# Patient Record
Sex: Female | Born: 1960 | Race: White | Hispanic: No | Marital: Married | Smoking: Current every day smoker
Health system: Southern US, Community
[De-identification: ages and names within clinical notes are randomized; demographics above are authoritative.]

## PROBLEM LIST (undated history)

## (undated) DIAGNOSIS — Z87442 Personal history of urinary calculi: Secondary | ICD-10-CM

## (undated) DIAGNOSIS — M1990 Unspecified osteoarthritis, unspecified site: Secondary | ICD-10-CM

## (undated) DIAGNOSIS — J45909 Unspecified asthma, uncomplicated: Secondary | ICD-10-CM

## (undated) DIAGNOSIS — N189 Chronic kidney disease, unspecified: Secondary | ICD-10-CM

## (undated) DIAGNOSIS — D649 Anemia, unspecified: Secondary | ICD-10-CM

## (undated) DIAGNOSIS — G8929 Other chronic pain: Secondary | ICD-10-CM

## (undated) HISTORY — DX: Other chronic pain: G89.29

## (undated) HISTORY — DX: Anemia, unspecified: D64.9

## (undated) HISTORY — PX: CHOLECYSTECTOMY: SHX55

## (undated) HISTORY — PX: OTHER SURGICAL HISTORY: SHX169

## (undated) HISTORY — DX: Unspecified osteoarthritis, unspecified site: M19.90

## (undated) HISTORY — PX: COLONOSCOPY: SHX174

## (undated) HISTORY — DX: Chronic kidney disease, unspecified: N18.9

---

## 1983-03-20 HISTORY — PX: OTHER SURGICAL HISTORY: SHX169

## 2004-06-15 ENCOUNTER — Emergency Department: Payer: Self-pay | Admitting: Emergency Medicine

## 2004-10-16 ENCOUNTER — Emergency Department: Payer: Self-pay | Admitting: Unknown Physician Specialty

## 2004-10-16 ENCOUNTER — Other Ambulatory Visit: Payer: Self-pay

## 2006-08-23 ENCOUNTER — Inpatient Hospital Stay: Payer: Self-pay | Admitting: Unknown Physician Specialty

## 2008-02-16 ENCOUNTER — Ambulatory Visit: Payer: Self-pay | Admitting: Family Medicine

## 2008-03-01 ENCOUNTER — Ambulatory Visit: Payer: Self-pay | Admitting: Family Medicine

## 2008-06-12 ENCOUNTER — Emergency Department: Payer: Self-pay | Admitting: Emergency Medicine

## 2010-08-11 ENCOUNTER — Ambulatory Visit: Payer: Self-pay | Admitting: Anesthesiology

## 2010-08-17 ENCOUNTER — Ambulatory Visit: Payer: Self-pay | Admitting: Orthopedic Surgery

## 2011-03-20 DIAGNOSIS — M5134 Other intervertebral disc degeneration, thoracic region: Secondary | ICD-10-CM | POA: Insufficient documentation

## 2011-11-22 ENCOUNTER — Ambulatory Visit: Payer: Self-pay | Admitting: Family Medicine

## 2011-12-14 ENCOUNTER — Ambulatory Visit: Payer: Self-pay | Admitting: Internal Medicine

## 2011-12-19 ENCOUNTER — Ambulatory Visit: Payer: Self-pay | Admitting: Internal Medicine

## 2011-12-24 ENCOUNTER — Ambulatory Visit: Payer: Self-pay | Admitting: Internal Medicine

## 2012-03-19 HISTORY — PX: CERVICAL FUSION: SHX112

## 2012-04-05 ENCOUNTER — Ambulatory Visit: Payer: Self-pay | Admitting: Family Medicine

## 2012-04-05 LAB — RAPID INFLUENZA A&B ANTIGENS

## 2013-07-22 ENCOUNTER — Ambulatory Visit: Payer: Self-pay | Admitting: Physician Assistant

## 2014-02-22 DIAGNOSIS — Z832 Family history of diseases of the blood and blood-forming organs and certain disorders involving the immune mechanism: Secondary | ICD-10-CM | POA: Insufficient documentation

## 2014-02-22 DIAGNOSIS — E7800 Pure hypercholesterolemia, unspecified: Secondary | ICD-10-CM | POA: Insufficient documentation

## 2014-03-19 DIAGNOSIS — G8929 Other chronic pain: Secondary | ICD-10-CM | POA: Insufficient documentation

## 2014-04-28 DIAGNOSIS — D72829 Elevated white blood cell count, unspecified: Secondary | ICD-10-CM | POA: Insufficient documentation

## 2015-08-26 DIAGNOSIS — F411 Generalized anxiety disorder: Secondary | ICD-10-CM | POA: Insufficient documentation

## 2015-08-26 DIAGNOSIS — G2581 Restless legs syndrome: Secondary | ICD-10-CM | POA: Insufficient documentation

## 2016-09-17 DIAGNOSIS — R7302 Impaired glucose tolerance (oral): Secondary | ICD-10-CM | POA: Insufficient documentation

## 2016-10-26 ENCOUNTER — Ambulatory Visit
Admission: RE | Admit: 2016-10-26 | Discharge: 2016-10-26 | Disposition: A | Discharge status: Home / Self Care | Source: Ambulatory Visit | Attending: Cardiology | Admitting: Cardiology

## 2016-10-26 ENCOUNTER — Other Ambulatory Visit: Payer: Self-pay | Admitting: Cardiology

## 2016-10-26 DIAGNOSIS — M7989 Other specified soft tissue disorders: Secondary | ICD-10-CM | POA: Insufficient documentation

## 2016-10-26 DIAGNOSIS — R079 Chest pain, unspecified: Secondary | ICD-10-CM

## 2016-10-26 DIAGNOSIS — I700 Atherosclerosis of aorta: Secondary | ICD-10-CM | POA: Insufficient documentation

## 2016-10-26 HISTORY — DX: Unspecified asthma, uncomplicated: J45.909

## 2016-10-26 MED ORDER — IOPAMIDOL (ISOVUE-370) INJECTION 76%
75.0000 mL | Freq: Once | INTRAVENOUS | Status: AC | PRN
  Administered 2016-10-26: 75 mL via INTRAVENOUS

## 2018-01-21 ENCOUNTER — Other Ambulatory Visit: Payer: Self-pay | Admitting: Physical Medicine and Rehabilitation

## 2018-01-21 DIAGNOSIS — M5030 Other cervical disc degeneration, unspecified cervical region: Secondary | ICD-10-CM

## 2018-02-11 ENCOUNTER — Ambulatory Visit
Admission: RE | Admit: 2018-02-11 | Discharge: 2018-02-11 | Disposition: A | Discharge status: Home / Self Care | Source: Ambulatory Visit | Attending: Physical Medicine and Rehabilitation | Admitting: Physical Medicine and Rehabilitation

## 2018-02-11 ENCOUNTER — Encounter (INDEPENDENT_AMBULATORY_CARE_PROVIDER_SITE_OTHER): Payer: Self-pay

## 2018-02-11 DIAGNOSIS — Z981 Arthrodesis status: Secondary | ICD-10-CM | POA: Insufficient documentation

## 2018-02-11 DIAGNOSIS — M5030 Other cervical disc degeneration, unspecified cervical region: Secondary | ICD-10-CM | POA: Insufficient documentation

## 2018-02-11 DIAGNOSIS — M4802 Spinal stenosis, cervical region: Secondary | ICD-10-CM | POA: Insufficient documentation

## 2018-02-11 DIAGNOSIS — M47812 Spondylosis without myelopathy or radiculopathy, cervical region: Secondary | ICD-10-CM | POA: Insufficient documentation

## 2018-02-11 DIAGNOSIS — M5412 Radiculopathy, cervical region: Secondary | ICD-10-CM | POA: Insufficient documentation

## 2018-08-02 IMAGING — CT CT ANGIO CHEST
2 of 6 series · 18 of 46 positions shown · IV contrast (APPLIED)
Comparison: Plain films 04/05/2012.  No prior CT.

CLINICAL DATA: Intermittent chest pain and shortness of breath for
1 month.

EXAM:
CT ANGIOGRAPHY CHEST WITH CONTRAST
TECHNIQUE: Multidetector CT imaging of the chest was performed using the
standard protocol during bolus administration of intravenous
contrast. Multiplanar CT image reconstructions and MIPs were
obtained to evaluate the vascular anatomy.
CONTRAST:  75 cc of Isovue 370

[Series 5: thins · axial · 0.74mm/px · z∈[-297,-65]mm · 16 of 256 slices shown]
[im 12/256  lung]
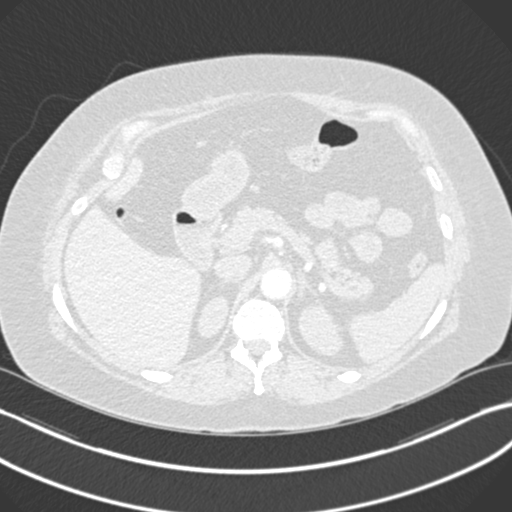
[im 34/256  soft-tissue]
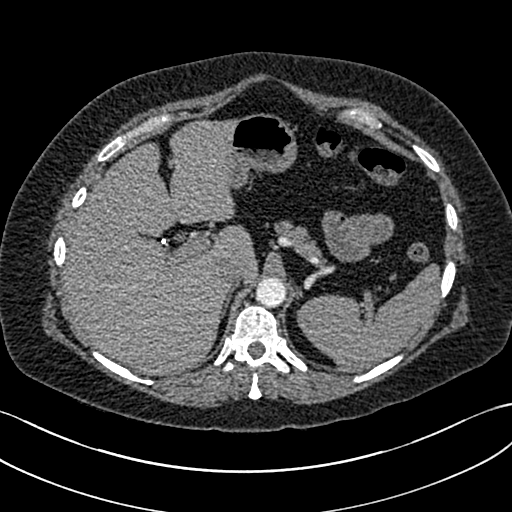
[im 45/256  lung]
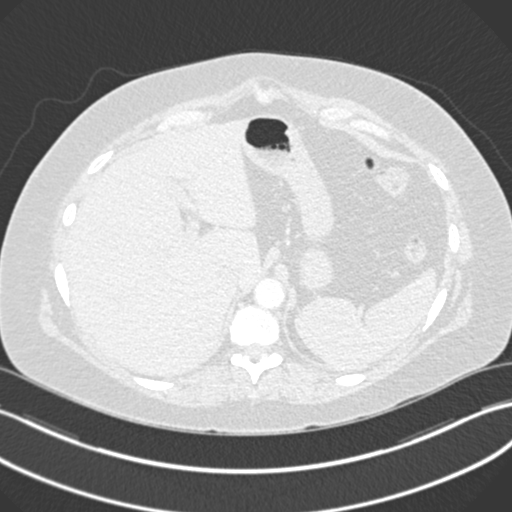
[im 56/256  soft-tissue]
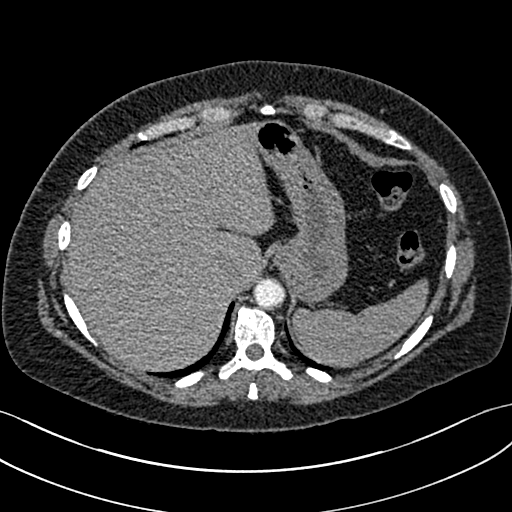
[im 78/256  lung]
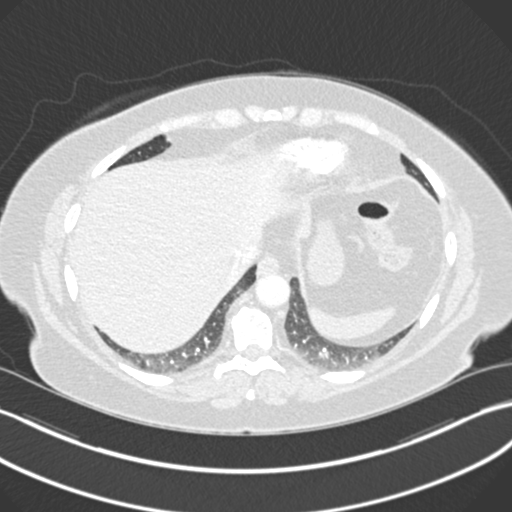
[im 89/256  soft-tissue]
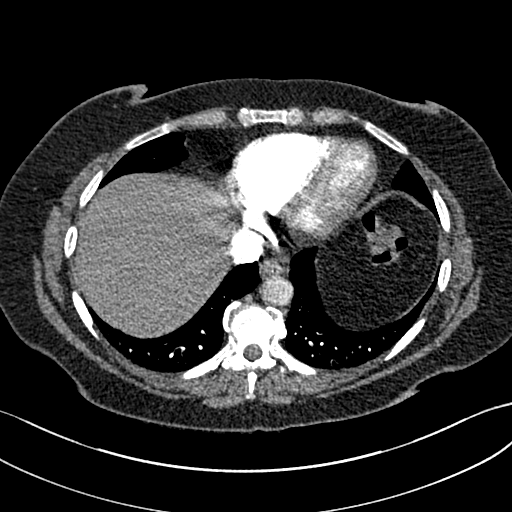
[im 100/256  lung]
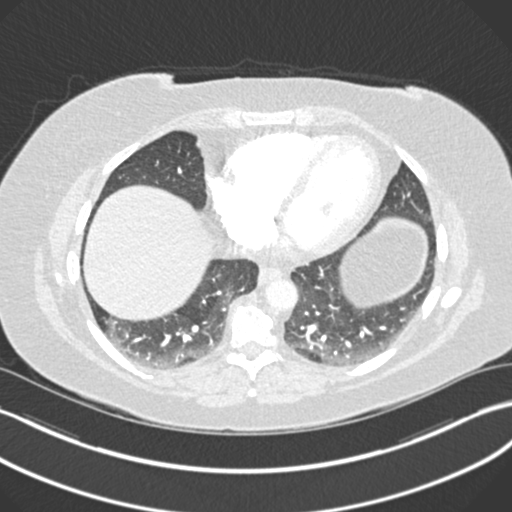
[im 122/256  soft-tissue]
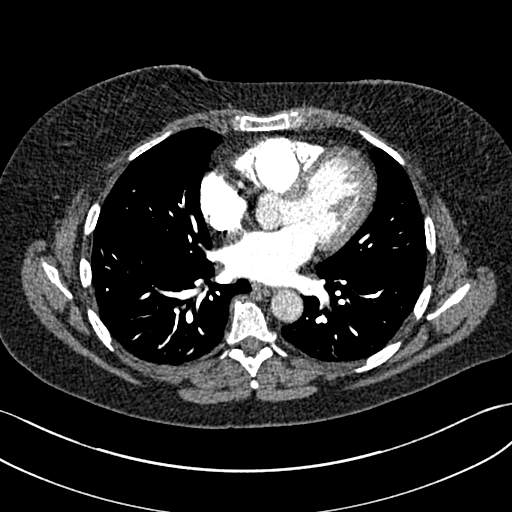
[im 134/256  lung]
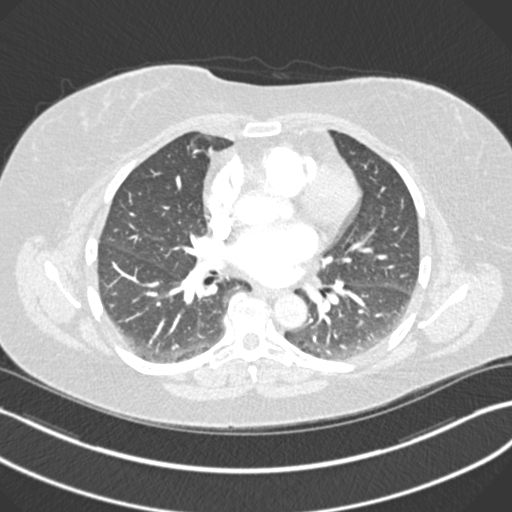
[im 156/256  soft-tissue]
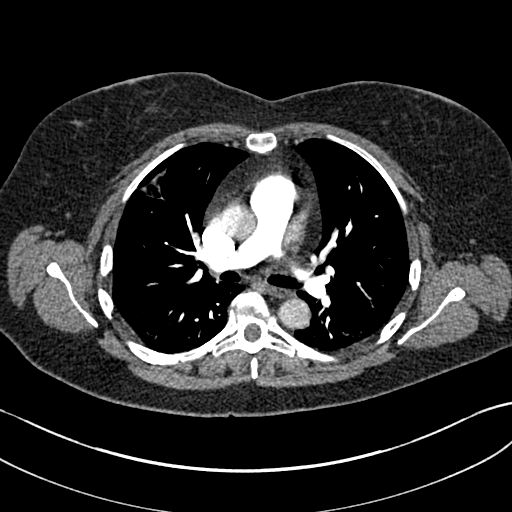
[im 167/256  lung]
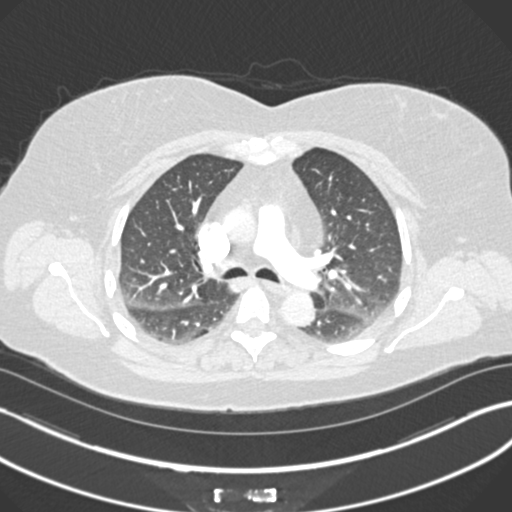
[im 178/256  soft-tissue]
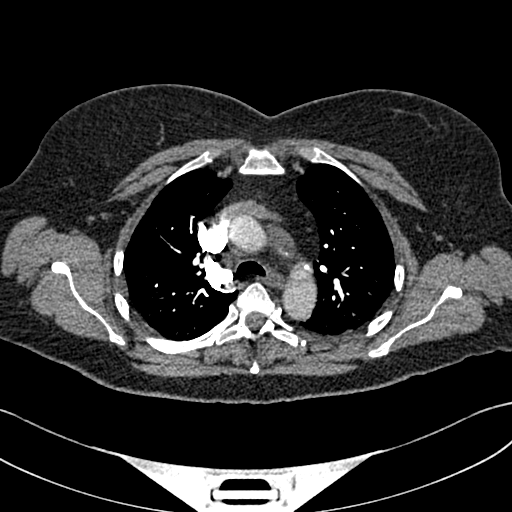
[im 200/256  lung]
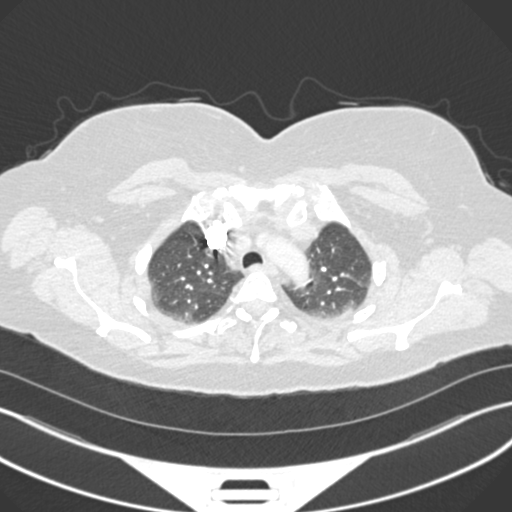
[im 211/256  soft-tissue]
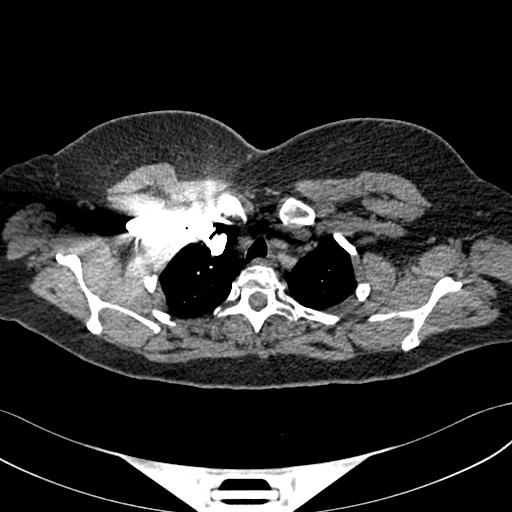
[im 222/256  lung]
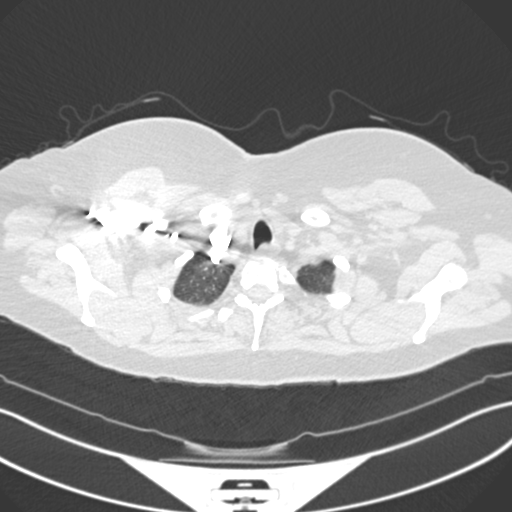
[im 244/256  soft-tissue]
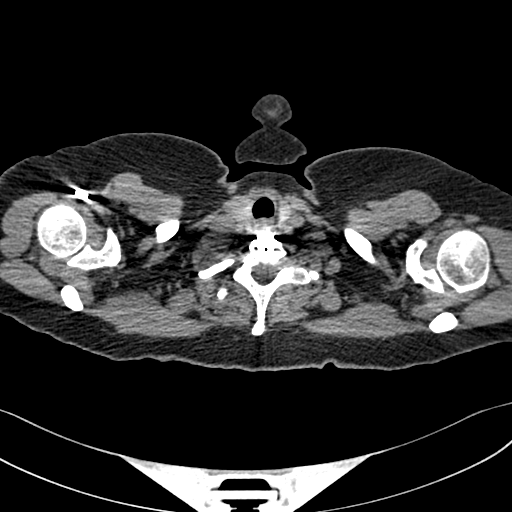

[Series 7: coronal mpr · coronal · 0.54mm/px · 2 of 87 slices shown]
[im 29/87  soft-tissue]
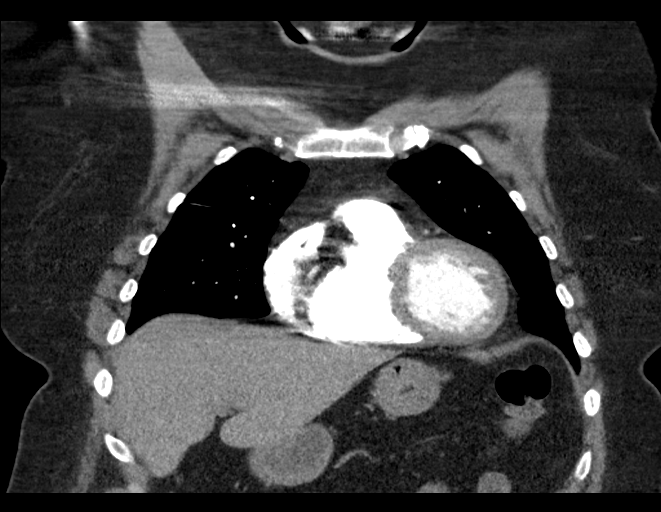
[im 58/87  soft-tissue]
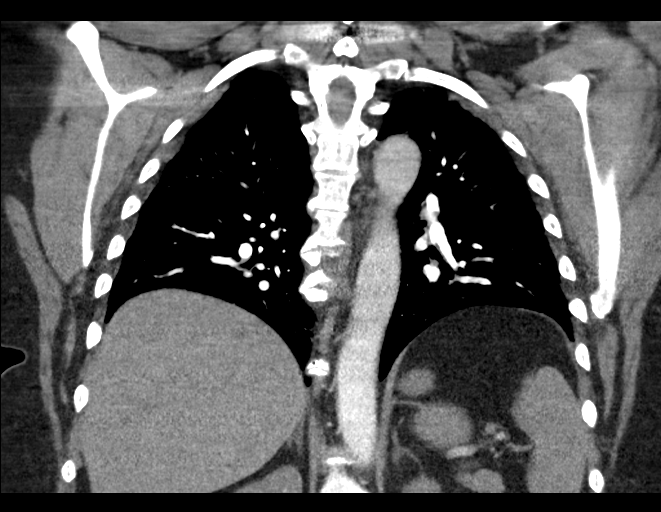

[18 of 46 positions shown; findings below may reference images not displayed]

FINDINGS: Cardiovascular: The quality of this exam for evaluation of pulmonary
embolism is good. No evidence of pulmonary embolism. Aortic
atherosclerosis. Tortuous thoracic aorta. Mild cardiomegaly, without
pericardial effusion.

Mediastinum/Nodes: No mediastinal or hilar adenopathy.

Lungs/Pleura: No pleural fluid. Dependent subsegmental atelectasis
within both lower lobes and the right upper lobe.

Upper Abdomen: Cholecystectomy. too small to characterize left
hepatic lobe lesion. Normal imaged portions of the spleen, stomach,
pancreas, adrenal glands, kidneys.

Musculoskeletal: Lower cervical spine fixation.

Review of the MIP images confirms the above findings.
IMPRESSION: 1.  No evidence of pulmonary embolism.
2.  Aortic Atherosclerosis ([id number]).
3. No explanation for patient's symptoms.
These results will be called to the ordering clinician or
representative by the [HOSPITAL] at the imaging location.

## 2018-09-10 ENCOUNTER — Other Ambulatory Visit: Payer: Self-pay | Admitting: Family Medicine

## 2018-09-10 DIAGNOSIS — M546 Pain in thoracic spine: Secondary | ICD-10-CM

## 2018-09-27 ENCOUNTER — Ambulatory Visit
Admission: RE | Admit: 2018-09-27 | Discharge: 2018-09-27 | Disposition: A | Discharge status: Home / Self Care | Source: Ambulatory Visit | Attending: Family Medicine | Admitting: Family Medicine

## 2018-09-27 ENCOUNTER — Other Ambulatory Visit: Payer: Self-pay

## 2018-09-27 DIAGNOSIS — M546 Pain in thoracic spine: Secondary | ICD-10-CM | POA: Diagnosis not present

## 2019-03-26 NOTE — Progress Notes (Signed)
Name: Leslie Carrillo  MRN: [id number]  Referring Provider: Denton Lank, FNP  DOB: [birth year]  PCP: Gracelyn Nurse, MD  DOS: 03/31/2019  Note by: Edward Jolly, MD  Service setting: Ambulatory outpatient  Specialty: Interventional Pain Management  Location: [facility] Virtual Visit  Visit type: Initial Patient Evaluation  Patient type: New Patient   Virtual Encounter - Pain Management PROVIDER NOTE: Information contained herein reflects review and annotations entered in association with encounter. Interpretation of such information and data should be left to medically-trained personnel. Information provided to patient can be located elsewhere in the medical record under "Patient Instructions". Document created using STT-dictation technology, any transcriptional errors that may result from process are unintentional.    Contact & Pharmacy Preferred: [phone] Home: [phone] (home) Mobile: [phone] (mobile) E-mail: No e-mail address on record  [id number] [place], [place] - [address] AT [facility] [address] [place] [address] Phone: [phone] Fax: [phone]   Pre-screening note:  Our staff contacted Leslie Carrillo and offered her an "in person", "face-to-face" appointment versus a telephone encounter. She indicated preferring the telephone encounter, at this time.  Primary Reason(s) for Visit: Tele-Encounter for initial evaluation of one or more chronic problems (new to examiner) potentially causing chronic pain, and posing a threat to normal musculoskeletal function. (Level of risk: High)  CC: neck pain, thoracic pain, hip pain, bilateral hip pain  I contacted Leslie Carrillo on 03/31/2019 via telephone.      I clearly identified myself as Edward Jolly, MD. I verified that I was speaking with the correct person using two identifiers (Name: Leslie Carrillo, and date of birth: 12/18/1960).  This visit was completed  via telephone due to the restrictions of the COVID-19 pandemic. All issues as above were discussed and addressed but no physical exam was performed. If it was felt that the patient should be evaluated in the office, they were directed there. The patient verbally consented to this visit. Patient was unable to complete an audio/visual visit due to Technical difficulties and/or Lack of internet. Due to the catastrophic nature of the COVID-19 pandemic, this visit was done through audio contact only.  Location of the patient: home address (see Epic for details)  Location of the provider: offic  Advanced Informed Consent I sought verbal advanced consent from Leslie Carrillo for virtual visit interactions. I informed Leslie Carrillo of possible security and privacy concerns, risks, and limitations associated with providing "not-in-person" medical evaluation and management services. I also informed Leslie Carrillo of the availability of "in-person" appointments. Finally, I informed her that there would be a charge for the virtual visit and that she could be  personally, fully or partially, financially responsible for it. Leslie Carrillo expressed understanding and agreed to proceed.   HPI  Leslie Carrillo is a 59 y.o. year old, female patient, contacted today for an initial evaluation of her chronic pain. She has Chronic low back pain; Chronic pain of left knee; DDD (degenerative disc disease), thoracic; GAD (generalized anxiety disorder); Restless leg syndrome; Cervical radicular pain; Cervical facet joint syndrome; Bilateral primary osteoarthritis of knee; Lumbar degenerative disc disease; Sacroiliac joint pain; and Chronic pain syndrome on their problem list.   Onset and Duration: Gradual and Present longer than 3 months Cause of pain: Trauma Severity: Getting worse, NAS-11 at its worse: 10/10, NAS-11 at its best: 5/10, NAS-11 now: 7/10 and NAS-11 on the average: 7/10 Timing: Afternoon,  During activity or exercise, After activity  or exercise and After a period of immobility Aggravating Factors: Climbing, Squatting, Twisting and Walking uphill Alleviating Factors: Medications Associated Problems: Dizziness, Numbness, Swelling and Tingling Quality of Pain: sore Previous Examinations or Tests: MRI scan, X-rays and Orthopedic evaluation Previous Treatments: Biofeedback, Narcotic medications, Physical Therapy, Steroid treatments by mouth and TENS  Patient is a pleasant 59 year old female who presents with a chief complaint of mid back pain, neck pain, bilateral hip pain, left greater than right knee pain.  She is being referred from [place].  There she was being treated for similar pain complaints primarily her mid back pain.  This was thought to be related to thoracic radiculopathy and associated radicular pain.  She did have right T11-T12 thoracic ESI with moderate relief for the first 1 and minimal relief with the second 1.  Valsalva maneuvers cause her pain to increase which suggests a discogenic cause of pain.  She states that her pain has been more difficult to manage and that her current medications are no longer effective.  She has tried gabapentin and Lyrica in the past.  Gabapentin resulted in cognitive side effects and Lyrica resulted in palpitations.  She has not tried Cymbalta but the patient is on Zoloft so may want to avoid the context of risk of serotonin syndrome.  She is on Robaxin as a muscle relaxer for her cervical spinal muscle spasms.  She has a history of C5-C6 and C6-C7 spinal fusion in 2014 due to injury at [facility] when she fell backwards.  She has tried injections as detailed below, physical therapy at [facility] which was not very effective along with various pharmacologic trials with limiting or no significant benefit.  Procedural history at [facility] (Dr Yves Dill): 12/15/2018: Right T11-12 transforaminal ESI (10% relief) 10/23/2018: Right T11-12 transforaminal ESI (moderate to good relief) 10/06/2018:  Right T10 trigger point injection (moderate relief) 04/14/2018: Right C4-5 transforaminal ESI (no relief) 03/17/2018: Right C5-6 transforaminal ESI (no relief)  Historic Controlled Substance Pharmacotherapy Review  Current opioid analgesics:  Tramadol 50 mg twice daily MME/day: 10 mg/day   Historical Monitoring: The patient  has no history on file for drug. List of all UDS Test(s): No results found for: MDMA, COCAINSCRNUR, PCPSCRNUR, PCPQUANT, CANNABQUANT, THCU, ETH List of other Serum/Urine Drug Screening Test(s):  No results found for: AMPHSCRSER, BARBSCRSER, BENZOSCRSER, COCAINSCRSER, COCAINSCRNUR, PCPSCRSER, PCPQUANT, THCSCRSER, THCU, CANNABQUANT, OPIATESCRSER, OXYSCRSER, PROPOXSCRSER, ETH Historical Background Evaluation: [place] PMP: PDMP reviewed during this encounter. Two (2) year initial data search conducted.               Pharmacologic Plan: As per protocol, I have not taken over any controlled substance management, pending the results of ordered tests and/or consults.            Initial impression: Pending review of available data and ordered tests.  Meds   Current Outpatient Medications:  .  Acetaminophen (TYLENOL PO), Take 500 mg by mouth as needed., Disp: , Rfl:  .  methocarbamol (ROBAXIN) 500 MG tablet, Take 500 mg by mouth at bedtime as needed for muscle spasms., Disp: , Rfl:  .  sertraline (ZOLOFT) 100 MG tablet, Take 100 mg by mouth daily., Disp: , Rfl:  .  traMADol (ULTRAM) 50 MG tablet, Take by mouth 2 (two) times daily., Disp: , Rfl:   ROS  Cardiovascular: Abnormal heart rhythm Pulmonary or Respiratory: Smoking Neurological: No reported neurological signs or symptoms such as seizures, abnormal skin sensations, urinary and/or fecal incontinence, being  born with an abnormal open spine and/or a tethered spinal cord Psychological-Psychiatric: Depressed Gastrointestinal: Heartburn due to stomach pushing into lungs (Hiatal hernia) Genitourinary: Kidney  disease Hematological: Weakness due to low blood hemoglobin or red blood cell count (Anemia) and Bleeding easily Endocrine: No reported endocrine signs or symptoms such as high or low blood sugar, rapid heart rate due to high thyroid levels, obesity or weight gain due to slow thyroid or thyroid disease Rheumatologic: Joint aches and or swelling due to excess weight (Osteoarthritis) Musculoskeletal: Negative for myasthenia gravis, muscular dystrophy, multiple sclerosis or malignant hyperthermia Work History: Disabled  Allergies  Leslie Carrillo is allergic to codeine.  See care everywhere, lab results  Imaging Review  Cervical Imaging: Cervical MR wo contrast:  Results for orders placed during the hospital encounter of 02/11/18  MR CERVICAL SPINE WO CONTRAST   Narrative CLINICAL DATA:  History of cervical fusion in 2014. Neck pain, headache, bilateral arm pain, numbness and tingling. Right arm weakness.  EXAM: MRI CERVICAL SPINE WITHOUT CONTRAST  TECHNIQUE: Multiplanar, multisequence MR imaging of the cervical spine was performed. No intravenous contrast was administered.  COMPARISON:  Report of cervical spine MRI 02/11/2018 reviewed. Images are not available.  FINDINGS: Alignment: Maintained.  Vertebrae: No fracture or worrisome lesion. Artifact from C5-7 anterior fusion hardware noted.  Cord: Normal signal throughout.  Posterior Fossa, vertebral arteries, paraspinal tissues: Negative.  Disc levels:  C2-3: Negative.  C3-4: Mild uncovertebral disease on the left. The central canal and foramina are open.  C4-5: Minimal disc bulge without stenosis. There is some facet degenerative change on the right with minimal marrow edema about the facets.  C5-6: Status post discectomy and fusion. The central canal and foramina are open.  C6-7: Status post discectomy and fusion. The central canal and foramina are open.  C7-T1: Negative.  IMPRESSION: Negative for central canal  or foraminal narrowing.  Facet degenerative change on the right at C4-5 with minimal marrow edema about the joint.  Status post C5-7 ACDF without evidence of complication.   Electronically Signed   By: Drusilla Kannerhomas  Dalessio M.D.   On: 02/11/2018 12:22      Thoracic Imaging: Thoracic MR wo contrast:  Results for orders placed during the hospital encounter of 09/27/18  MR THORACIC SPINE WO CONTRAST   Narrative CLINICAL DATA:  Initial evaluation for worsening right-sided back pain, right shoulder and arm pain.  EXAM: MRI THORACIC SPINE WITHOUT CONTRAST  TECHNIQUE: Multiplanar, multisequence MR imaging of the thoracic spine was performed. No intravenous contrast was administered.  COMPARISON:  None.  FINDINGS: Alignment: Mild roto dextroscoliosis. Trace anterolisthesis of T8 on T9, chronic and facet mediated. Alignment otherwise normal with preservation of the normal thoracic kyphosis. No listhesis.  Vertebrae: Vertebral body height maintained without evidence for acute or chronic fracture. Bone marrow signal intensity within normal limits. Multiple scattered benign hemangiomas noted, most prominent of which measures 11 mm within the T10 vertebral body. No worrisome osseous lesions. Increased STIR signal intensity within the left transverse process of T5 favored to reflect an atypical hemangioma. No other abnormal marrow edema.  Cord: Signal intensity within the thoracic spinal cord is normal. Normal cord caliber and morphology.  Paraspinal and other soft tissues: Paraspinous soft tissues within normal limits. Visualized visceral structures demonstrate no significant finding.  Disc levels:  T1-2:  Unremarkable.  T2-3: Unremarkable.  T3-4:  Unremarkable.  T4-5:  Unremarkable.  T5-6:  Unremarkable.  T6-7: Small right paracentral disc protrusion indents the right ventral thecal sac (series 22,  image 20). No significant stenosis or cord impingement.  T7-8: Tiny  right paracentral disc protrusion minimally indents the ventral thecal sac (series 22, image 23). No significant stenosis or cord impingement.  T8-9: Trace anterolisthesis. Negative interspace. Posterior element hypertrophy. No significant stenosis.  T9-10: Posterior element hypertrophy.  No significant stenosis.  T10-11: Negative interspace. Left greater than right facet hypertrophy. No significant stenosis.  T11-12: Mild diffuse disc bulge with superimposed shallow right paracentral disc protrusion (series 22, image 35). No significant stenosis or cord impingement. Mild facet hypertrophy. Foramina remain patent.  T12-L1:  Unremarkable.  IMPRESSION: 1. Small right paracentral disc protrusions at T6-7, T7-8, and T11-12 without significant stenosis or cord impingement. Findings could contribute to right-sided back pain. 2. Trace chronic facet mediated anterolisthesis of T8 on T9. 3. Multilevel facet hypertrophy throughout the mid and lower thoracic spine, most pronounced at T8-9 on the right.   Electronically Signed   By: Jeannine Boga M.D.   On: 09/28/2018 05:51     Complexity Note: Imaging results reviewed. Results shared with Leslie Carrillo, using Layman's terms.                         PFSH  Drug: Leslie Carrillo  has no history on file for drug. Alcohol:  has no history on file for alcohol. Tobacco:  has no history on file for tobacco. Medical:  has a past medical history of Anemia, Asthma, and Chronic kidney disease. Family: family history is not on file. Active Ambulatory Problems    Diagnosis Date Noted  . Chronic low back pain 03/20/2011  . Chronic pain of left knee 03/19/2014  . DDD (degenerative disc disease), thoracic 03/20/2011  . GAD (generalized anxiety disorder) 08/26/2015  . Restless leg syndrome 08/26/2015  . Cervical radicular pain 03/31/2019  . Cervical facet joint syndrome 03/31/2019  . Bilateral primary osteoarthritis of knee 03/31/2019  . Lumbar  degenerative disc disease 03/31/2019  . Sacroiliac joint pain 03/31/2019  . Chronic pain syndrome 03/31/2019   Resolved Ambulatory Problems    Diagnosis Date Noted  . No Resolved Ambulatory Problems   Past Medical History:  Diagnosis Date  . Anemia   . Asthma   . Chronic kidney disease    Assessment  Primary Diagnosis & Pertinent Problem List: The primary encounter diagnosis was Cervical radicular pain. Diagnoses of Cervical facet joint syndrome, Bilateral primary osteoarthritis of knee, Lumbar degenerative disc disease, DDD (degenerative disc disease), thoracic, Sacroiliac joint pain, Chronic pain of left knee, and Chronic pain syndrome were also pertinent to this visit.  Visit Diagnosis (New problems to examiner): 1. Cervical radicular pain   2. Cervical facet joint syndrome   3. Bilateral primary osteoarthritis of knee   4. Lumbar degenerative disc disease   5. DDD (degenerative disc disease), thoracic   6. Sacroiliac joint pain   7. Chronic pain of left knee   8. Chronic pain syndrome    Plan of Care (Initial workup plan)  Note: Leslie Carrillo was reminded that as per protocol, today's visit has been an evaluation only. We have not taken over the patient's controlled substance management.  General Recommendations: The pain condition that the patient suffers from is best treated with a multidisciplinary approach that involves an increase in physical activity to prevent de-conditioning and worsening of the pain cycle, as well as psychological counseling (formal and/or informal) to address the co-morbid psychological affects of pain. Treatment will often involve judicious use of pain medications and  interventional procedures to decrease the pain, allowing the patient to participate in the physical activity that will ultimately produce long-lasting pain reductions. The goal of the multidisciplinary approach is to return the patient to a higher level of overall function and to restore their  ability to perform activities of daily living.  1.  Spent an extensive amount of time discussing goals of chronic pain management.  We will focus on interventional pain management and medication management.  Patient has tried physical therapy in the past at [facility] which was not very effective. 2.  Has tried and failed gabapentin and Lyrica.  Gabapentin resulted in cognitive side effects, Lyrica resulted in palpitations.  Can consider Cymbalta in future but patient is on Zoloft so may need to titrate off that. 3.  Referral to pain psych and urine toxicology screen as below which is customary for new patients before they are considered for chronic opioid management. 4.  X-rays as below.   Lab Orders     UDS (Comprehensive-24) ([facility]) ([facility]) (New Pt.)  Imaging Orders     DG Lumbar Spine Complete w/ Flex/Ext (6 Views)     DG Sacroiliac Joint X-Rays     DG Knee (Right)     DG Knee (Left)     DG Hip (Left)     DG Hip (Right)  Referral Orders     SUD Evaluation (Med.Psych.)  Pharmacological management options:  Opioid Analgesics: The patient was informed that there is no guarantee that she would be a candidate for opioid analgesics. The decision will be made following CDC guidelines. This decision will be based on the results of diagnostic studies, as well as Leslie Carrillo's risk profile.  Consider Tramadol 100 mg BID  Membrane stabilizer: Tried and failed Lyrica (palpitations), and Gabapentin (cognitive side effects)  Muscle relaxant: Robaxin  NSAID: avoid   Other analgesic(s): To be determined at a later time   Interventional management options: Leslie Carrillo was informed that there is no guarantee that she would be a candidate for interventional therapies. The decision will be based on the results of diagnostic studies, as well as Leslie Carrillo's risk profile.  Procedure(s) under consideration:  Knee steroid injection Knee hyalgan Genicular NB, RFA SI-J    Provider-requested follow-up:  Return for pt will call for 2nd pt visit , After Imaging, After Psychological evaluation.  No future appointments.  Total duration of non-face-to-face encounter:45 minutes.  Primary Care Physician: Gracelyn Nurse, MD Location: [facility] Note by: Edward Jolly, MD Date: 03/31/2019; Time: 10:00 AM  Note: This dictation was prepared with Dragon dictation. Any transcriptional errors that may result from this process are unintentional.

## 2019-03-30 ENCOUNTER — Encounter: Payer: Self-pay | Admitting: Student in an Organized Health Care Education/Training Program

## 2019-03-31 ENCOUNTER — Ambulatory Visit
Admission: RE | Admit: 2019-03-31 | Discharge: 2019-03-31 | Disposition: A | Discharge status: Home / Self Care | Source: Ambulatory Visit | Attending: Student in an Organized Health Care Education/Training Program | Admitting: Student in an Organized Health Care Education/Training Program

## 2019-03-31 ENCOUNTER — Encounter: Payer: Self-pay | Admitting: Student in an Organized Health Care Education/Training Program

## 2019-03-31 ENCOUNTER — Other Ambulatory Visit: Payer: Self-pay

## 2019-03-31 ENCOUNTER — Ambulatory Visit
Attending: Student in an Organized Health Care Education/Training Program | Admitting: Student in an Organized Health Care Education/Training Program

## 2019-03-31 ENCOUNTER — Telehealth: Payer: Self-pay | Admitting: *Deleted

## 2019-03-31 DIAGNOSIS — M533 Sacrococcygeal disorders, not elsewhere classified: Secondary | ICD-10-CM | POA: Insufficient documentation

## 2019-03-31 DIAGNOSIS — G8929 Other chronic pain: Secondary | ICD-10-CM | POA: Insufficient documentation

## 2019-03-31 DIAGNOSIS — G894 Chronic pain syndrome: Secondary | ICD-10-CM | POA: Insufficient documentation

## 2019-03-31 DIAGNOSIS — M5416 Radiculopathy, lumbar region: Secondary | ICD-10-CM | POA: Insufficient documentation

## 2019-03-31 DIAGNOSIS — M25562 Pain in left knee: Secondary | ICD-10-CM

## 2019-03-31 DIAGNOSIS — M47812 Spondylosis without myelopathy or radiculopathy, cervical region: Secondary | ICD-10-CM | POA: Insufficient documentation

## 2019-03-31 DIAGNOSIS — M5136 Other intervertebral disc degeneration, lumbar region: Secondary | ICD-10-CM | POA: Diagnosis not present

## 2019-03-31 DIAGNOSIS — M5412 Radiculopathy, cervical region: Secondary | ICD-10-CM

## 2019-03-31 DIAGNOSIS — M170 Bilateral primary osteoarthritis of knee: Secondary | ICD-10-CM | POA: Diagnosis not present

## 2019-03-31 DIAGNOSIS — M5134 Other intervertebral disc degeneration, thoracic region: Secondary | ICD-10-CM | POA: Diagnosis not present

## 2019-03-31 DIAGNOSIS — M4722 Other spondylosis with radiculopathy, cervical region: Secondary | ICD-10-CM | POA: Diagnosis not present

## 2019-04-03 LAB — COMPLIANCE DRUG ANALYSIS, UR

## 2019-05-06 ENCOUNTER — Telehealth: Payer: Self-pay

## 2019-05-06 NOTE — Telephone Encounter (Signed)
She brought over papers yesterday that Dr. Cherylann Ratel wanted her to complete and drop off. I put them in Dr. Laurey Morale mail holder outside of his office. She called today and said she missed 2 pages and will mail them to [place].

## 2019-05-11 ENCOUNTER — Encounter: Payer: Self-pay | Admitting: Student in an Organized Health Care Education/Training Program

## 2019-05-12 ENCOUNTER — Other Ambulatory Visit: Payer: Self-pay

## 2019-05-12 ENCOUNTER — Ambulatory Visit
Attending: Student in an Organized Health Care Education/Training Program | Admitting: Student in an Organized Health Care Education/Training Program

## 2019-05-12 DIAGNOSIS — M4726 Other spondylosis with radiculopathy, lumbar region: Secondary | ICD-10-CM | POA: Diagnosis not present

## 2019-05-12 DIAGNOSIS — M170 Bilateral primary osteoarthritis of knee: Secondary | ICD-10-CM

## 2019-05-12 DIAGNOSIS — M7918 Myalgia, other site: Secondary | ICD-10-CM | POA: Diagnosis not present

## 2019-05-12 DIAGNOSIS — G8929 Other chronic pain: Secondary | ICD-10-CM

## 2019-05-12 DIAGNOSIS — M5136 Other intervertebral disc degeneration, lumbar region: Secondary | ICD-10-CM

## 2019-05-12 DIAGNOSIS — G894 Chronic pain syndrome: Secondary | ICD-10-CM

## 2019-05-12 DIAGNOSIS — M47816 Spondylosis without myelopathy or radiculopathy, lumbar region: Secondary | ICD-10-CM | POA: Insufficient documentation

## 2019-05-12 DIAGNOSIS — M47812 Spondylosis without myelopathy or radiculopathy, cervical region: Secondary | ICD-10-CM

## 2019-05-12 MED ORDER — METHOCARBAMOL 500 MG PO TABS: 1000.0000 mg | ORAL_TABLET | Freq: Every evening | ORAL | 0 refills | Status: DC | PRN

## 2019-05-12 MED ORDER — TRAMADOL HCL 50 MG PO TABS: 100.0000 mg | ORAL_TABLET | Freq: Two times a day (BID) | ORAL | 1 refills | Status: AC | PRN

## 2019-05-12 NOTE — Progress Notes (Signed)
Patient: Leslie Carrillo  Service Category: E/M  Provider: Gillis Santa, MD  DOB: Jun 17, 1960  DOS: 05/12/2019  Location: Office  MRN: [id number]  Setting: Ambulatory outpatient  Referring Provider: Baxter Hire, MD  Type: Established Patient  Specialty: Interventional Pain Management  PCP: Baxter Hire, MD  Location: Home  Delivery: TeleHealth     Virtual Encounter - Pain Management PROVIDER NOTE: Information contained herein reflects review and annotations entered in association with encounter. Interpretation of such information and data should be left to medically-trained personnel. Information provided to patient can be located elsewhere in the medical record under "Patient Instructions". Document created using STT-dictation technology, any transcriptional errors that may result from process are unintentional.    Contact & Pharmacy Preferred: [phone] Home: [phone] (home) Mobile: [phone] (mobile) E-mail: [email or web address]  [id number] [address], [address] [place] [address] Phone: [phone] Fax: [phone]   Pre-screening  Leslie Carrillo offered "in-person" vs "virtual" encounter. She indicated preferring virtual for this encounter.   Reason COVID-19*  Social distancing based on CDC and AMA recommendations.   I contacted Leslie Carrillo on 05/12/2019 via telephone.      I clearly identified myself as Gillis Santa, MD. I verified that I was speaking with the correct person using two identifiers (Name: ZINIA INNOCENT, and date of birth: [birth year]).  This visit was completed via telephone due to the restrictions of the COVID-19 pandemic. All issues as above were discussed and addressed but no physical exam was performed. If it was felt that the patient should be evaluated in the office, they were directed there. The patient verbally consented to this visit. Patient was unable to  complete an audio/visual visit due to Technical difficulties and/or Lack of internet. Due to the catastrophic nature of the COVID-19 pandemic, this visit was done through audio contact only.  Location of the patient: home address (see Epic for details)  Location of the provider: office  Consent I sought verbal advanced consent from Leslie Carrillo for virtual visit interactions. I informed Leslie Carrillo of possible security and privacy concerns, risks, and limitations associated with providing "not-in-person" medical evaluation and management services. I also informed Leslie Carrillo of the availability of "in-person" appointments. Finally, I informed her that there would be a charge for the virtual visit and that she could be  personally, fully or partially, financially responsible for it. Leslie Carrillo expressed understanding and agreed to proceed.   Historic Elements   Leslie Carrillo is a 59 y.o. year old, female patient evaluated today after her last contact with our practice on 05/06/2019. Leslie Carrillo  has a past medical history of Anemia, Asthma, and Chronic kidney disease. She also  has no past surgical history on file. Leslie Carrillo has a current medication list which includes the following prescription(s): acetaminophen, sertraline, methocarbamol, and tramadol. She  reports that she has been smoking. She has never used smokeless tobacco. No history on file for alcohol and drug. Leslie Carrillo is allergic to codeine.   HPI  Today, she is being contacted for to review xrays and take over medication management of Tramadol   Patient endorsing knot in between her shoulder blade region. Could be muscular trigger point.  Patient denies having tried trigger point injection in the past.  She also endorses low back and hip pain.  This is likely related to lumbar facet arthropathy and  lumbar facet disease at L3-L4 and L4-L5 is evident on her lumbar spine x-ray.  Patient's bilateral knee x-rays show mild to moderate  osteoarthritis.  She has tried knee steroid injection once in the past which was beneficial.  She has completed her urine toxicology screen which is appropriate.  She is going through a pain flare at this time in her mid thoracic region and finds it very difficult to perform ADLs and is extremely exhausted by the end of the day from her pain.  We will increase to 100 mg twice daily for the time being.  Also recommend increasing Robaxin to 1000 mg nightly as needed  Pharmacotherapy Assessment  Analgesic: Tramadol 50 mg twice daily MME/day: 10 mg/day   We will increase 100 mg twice daily  Monitoring: [place] PMP: PDMP reviewed during this encounter.       Pharmacotherapy: No side-effects or adverse reactions reported. Compliance: No problems identified. Effectiveness: Clinically acceptable. Plan: Refer to "POC".  UDS:  Summary  Date Value Ref Range Status  03/31/2019 Note  Final    Comment:    ==================================================================== Compliance Drug Analysis, Ur ==================================================================== Test                             Result       Flag       Units Drug Present and Declared for Prescription Verification   Tramadol                       >7042        EXPECTED   ng/mg creat   O-Desmethyltramadol            >7042        EXPECTED   ng/mg creat   N-Desmethyltramadol            2638         EXPECTED   ng/mg creat    Source of tramadol is a prescription medication. O-desmethyltramadol    and N-desmethyltramadol are expected metabolites of tramadol.   Sertraline                     PRESENT      EXPECTED   Desmethylsertraline            PRESENT      EXPECTED    Desmethylsertraline is an expected metabolite of sertraline.   Acetaminophen                  PRESENT      EXPECTED Drug Absent but Declared for Prescription Verification   Methocarbamol                  Not Detected  UNEXPECTED ==================================================================== Test                      Result    Flag   Units      Ref Range   Creatinine              71               mg/dL      >=20 ==================================================================== Declared Medications:  The flagging and interpretation on this report are based on the  following declared medications.  Unexpected results may arise from  inaccuracies in the declared medications.  **Note: The testing scope of this panel includes these medications:  Methocarbamol  Sertraline  Tramadol  **Note: The testing scope of this panel does not include small to  moderate amounts of these reported medications:  Acetaminophen ==================================================================== For clinical consultation, please call [phone]. ====================================================================    Laboratory Chemistry Profile   Renal No results found for: BUN, CREATININE, LABCREA, BCR, GFR, GFRAA, GFRNONAA, LABVMA, EPIRU, EPINEPH24HUR, NOREPRU, NOREPI24HUR, DOPARU, [id number]  Hepatic No results found for: AST, ALT, ALBUMIN, ALKPHOS, HCVAB, AMYLASE, LIPASE, AMMONIA  Electrolytes No results found for: NA, K, CL, CALCIUM, MG, PHOS  Bone No results found for: VD25OH, LY650PT4SFK, CL2751ZG0, FV4944HQ7, 25OHVITD1, 25OHVITD2, 25OHVITD3, TESTOFREE, TESTOSTERONE  Inflammation (CRP: Acute Phase) (ESR: Chronic Phase) No results found for: CRP, ESRSEDRATE, LATICACIDVEN    Note: Above Lab results reviewed.  Imaging  DG Hip (Right) CLINICAL DATA:  Low back pain extending into the hips bilaterally.  EXAM: RIGHT KNEE - 1-2 VIEW; LEFT KNEE - 1-2 VIEW; DG HIP (WITH OR WITHOUT PELVIS) 2-3V LEFT; LUMBAR SPINE - COMPLETE WITH BENDING VIEWS; DG HIP (WITH OR WITHOUT PELVIS) 2-3V RIGHT  COMPARISON:  MRI lumbar spine 01/09/2012  FINDINGS: Non rib-bearing lumbar type vertebral bodies are  present. Progressive levoconvex curvature of the lumbar spine is centered at L2-3. Compensatory rightward curvature is present at L5. Slight retrolisthesis is present at L2-3 and L3-4. There is slight anterolisthesis at L4-5. Marked facet hypertrophy is present from L3-4 through L5-S1.  Atherosclerotic calcifications are present in the aorta.  There is no significant change in listhesis associated with flexion or extension.  IMPRESSION: 1. 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 2. Retrolisthesis at L3-4 and anterolisthesis at L4-5 is new. 3. Significant facet hypertrophy at L3-4 and L4-5 in particular may contribute to radicular symptoms.  Electronically Signed   By: San Morelle M.D.   On: 04/01/2019 06:02 DG Hip (Left) CLINICAL DATA:  Low back pain extending into the hips bilaterally.  EXAM: RIGHT KNEE - 1-2 VIEW; LEFT KNEE - 1-2 VIEW; DG HIP (WITH OR WITHOUT PELVIS) 2-3V LEFT; LUMBAR SPINE - COMPLETE WITH BENDING VIEWS; DG HIP (WITH OR WITHOUT PELVIS) 2-3V RIGHT  COMPARISON:  MRI lumbar spine 01/09/2012  FINDINGS: Non rib-bearing lumbar type vertebral bodies are present. Progressive levoconvex curvature of the lumbar spine is centered at L2-3. Compensatory rightward curvature is present at L5. Slight retrolisthesis is present at L2-3 and L3-4. There is slight anterolisthesis at L4-5. Marked facet hypertrophy is present from L3-4 through L5-S1.  Atherosclerotic calcifications are present in the aorta.  There is no significant change in listhesis associated with flexion or extension.  IMPRESSION: 1. 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 2. Retrolisthesis at L3-4 and anterolisthesis at L4-5 is new. 3. Significant facet hypertrophy at L3-4 and L4-5 in particular may contribute to radicular symptoms.  Electronically Signed   By: San Morelle M.D.   On: 04/01/2019 06:02 DG Knee (Left) CLINICAL DATA:  Low back pain  extending into the hips bilaterally.  EXAM: RIGHT KNEE - 1-2 VIEW; LEFT KNEE - 1-2 VIEW; DG HIP (WITH OR WITHOUT PELVIS) 2-3V LEFT; LUMBAR SPINE - COMPLETE WITH BENDING VIEWS; DG HIP (WITH OR WITHOUT PELVIS) 2-3V RIGHT  COMPARISON:  MRI lumbar spine 01/09/2012  FINDINGS: Non rib-bearing lumbar type vertebral bodies are present. Progressive levoconvex curvature of the lumbar spine is centered at L2-3. Compensatory rightward curvature is present at L5. Slight retrolisthesis is present at L2-3 and L3-4. There is slight anterolisthesis at L4-5. Marked facet hypertrophy is present from L3-4 through L5-S1.  Atherosclerotic calcifications are present in the aorta.  There is no significant change in listhesis associated with flexion or extension.  IMPRESSION: 1. 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 2. Retrolisthesis at L3-4 and anterolisthesis at L4-5 is new. 3. Significant facet hypertrophy at L3-4 and L4-5 in particular may contribute to radicular symptoms.  Electronically Signed   By: San Morelle M.D.   On: 04/01/2019 06:02 DG Lumbar Spine Complete w/ Flex/Ext (6 Views) CLINICAL DATA:  Low back pain extending into the hips bilaterally.  EXAM: RIGHT KNEE - 1-2 VIEW; LEFT KNEE - 1-2 VIEW; DG HIP (WITH OR WITHOUT PELVIS) 2-3V LEFT; LUMBAR SPINE - COMPLETE WITH BENDING VIEWS; DG HIP (WITH OR WITHOUT PELVIS) 2-3V RIGHT  COMPARISON:  MRI lumbar spine 01/09/2012  FINDINGS: Non rib-bearing lumbar type vertebral bodies are present. Progressive levoconvex curvature of the lumbar spine is centered at L2-3. Compensatory rightward curvature is present at L5. Slight retrolisthesis is present at L2-3 and L3-4. There is slight anterolisthesis at L4-5. Marked facet hypertrophy is present from L3-4 through L5-S1.  Atherosclerotic calcifications are present in the aorta.  There is no significant change in listhesis associated with flexion or  extension.  IMPRESSION: 1. 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 2. Retrolisthesis at L3-4 and anterolisthesis at L4-5 is new. 3. Significant facet hypertrophy at L3-4 and L4-5 in particular may contribute to radicular symptoms.  Electronically Signed   By: San Morelle M.D.   On: 04/01/2019 06:02 DG Knee (Right) CLINICAL DATA:  Low back pain extending into the hips bilaterally.  EXAM: RIGHT KNEE - 1-2 VIEW; LEFT KNEE - 1-2 VIEW; DG HIP (WITH OR WITHOUT PELVIS) 2-3V LEFT; LUMBAR SPINE - COMPLETE WITH BENDING VIEWS; DG HIP (WITH OR WITHOUT PELVIS) 2-3V RIGHT  COMPARISON:  MRI lumbar spine 01/09/2012  FINDINGS: Non rib-bearing lumbar type vertebral bodies are present. Progressive levoconvex curvature of the lumbar spine is centered at L2-3. Compensatory rightward curvature is present at L5. Slight retrolisthesis is present at L2-3 and L3-4. There is slight anterolisthesis at L4-5. Marked facet hypertrophy is present from L3-4 through L5-S1.  Atherosclerotic calcifications are present in the aorta.  There is no significant change in listhesis associated with flexion or extension.  IMPRESSION: 1. 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 2. Retrolisthesis at L3-4 and anterolisthesis at L4-5 is new. 3. Significant facet hypertrophy at L3-4 and L4-5 in particular may contribute to radicular symptoms.  Electronically Signed   By: San Morelle M.D.   On: 04/01/2019 06:02 DG Sacroiliac Joint X-Rays CLINICAL DATA:  Sacroiliac pain.  Chronic low back pain.  EXAM: BILATERAL SACROILIAC JOINTS - 3+ VIEW  COMPARISON:  One-view pelvis 11/22/2011  FINDINGS: The sacroiliac joint spaces are maintained and there is no evidence of arthropathy. No other bone abnormalities are seen.  IMPRESSION: Negative radiographs of the sacroiliac joints.  Electronically Signed   By: San Morelle M.D.   On: 04/01/2019 05:53  Assessment   The primary encounter diagnosis was Myofascial pain syndrome. Diagnoses of Lumbar facet arthropathy, Lumbar spondylosis, Chronic radicular lumbar pain, Lumbar degenerative disc disease, Bilateral primary osteoarthritis of knee, Cervical facet joint syndrome, and Chronic pain syndrome were also pertinent to this visit.   General Recommendations: The pain condition that the patient suffers from is best treated with a multidisciplinary approach that involves an increase in physical activity to prevent de-conditioning and worsening of the pain cycle, as well as psychological counseling (formal and/or informal) to address the co-morbid psychological affects of pain. Treatment will often involve judicious use of  pain medications and interventional procedures to decrease the pain, allowing the patient to participate in the physical activity that will ultimately produce long-lasting pain reductions. The goal of the multidisciplinary approach is to return the patient to a higher level of overall function and to restore their ability to perform activities of daily living.  Plan of Care  1.  Mid thoracic myofascial pain: Trigger point injection 2.  Chronic pain syndrome related to lumbar facet arthropathy, lumbar spondylosis, cervical facet joint syndrome, bilateral knee osteoarthritis with acute pain flare due to mid thoracic myofascial pain, increase tramadol to 100 mg twice daily as needed.  Patient's prior dose was 50 mg twice daily as needed.  She states that while this dose was effective as it is insufficient in managing her pain. 3.  Increase Robaxin 2000 mg nightly for muscle spasms 4.  Continue Tylenol 500 to 1000 mg up to 2 g daily as needed  Future considerations: -Lumbar facet medial branch nerve blocks at L3-L4 and L4-L5 for lumbar facet pathology and lumbar spondylosis.  Could also consider lumbar facet radiofrequency ablation based upon diagnostic lumbar facet medial branch nerve blocks. -Consider  intra-articular knee steroid, intra-articular knee Hyalgan, genicular nerve block.  Bilateral knee osteoarthritis, left > right knee pain  Pharmacotherapy (Medications Ordered): Meds ordered this encounter  Medications  . traMADol (ULTRAM) 50 MG tablet    Sig: Take 2 tablets (100 mg total) by mouth 2 (two) times daily as needed for severe pain.    Dispense:  120 tablet    Refill:  1  . methocarbamol (ROBAXIN) 500 MG tablet    Sig: Take 2 tablets (1,000 mg total) by mouth at bedtime as needed for muscle spasms.    Dispense:  30 tablet    Refill:  0   Orders:  Orders Placed This Encounter  Procedures  . TRIGGER POINT INJECTION    Standing Status:   Future    Standing Expiration Date:   06/11/2019    Scheduling Instructions:     Mid thoracic    Order Specific Question:   Where will this procedure be performed?    Answer:   [facility]   Follow-up plan:   Return in about 1 week (around 05/19/2019) for TPI (sign pain contract).     Has tried cervical and thoracic epidural steroid injections with physiatry at [facility].  Plan for thoracic TPI.  Consider lumbar facet medial branch nerve blocks for severe facet arthropathy lower lumbar spine present on x-ray.  Could also consider knee intra-articular steroid, Hyalgan, genicular nerve block    Recent Visits Date Type Provider Dept  03/31/19 Office Visit Gillis Santa, MD [facility]  Showing recent visits within past 90 days and meeting all other requirements   Today's Visits Date Type Provider Dept  05/12/19 Office Visit Gillis Santa, MD [facility]  Showing today's visits and meeting all other requirements   Future Appointments No visits were found meeting these conditions.  Showing future appointments within next 90 days and meeting all other requirements   I discussed the assessment and treatment plan with the patient. The patient was provided an opportunity to ask questions and all were answered. The patient  agreed with the plan and demonstrated an understanding of the instructions.  Patient advised to call back or seek an in-person evaluation if the symptoms or condition worsens.  Duration of encounter: 30 minutes.  Note by: Gillis Santa, MD Date: 05/12/2019; Time: 10:24 AM

## 2019-07-01 ENCOUNTER — Ambulatory Visit (INDEPENDENT_AMBULATORY_CARE_PROVIDER_SITE_OTHER): Admitting: Psychiatry

## 2019-07-01 ENCOUNTER — Encounter: Payer: Self-pay | Admitting: Psychiatry

## 2019-07-01 ENCOUNTER — Other Ambulatory Visit: Payer: Self-pay

## 2019-07-01 DIAGNOSIS — F17200 Nicotine dependence, unspecified, uncomplicated: Secondary | ICD-10-CM

## 2019-07-01 DIAGNOSIS — G894 Chronic pain syndrome: Secondary | ICD-10-CM

## 2019-07-01 DIAGNOSIS — Z008 Encounter for other general examination: Secondary | ICD-10-CM | POA: Diagnosis not present

## 2019-07-01 DIAGNOSIS — G4701 Insomnia due to medical condition: Secondary | ICD-10-CM | POA: Diagnosis not present

## 2019-07-01 DIAGNOSIS — F411 Generalized anxiety disorder: Secondary | ICD-10-CM

## 2019-07-01 NOTE — Progress Notes (Signed)
Provider Location : [facility] Patient Location : Home  Virtual Visit via Video Note  I connected with Leslie Carrillo on 07/01/19 at  9:00 AM EDT by a video enabled telemedicine application and verified that I am speaking with the correct person using two identifiers.   I discussed the limitations of evaluation and management by telemedicine and the availability of in person appointments. The patient expressed understanding and agreed to proceed.    I discussed the assessment and treatment plan with the patient. The patient was provided an opportunity to ask questions and all were answered. The patient agreed with the plan and demonstrated an understanding of the instructions.   The patient was advised to call back or seek an in-person evaluation if the symptoms worsen or if the condition fails to improve as anticipated.    Psychiatric Initial Adult Assessment   Patient Identification: Leslie Carrillo MRN:  [id number] Date of Evaluation:  07/01/2019 Referral Source: Dr.Bilal Lateef Chief Complaint:   Chief Complaint    Psychiatric Evaluation     Visit Diagnosis:    ICD-10-CM   1. Evaluation by psychiatric service required  Z00.8   2. Chronic pain syndrome  G89.4   3. GAD (generalized anxiety disorder)  F41.1   4. Insomnia due to medical condition  G47.01   5. Tobacco use disorder  F17.200     History of Present Illness: [name] is 59 year old Caucasian female, married, on disability, lives in [place], has a history of chronic pain, anxiety disorder currently stable on medications, degenerative disc disease, restless leg syndrome, cervical radicular pain, cervical facet joint syndrome, bilateral primary osteoarthritis of knee, lumbar degenerative disc disease, sacroiliac joint pain, was evaluated by telemedicine today.  Patient was referred to the clinic for routine assessment of possible mental health/substance abuse risk potential prior to initiation of pain management by her  pain provider.  Patient today reports that she has been struggling with back pain, neck pain as well as arthralgias since the past several years.  She reports she had a work-related accident in 2013.  She reports she did try physical therapy during that time which did not work.  She reports she underwent surgery-spinal fusion C5-C6 and C6-C7 in 2014.  She reports she tried physical therapy after the surgery.  However her pain gradually started getting worse over the years.  She reports she hence was referred to pain management.  She reports her pain gets worse as the day progresses.  This has been going on since the past several months.  She rates her pain anywhere between 5-8 out of 10, 10 being the worst.  She reports at the end of the day her pain is always 8 out of 10 and is unbearable.  Patient reports she was prescribed tramadol by her providers in the past.  Dr. Holley Raring increased her tramadol recently.  She however reports when she increased her dosage of tramadol she noticed rashes and itching all over her skin.  She hence went back to her previous dosage and the rashes are currently fading away.  Patient reports she is planning to talk to her pain management about the same.  Patient reports she struggles with sleep issues more so because of her pain.  It is always difficult to find the right position to sleep especially with her neck.  This does affect her sleep.  This has been getting worse since the past several months.  Patient does report a history of anxiety disorder.  She  however reports she has been on the Zoloft since the past several years.  The Zoloft currently is keeping her anxiety symptoms stable.  Patient does report mild depressive symptoms of feeling down on and off.  However she is able to cope with it .  She reports her psychosocial stressors of her own health issues as well as the current pandemic may be causing this.  Patient denies any suicidality, homicidality or perceptual  disturbances.  Patient reports good support system from her family.  Patient denies any history of mood lability, manic symptoms or obsessions or compulsions.  Patient denies any history of substance abuse problems.  Patient does report a history of trauma, work-related injury.  Patient however denies any PTSD symptoms from the same.    Associated Signs/Symptoms: Depression Symptoms:  denies now - stable on medications (Hypo) Manic Symptoms:  denies Anxiety Symptoms:  denies now, stable on medications Psychotic Symptoms:  denies PTSD Symptoms: Had a traumatic exposure:  as noted above  Past Psychiatric History: Patient with history of generalized anxiety disorder, has been under the care of her primary care provider since the past several years.  Her anxiety symptoms are currently stable.  Patient has tried multiple psychotropic medications in the past.  Patient denies suicidality.  Patient denies suicide attempts.  Patient denies inpatient mental health admissions.  Previous Psychotropic Medications: Yes Zoloft, Paxil, Prozac and multiple other medications-does not remember all the names.  Substance Abuse History in the last 12 months:  No.  Consequences of Substance Abuse: Negative  Past Medical History:  Past Medical History:  Diagnosis Date  . Anemia   . Arthritis   . Asthma   . Chronic kidney disease   . Chronic pain     Past Surgical History:  Procedure Laterality Date  . CERVICAL FUSION  2014    Family Psychiatric History: As noted below  Family History:  Family History  Problem Relation Age of Onset  . Dementia Mother   . Mental illness Neg Hx     Social History:   Social History   Socioeconomic History  . Marital status: Married    Spouse name: Not on file  . Number of children: 2  . Years of education: Not on file  . Highest education level: 12th grade  Occupational History  . Occupation: disabled  Tobacco Use  . Smoking status: Current Every  Day Smoker    Packs/day: 0.50    Years: 20.00    Pack years: 10.00  . Smokeless tobacco: Never Used  Substance and Sexual Activity  . Alcohol use: Never  . Drug use: Never  . Sexual activity: Not Currently  Other Topics Concern  . Not on file  Social History Narrative  . Not on file   Social Determinants of Health   Financial Resource Strain:   . Difficulty of Paying Living Expenses:   Food Insecurity:   . Worried About Programme researcher, broadcasting/film/video in the Last Year:   . Barista in the Last Year:   Transportation Needs:   . Freight forwarder (Medical):   Marland Kitchen Lack of Transportation (Non-Medical):   Physical Activity:   . Days of Exercise per Week:   . Minutes of Exercise per Session:   Stress:   . Feeling of Stress :   Social Connections:   . Frequency of Communication with Friends and Family:   . Frequency of Social Gatherings with Friends and Family:   . Attends Religious Services:   .  Active Member of Clubs or Organizations:   . Attends Banker Meetings:   Marland Kitchen Marital Status:     Additional Social History: Patient reports she was raised by both parents.  She graduated high school.  She currently lives in [place] with her husband.  She has a son and a daughter-both adults.  She has 4 grandchildren.  Patient has worked several jobs.  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  Patient currently is on disability.  Patient denies any history of legal issues.  Patient denies any history of being in the [employer].  Patient has good social support system.  Allergies:   Allergies  Allergen Reactions  . Other Nausea Only, Nausea And Vomiting and Anaphylaxis  . Tramadol Rash    At high dosage   . Codeine Nausea And Vomiting  . Lovastatin Other (See Comments)    "i just feel ill"  . Simvastatin Other (See Comments)    "i just feel ill"    Metabolic Disorder Labs: No results found for: HGBA1C, MPG No results found for:  PROLACTIN No results found for: CHOL, TRIG, HDL, CHOLHDL, VLDL, LDLCALC No results found for: TSH  Therapeutic Level Labs: No results found for: LITHIUM No results found for: CBMZ No results found for: VALPROATE  Current Medications: Current Outpatient Medications  Medication Sig Dispense Refill  . Acetaminophen (TYLENOL PO) Take 500 mg by mouth as needed.    . methocarbamol (ROBAXIN) 500 MG tablet Take 2 tablets (1,000 mg total) by mouth at bedtime as needed for muscle spasms. 30 tablet 0  . sertraline (ZOLOFT) 100 MG tablet Take 100 mg by mouth daily.    . traMADol (ULTRAM) 50 MG tablet Take 2 tablets (100 mg total) by mouth 2 (two) times daily as needed for severe pain. 120 tablet 1   No current facility-administered medications for this visit.    Musculoskeletal: Strength & Muscle Tone: UTA Gait & Station: Observed as seated Patient leans: N/A  Psychiatric Specialty Exam: Review of Systems  Musculoskeletal: Positive for arthralgias, back pain, myalgias and neck pain.  Psychiatric/Behavioral: Positive for sleep disturbance (due to pain).  All other systems reviewed and are negative.   There were no vitals taken for this visit.There is no height or weight on file to calculate BMI.  General Appearance: Casual  Eye Contact:  Fair  Speech:  Clear and Coherent  Volume:  Normal  Mood:  Euthymic  Affect:  Congruent  Thought Process:  Goal Directed and Descriptions of Associations: Intact  Orientation:  Full (Time, Place, and Person)  Thought Content:  Logical  Suicidal Thoughts:  No  Homicidal Thoughts:  No  Memory:  Immediate;   Fair Recent;   Fair Remote;   Fair  Judgement:  Fair  Insight:  Fair  Psychomotor Activity:  Normal  Concentration:  Concentration: Fair and Attention Span: Fair  Recall:  [employer] of Knowledge:Fair  Language: Fair  Akathisia:  No  Handed:  Right  AIMS (if indicated): UTA  Assets:  Communication Skills Desire for  Improvement Housing Social Support  ADL's:  Intact  Cognition: WNL  Sleep:  restless due to pain   Screenings: PHQ2-9     Office Visit from 03/31/2019 in [facility]  PHQ-2 Total Score  0      Assessment and Plan: [name] is a 59 year old Caucasian female who has a history of chronic low back pain,  degenerative disc disease, restless leg syndrome, cervical radicular pain, cervical facet joint syndrome, bilateral primary osteoarthritis of knee, sacroiliac joint pain, generalized anxiety disorder, was evaluated by telemedicine today.  Patient was referred to the clinic for routine assessment of possible mental health/substance abuse risk potential by her pain provider prior to initiation of pain management.  The following instruments were used Clinical interview Screener and opioid assessment for patients with pain/revised Opioid risk tool Drug abuse screening test Alcohol use disorder identification test GAD 7 PHQ 9  I have reviewed urine compliance drug screen dated 03/31/2019. I have reviewed [place] controlled substance database.  Based on clinical interview and instruments used at the time of evaluation the risk is determined to be low.  Patient does have a history of generalized anxiety disorder and also currently has psychosocial stressors of her own health issues as well as the pandemic however her mood symptoms are currently stable on Zoloft.  Patient advised to continue to monitor her symptoms closely and discuss further management with her primary care provider as needed. Patient does have sleep issues related to her pain.  Patient advised to monitor her sleep and once she has better control of her pain and if she continues to struggle with sleep at that time may need prescription sleep medications.  She agrees to discuss this with her primary care provider.  Patient with tobacco use disorder-currently not willing to quit.  Will  continue to need counseling.  I have spent atleast 60 minutes non face to face with patient today. More than 50 % of the time was spent for preparing to see the patient ( e.g., review of test, records ), obtaining and to review and separately obtained history , psychoeducation and supportive psychotherapy and care coordination,as well as documenting clinical information in electronic health record,interpreting results of test and communication of results. This note was generated in part or whole with voice recognition software. Voice recognition is usually quite accurate but there are transcription errors that can and very often do occur. I apologize for any typographical errors that were not detected and corrected.         Jomarie Longs, MD 4/14/202110:47 AM

## 2019-07-08 ENCOUNTER — Telehealth: Payer: Self-pay

## 2019-07-08 NOTE — Telephone Encounter (Signed)
Attempted to call patient. No answer, Left message on AM to call us for information for 07/09/19 VV

## 2019-07-09 ENCOUNTER — Encounter: Payer: Self-pay | Admitting: Student in an Organized Health Care Education/Training Program

## 2019-07-09 ENCOUNTER — Other Ambulatory Visit: Payer: Self-pay

## 2019-07-09 ENCOUNTER — Ambulatory Visit
Attending: Student in an Organized Health Care Education/Training Program | Admitting: Student in an Organized Health Care Education/Training Program

## 2019-07-09 DIAGNOSIS — G8929 Other chronic pain: Secondary | ICD-10-CM

## 2019-07-09 DIAGNOSIS — M7918 Myalgia, other site: Secondary | ICD-10-CM

## 2019-07-09 DIAGNOSIS — M47812 Spondylosis without myelopathy or radiculopathy, cervical region: Secondary | ICD-10-CM

## 2019-07-09 DIAGNOSIS — M47816 Spondylosis without myelopathy or radiculopathy, lumbar region: Secondary | ICD-10-CM

## 2019-07-09 DIAGNOSIS — M533 Sacrococcygeal disorders, not elsewhere classified: Secondary | ICD-10-CM

## 2019-07-09 DIAGNOSIS — M5412 Radiculopathy, cervical region: Secondary | ICD-10-CM

## 2019-07-09 DIAGNOSIS — M5416 Radiculopathy, lumbar region: Secondary | ICD-10-CM

## 2019-07-09 DIAGNOSIS — G894 Chronic pain syndrome: Secondary | ICD-10-CM

## 2019-07-09 MED ORDER — BUPRENORPHINE 7.5 MCG/HR TD PTWK: 1.0000 | MEDICATED_PATCH | TRANSDERMAL | 0 refills | Status: DC

## 2019-07-09 MED ORDER — HYDROCODONE-ACETAMINOPHEN 5-325 MG PO TABS: 1.0000 | ORAL_TABLET | Freq: Two times a day (BID) | ORAL | 0 refills | Status: AC | PRN

## 2019-07-09 NOTE — Progress Notes (Signed)
Patient: Leslie Carrillo  Service Category: E/M  Provider: Gillis Santa, MD  DOB: 09-Sep-1960  DOS: 07/09/2019  Location: Office  MRN: [id number]  Setting: Ambulatory outpatient  Referring Provider: Baxter Hire, MD  Type: Established Patient  Specialty: Interventional Pain Management  PCP: Baxter Hire, MD  Location: Home  Delivery: TeleHealth     Virtual Encounter - Pain Management PROVIDER NOTE: Information contained herein reflects review and annotations entered in association with encounter. Interpretation of such information and data should be left to medically-trained personnel. Information provided to patient can be located elsewhere in the medical record under "Patient Instructions". Document created using STT-dictation technology, any transcriptional errors that may result from process are unintentional.    Contact & Pharmacy Preferred: [phone] Home: [phone] (home) Mobile: [phone] (mobile) E-mail: [email or web address]  [id number], [place] [address] [place] [address] Phone: [phone] Fax: [phone]   Pre-screening  Leslie Carrillo offered "in-person" vs "virtual" encounter. She indicated preferring virtual for this encounter.   Reason COVID-19*  Social distancing based on CDC and AMA recommendations.   I contacted Leslie Carrillo on 07/09/2019 via telephone.      I clearly identified myself as Gillis Santa, MD. I verified that I was speaking with the correct person using two identifiers (Name: Leslie Carrillo, and date of birth: 07/23/1960).  This visit was completed via telephone due to the restrictions of the COVID-19 pandemic. All issues as above were discussed and addressed but no physical exam was performed. If it was felt that the patient should be evaluated in the office, they were directed there. The patient verbally consented to this visit. Patient was unable to  complete an audio/visual visit due to Technical difficulties and/or Lack of internet. Due to the catastrophic nature of the COVID-19 pandemic, this visit was done through audio contact only.  Location of the patient: home address (see Epic for details)  Location of the provider: office  Consent I sought verbal advanced consent from Leslie Carrillo for virtual visit interactions. I informed Leslie Carrillo of possible security and privacy concerns, risks, and limitations associated with providing "not-in-person" medical evaluation and management services. I also informed Leslie Carrillo of the availability of "in-person" appointments. Finally, I informed her that there would be a charge for the virtual visit and that she could be  personally, fully or partially, financially responsible for it. Leslie Carrillo expressed understanding and agreed to proceed.   Historic Elements   Leslie Carrillo is a 59 y.o. year old, female patient evaluated today after her last contact with our practice on 07/08/2019. Leslie Carrillo  has a past medical history of Anemia, Arthritis, Asthma, Chronic kidney disease, and Chronic pain. She also  has a past surgical history that includes Cervical fusion (2014). Leslie Carrillo has a current medication list which includes the following prescription(s): acetaminophen, buprenorphine, hydrocodone-acetaminophen, methocarbamol, sertraline, and tramadol. She  reports that she has been smoking. She has a 10.00 pack-year smoking history. She has never used smokeless tobacco. She reports that she does not drink alcohol or use drugs. Leslie Carrillo is allergic to other; tramadol; codeine; lovastatin; and simvastatin.   HPI  Today, she is being contacted for medication management.   Broke out in rashes at increased Tramdol dose of 100 mg BID. Has reduced back to 50 mg BID. Patient has seen Dr Shea Evans with psychiatry for risk assessment.  Is deemed low risk.  Appreciate the consult. States that she needs to try  something different than tramadol since it is not very effective.  We discussed escalation to hydrocodone 5 mg twice daily as needed for breakthrough pain and and the addition of buprenorphine to help manage her baseline pain which is a long-acting opioid analgesic.  Patient would like to trial.  Pharmacotherapy Assessment  Analgesic: We will transition from tramadol to hydrocodone 5 mg twice daily as needed and add buprenorphine for long-acting analgesic  Monitoring: [place] PMP: PDMP reviewed during this encounter.       Pharmacotherapy: No side-effects or adverse reactions reported. Compliance: No problems identified. Effectiveness: Clinically acceptable. Plan: Refer to "POC".  UDS:  Summary  Date Value Ref Range Status  03/31/2019 Note  Final    Comment:    ==================================================================== Compliance Drug Analysis, Ur ==================================================================== Test                             Result       Flag       Units Drug Present and Declared for Prescription Verification   Tramadol                       >7042        EXPECTED   ng/mg creat   O-Desmethyltramadol            >7042        EXPECTED   ng/mg creat   N-Desmethyltramadol            2638         EXPECTED   ng/mg creat    Source of tramadol is a prescription medication. O-desmethyltramadol    and N-desmethyltramadol are expected metabolites of tramadol.   Sertraline                     PRESENT      EXPECTED   Desmethylsertraline            PRESENT      EXPECTED    Desmethylsertraline is an expected metabolite of sertraline.   Acetaminophen                  PRESENT      EXPECTED Drug Absent but Declared for Prescription Verification   Methocarbamol                  Not Detected UNEXPECTED ==================================================================== Test                      Result    Flag   Units      Ref Range   Creatinine              71                mg/dL      >=20 ==================================================================== Declared Medications:  The flagging and interpretation on this report are based on the  following declared medications.  Unexpected results may arise from  inaccuracies in the declared medications.  **Note: The testing scope of this panel includes these medications:  Methocarbamol  Sertraline  Tramadol  **Note: The testing scope of this panel does not include small to  moderate amounts of these reported medications:  Acetaminophen ==================================================================== For clinical consultation, please call [phone]. ====================================================================    Laboratory Chemistry Profile   Renal No results found  for: BUN, CREATININE, LABCREA, BCR, GFR, GFRAA, GFRNONAA, LABVMA, EPIRU, EPINEPH24HUR, NOREPRU, NOREPI24HUR, DOPARU, [id number]   Hepatic No results found for: AST, ALT, ALBUMIN, ALKPHOS, HCVAB, AMYLASE, LIPASE, AMMONIA   Electrolytes No results found for: NA, K, CL, CALCIUM, MG, PHOS   Bone No results found for: VD25OH, [id number], FA2130QM5, HQ4696EX5, 25OHVITD1, 25OHVITD2, 25OHVITD3, TESTOFREE, TESTOSTERONE   Inflammation (CRP: Acute Phase) (ESR: Chronic Phase) No results found for: CRP, ESRSEDRATE, LATICACIDVEN     Note: Above Lab results reviewed.  Imaging  DG Hip (Right) CLINICAL DATA:  Low back pain extending into the hips bilaterally.  EXAM: RIGHT KNEE - 1-2 VIEW; LEFT KNEE - 1-2 VIEW; DG HIP (WITH OR WITHOUT PELVIS) 2-3V LEFT; LUMBAR SPINE - COMPLETE WITH BENDING VIEWS; DG HIP (WITH OR WITHOUT PELVIS) 2-3V RIGHT  COMPARISON:  MRI lumbar spine 01/09/2012  FINDINGS: Non rib-bearing lumbar type vertebral bodies are present. Progressive levoconvex curvature of the lumbar spine is centered at L2-3. Compensatory rightward curvature is present at L5. Slight retrolisthesis is present at L2-3 and L3-4. There  is slight anterolisthesis at L4-5. Marked facet hypertrophy is present from L3-4 through L5-S1.  Atherosclerotic calcifications are present in the aorta.  There is no significant change in listhesis associated with flexion or extension.  IMPRESSION: 1. 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 2. Retrolisthesis at L3-4 and anterolisthesis at L4-5 is new. 3. Significant facet hypertrophy at L3-4 and L4-5 in particular may contribute to radicular symptoms.  Electronically Signed   By: San Morelle M.D.   On: 04/01/2019 06:02 DG Hip (Left) CLINICAL DATA:  Low back pain extending into the hips bilaterally.  EXAM: RIGHT KNEE - 1-2 VIEW; LEFT KNEE - 1-2 VIEW; DG HIP (WITH OR WITHOUT PELVIS) 2-3V LEFT; LUMBAR SPINE - COMPLETE WITH BENDING VIEWS; DG HIP (WITH OR WITHOUT PELVIS) 2-3V RIGHT  COMPARISON:  MRI lumbar spine 01/09/2012  FINDINGS: Non rib-bearing lumbar type vertebral bodies are present. Progressive levoconvex curvature of the lumbar spine is centered at L2-3. Compensatory rightward curvature is present at L5. Slight retrolisthesis is present at L2-3 and L3-4. There is slight anterolisthesis at L4-5. Marked facet hypertrophy is present from L3-4 through L5-S1.  Atherosclerotic calcifications are present in the aorta.  There is no significant change in listhesis associated with flexion or extension.  IMPRESSION: 1. 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 2. Retrolisthesis at L3-4 and anterolisthesis at L4-5 is new. 3. Significant facet hypertrophy at L3-4 and L4-5 in particular may contribute to radicular symptoms.  Electronically Signed   By: San Morelle M.D.   On: 04/01/2019 06:02 DG Knee (Left) CLINICAL DATA:  Low back pain extending into the hips bilaterally.  EXAM: RIGHT KNEE - 1-2 VIEW; LEFT KNEE - 1-2 VIEW; DG HIP (WITH OR WITHOUT PELVIS) 2-3V LEFT; LUMBAR SPINE - COMPLETE WITH BENDING VIEWS; DG HIP (WITH OR  WITHOUT PELVIS) 2-3V RIGHT  COMPARISON:  MRI lumbar spine 01/09/2012  FINDINGS: Non rib-bearing lumbar type vertebral bodies are present. Progressive levoconvex curvature of the lumbar spine is centered at L2-3. Compensatory rightward curvature is present at L5. Slight retrolisthesis is present at L2-3 and L3-4. There is slight anterolisthesis at L4-5. Marked facet hypertrophy is present from L3-4 through L5-S1.  Atherosclerotic calcifications are present in the aorta.  There is no significant change in listhesis associated with flexion or extension.  IMPRESSION: 1. 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 2. Retrolisthesis at L3-4 and anterolisthesis at L4-5 is new. 3. Significant facet hypertrophy at L3-4 and  L4-5 in particular may contribute to radicular symptoms.  Electronically Signed   By: San Morelle M.D.   On: 04/01/2019 06:02 DG Lumbar Spine Complete w/ Flex/Ext (6 Views) CLINICAL DATA:  Low back pain extending into the hips bilaterally.  EXAM: RIGHT KNEE - 1-2 VIEW; LEFT KNEE - 1-2 VIEW; DG HIP (WITH OR WITHOUT PELVIS) 2-3V LEFT; LUMBAR SPINE - COMPLETE WITH BENDING VIEWS; DG HIP (WITH OR WITHOUT PELVIS) 2-3V RIGHT  COMPARISON:  MRI lumbar spine 01/09/2012  FINDINGS: Non rib-bearing lumbar type vertebral bodies are present. Progressive levoconvex curvature of the lumbar spine is centered at L2-3. Compensatory rightward curvature is present at L5. Slight retrolisthesis is present at L2-3 and L3-4. There is slight anterolisthesis at L4-5. Marked facet hypertrophy is present from L3-4 through L5-S1.  Atherosclerotic calcifications are present in the aorta.  There is no significant change in listhesis associated with flexion or extension.  IMPRESSION: 1. 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 2. Retrolisthesis at L3-4 and anterolisthesis at L4-5 is new. 3. Significant facet hypertrophy at L3-4 and L4-5 in particular  may contribute to radicular symptoms.  Electronically Signed   By: San Morelle M.D.   On: 04/01/2019 06:02 DG Knee (Right) CLINICAL DATA:  Low back pain extending into the hips bilaterally.  EXAM: RIGHT KNEE - 1-2 VIEW; LEFT KNEE - 1-2 VIEW; DG HIP (WITH OR WITHOUT PELVIS) 2-3V LEFT; LUMBAR SPINE - COMPLETE WITH BENDING VIEWS; DG HIP (WITH OR WITHOUT PELVIS) 2-3V RIGHT  COMPARISON:  MRI lumbar spine 01/09/2012  FINDINGS: Non rib-bearing lumbar type vertebral bodies are present. Progressive levoconvex curvature of the lumbar spine is centered at L2-3. Compensatory rightward curvature is present at L5. Slight retrolisthesis is present at L2-3 and L3-4. There is slight anterolisthesis at L4-5. Marked facet hypertrophy is present from L3-4 through L5-S1.  Atherosclerotic calcifications are present in the aorta.  There is no significant change in listhesis associated with flexion or extension.  IMPRESSION: 1. 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 2. Retrolisthesis at L3-4 and anterolisthesis at L4-5 is new. 3. Significant facet hypertrophy at L3-4 and L4-5 in particular may contribute to radicular symptoms.  Electronically Signed   By: San Morelle M.D.   On: 04/01/2019 06:02 DG Sacroiliac Joint X-Rays CLINICAL DATA:  Sacroiliac pain.  Chronic low back pain.  EXAM: BILATERAL SACROILIAC JOINTS - 3+ VIEW  COMPARISON:  One-view pelvis 11/22/2011  FINDINGS: The sacroiliac joint spaces are maintained and there is no evidence of arthropathy. No other bone abnormalities are seen.  IMPRESSION: Negative radiographs of the sacroiliac joints.  Electronically Signed   By: San Morelle M.D.   On: 04/01/2019 05:53  Assessment  The primary encounter diagnosis was Lumbar facet arthropathy. Diagnoses of Lumbar spondylosis, Chronic radicular lumbar pain, Cervical facet joint syndrome, Myofascial pain syndrome, Sacroiliac joint pain, Cervical  radicular pain, and Chronic pain syndrome were also pertinent to this visit.  Plan of Care  Leslie Carrillo has a current medication list which includes the following long-term medication(s): sertraline. Discontinue tramadol due to side effects.  Has tried and failed gabapentin and Lyrica.  Avoid Cymbalta as patient is on Zoloft and risk of serotonin syndrome.  Has tried [facility] which was not effective.  Status post cervical and thoracic epidural steroid injections which 2 were not effective.  Medication management as below.  Transition to hydrocodone for breakthrough pain.  Addition of buprenorphine patch to help with baseline pain as this is a long-acting partial opioid  agonist.  Pharmacotherapy (Medications Ordered): Meds ordered this encounter  Medications  . Buprenorphine 7.5 MCG/HR PTWK    Sig: Place 1 patch onto the skin every 7 (seven) days.    Dispense:  4 patch    Refill:  0    Do not place this medication, or any other prescription from our practice, on "Automatic Refill". For chronic pain syndrome  . HYDROcodone-acetaminophen (NORCO/VICODIN) 5-325 MG tablet    Sig: Take 1 tablet by mouth every 12 (twelve) hours as needed for severe pain. Must last 30 days.    Dispense:  60 tablet    Refill:  0    Chronic Pain. (STOP Act - Not applicable). Fill one day early if closed on scheduled refill date.   Follow-up plan:   Return in about 4 weeks (around 08/06/2019) for Medication Management.     Has tried cervical and thoracic epidural steroid injections with physiatry at [facility].  Plan for thoracic TPI.  Consider lumbar facet medial branch nerve blocks for severe facet arthropathy lower lumbar spine present on x-ray.  Could also consider knee intra-articular steroid, Hyalgan, genicular nerve block     Recent Visits Date Type Provider Dept  05/12/19 Office Visit Gillis Santa, MD [facility]  Showing recent visits within past 90 days and meeting all other requirements    Today's Visits Date Type Provider Dept  07/09/19 Telemedicine Gillis Santa, MD [facility]  Showing today's visits and meeting all other requirements   Future Appointments No visits were found meeting these conditions.  Showing future appointments within next 90 days and meeting all other requirements   I discussed the assessment and treatment plan with the patient. The patient was provided an opportunity to ask questions and all were answered. The patient agreed with the plan and demonstrated an understanding of the instructions.  Patient advised to call back or seek an in-person evaluation if the symptoms or condition worsens.  Duration of encounter: 25 minutes.  Note by: Gillis Santa, MD Date: 07/09/2019; Time: 10:48 AM

## 2019-08-06 ENCOUNTER — Telehealth: Admitting: Student in an Organized Health Care Education/Training Program

## 2019-08-25 ENCOUNTER — Other Ambulatory Visit: Payer: Self-pay

## 2019-08-25 ENCOUNTER — Encounter: Payer: Self-pay | Admitting: Student in an Organized Health Care Education/Training Program

## 2019-08-25 ENCOUNTER — Ambulatory Visit
Attending: Student in an Organized Health Care Education/Training Program | Admitting: Student in an Organized Health Care Education/Training Program

## 2019-08-25 VITALS — BP 125/84 | HR 67 | Temp 97.8°F | Ht 63.0 in | Wt 180.0 lb

## 2019-08-25 DIAGNOSIS — G894 Chronic pain syndrome: Secondary | ICD-10-CM | POA: Insufficient documentation

## 2019-08-25 DIAGNOSIS — M5412 Radiculopathy, cervical region: Secondary | ICD-10-CM

## 2019-08-25 DIAGNOSIS — M7918 Myalgia, other site: Secondary | ICD-10-CM | POA: Diagnosis present

## 2019-08-25 DIAGNOSIS — M47816 Spondylosis without myelopathy or radiculopathy, lumbar region: Secondary | ICD-10-CM | POA: Insufficient documentation

## 2019-08-25 DIAGNOSIS — M25562 Pain in left knee: Secondary | ICD-10-CM | POA: Insufficient documentation

## 2019-08-25 DIAGNOSIS — G8929 Other chronic pain: Secondary | ICD-10-CM | POA: Insufficient documentation

## 2019-08-25 DIAGNOSIS — M5416 Radiculopathy, lumbar region: Secondary | ICD-10-CM

## 2019-08-25 DIAGNOSIS — M47812 Spondylosis without myelopathy or radiculopathy, cervical region: Secondary | ICD-10-CM | POA: Diagnosis not present

## 2019-08-25 MED ORDER — BUPRENORPHINE 7.5 MCG/HR TD PTWK: 1.0000 | MEDICATED_PATCH | TRANSDERMAL | 1 refills | Status: DC

## 2019-08-25 MED ORDER — BUPRENORPHINE 7.5 MCG/HR TD PTWK
1.0000 | MEDICATED_PATCH | TRANSDERMAL | 1 refills | Status: DC
Start: 2019-08-25 — End: 2019-08-25

## 2019-08-25 NOTE — Progress Notes (Signed)
PROVIDER NOTE: Information contained herein reflects review and annotations entered in association with encounter. Interpretation of such information and data should be left to medically-trained personnel. Information provided to patient can be located elsewhere in the medical record under "Patient Instructions". Document created using STT-dictation technology, any transcriptional errors that may result from process are unintentional.    Patient: Leslie Carrillo  Service Category: E/M  Provider: Gillis Santa, MD  DOB: [birth year]  DOS: 08/25/2019  Specialty: Interventional Pain Management  MRN: [id number]  Setting: Ambulatory outpatient  PCP: Baxter Hire, MD  Type: Established Patient    Referring Provider: Baxter Hire, MD  Location: Office  Delivery: Face-to-face     HPI  Reason for encounter: Leslie Carrillo, a 59 y.o. year old female, is here today for evaluation and management of her Lumbar facet arthropathy [M47.816]. Leslie Carrillo primary complain today is Back Pain Last encounter: Practice (07/08/2019). My last encounter with her was on 05/12/2019. Pertinent problems: Leslie Carrillo has Chronic low back pain; DDD (degenerative disc disease), thoracic; Chronic radicular lumbar pain; Cervical facet joint syndrome; Bilateral primary osteoarthritis of knee; Lumbar degenerative disc disease; Chronic pain syndrome; Myofascial pain syndrome; and Lumbar spondylosis on their pertinent problem list. Pain Assessment: Severity of Chronic pain is reported as a 7 /10. Location: Back Lower/Denies. Onset: More than a month ago. Quality: Hervey Ard, Aching. Timing: Constant. Modifying factor(s): meds, sitting. Vitals:  height is _0  (1.6 m) and weight is 180 lb (81.6 kg). Her temperature is 97.8 F (36.6 C). Her blood pressure is 125/84 and her pulse is 67. Her oxygen saturation is 99%.   Patient presents today for medication management.  She states that the hydrocodone resulted in diffuse hives all across her body.   She states that she has not taken any hydrocodone in the last 3 weeks.  She states that she did not pick up her Lyrica as well.  She has tried tramadol and hydrocodone in the past which were either ineffective or resulted in side effects such as hives.  The patient has chronic pain that is present in many joints.  She has diffuse arthralgias and myalgias.  Discussed buprenorphine for chronic pain management as this is a long-acting opioid analgesic with less side effect potential for pruritus and hives.  Risks and benefits reviewed and patient like to proceed.  ROS  Constitutional: Denies any fever or chills Gastrointestinal: No reported hemesis, hematochezia, vomiting, or acute GI distress Musculoskeletal: Denies any acute onset joint swelling, redness, loss of ROM, or weakness Neurological: No reported episodes of acute onset apraxia, aphasia, dysarthria, agnosia, amnesia, paralysis, loss of coordination, or loss of consciousness  Medication Review  Acetaminophen, buprenorphine, methocarbamol, and sertraline  History Review  Allergy: Leslie Carrillo is allergic to lyrica [pregabalin]; other; hydrocodone-acetaminophen; tramadol; codeine; lovastatin; and simvastatin. Drug: Leslie Carrillo  reports no history of drug use. Alcohol:  reports no history of alcohol use. Tobacco:  reports that she has been smoking. She has a 10.00 pack-year smoking history. She has never used smokeless tobacco. Social: Leslie Carrillo  reports that she has been smoking. She has a 10.00 pack-year smoking history. She has never used smokeless tobacco. She reports that she does not drink alcohol or use drugs. Medical:  has a past medical history of Anemia, Arthritis, Asthma, Chronic kidney disease, and Chronic pain. Surgical: Leslie Carrillo  has a past surgical history that includes Cervical fusion (2014). Family: family history includes Dementia in her mother.  Laboratory Chemistry Profile  Renal No results found for: BUN, CREATININE,  LABCREA, BCR, GFR, GFRAA, GFRNONAA, LABVMA, EPIRU, EPINEPH24HUR, NOREPRU, NOREPI24HUR, DOPARU, [id number]   Hepatic No results found for: AST, ALT, ALBUMIN, ALKPHOS, HCVAB, AMYLASE, LIPASE, AMMONIA   Electrolytes No results found for: NA, K, CL, CALCIUM, MG, PHOS   Bone No results found for: VD25OH, [id number], [id number], [id number], 25OHVITD1, 25OHVITD2, 25OHVITD3, TESTOFREE, TESTOSTERONE   Inflammation (CRP: Acute Phase) (ESR: Chronic Phase) No results found for: CRP, ESRSEDRATE, LATICACIDVEN     Note: Above Lab results reviewed.  Recent Imaging Review  DG Hip (Right) CLINICAL DATA:  Low back pain extending into the hips bilaterally.  EXAM: RIGHT KNEE - 1-2 VIEW; LEFT KNEE - 1-2 VIEW; DG HIP (WITH OR WITHOUT PELVIS) 2-3V LEFT; LUMBAR SPINE - COMPLETE WITH BENDING VIEWS; DG HIP (WITH OR WITHOUT PELVIS) 2-3V RIGHT  COMPARISON:  MRI lumbar spine 01/09/2012  FINDINGS: Non rib-bearing lumbar type vertebral bodies are present. Progressive levoconvex curvature of the lumbar spine is centered at L2-3. Compensatory rightward curvature is present at L5. Slight retrolisthesis is present at L2-3 and L3-4. There is slight anterolisthesis at L4-5. Marked facet hypertrophy is present from L3-4 through L5-S1.  Atherosclerotic calcifications are present in the aorta.  There is no significant change in listhesis associated with flexion or extension.  IMPRESSION: 1. 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 2. Retrolisthesis at L3-4 and anterolisthesis at L4-5 is new. 3. Significant facet hypertrophy at L3-4 and L4-5 in particular may contribute to radicular symptoms.  Electronically Signed   By: San Morelle M.D.   On: 04/01/2019 06:02 DG Hip (Left) CLINICAL DATA:  Low back pain extending into the hips bilaterally.  EXAM: RIGHT KNEE - 1-2 VIEW; LEFT KNEE - 1-2 VIEW; DG HIP (WITH OR WITHOUT PELVIS) 2-3V LEFT; LUMBAR SPINE - COMPLETE WITH BENDING VIEWS; DG HIP  (WITH OR WITHOUT PELVIS) 2-3V RIGHT  COMPARISON:  MRI lumbar spine 01/09/2012  FINDINGS: Non rib-bearing lumbar type vertebral bodies are present. Progressive levoconvex curvature of the lumbar spine is centered at L2-3. Compensatory rightward curvature is present at L5. Slight retrolisthesis is present at L2-3 and L3-4. There is slight anterolisthesis at L4-5. Marked facet hypertrophy is present from L3-4 through L5-S1.  Atherosclerotic calcifications are present in the aorta.  There is no significant change in listhesis associated with flexion or extension.  IMPRESSION: 1. 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 2. Retrolisthesis at L3-4 and anterolisthesis at L4-5 is new. 3. Significant facet hypertrophy at L3-4 and L4-5 in particular may contribute to radicular symptoms.  Electronically Signed   By: San Morelle M.D.   On: 04/01/2019 06:02 DG Knee (Left) CLINICAL DATA:  Low back pain extending into the hips bilaterally.  EXAM: RIGHT KNEE - 1-2 VIEW; LEFT KNEE - 1-2 VIEW; DG HIP (WITH OR WITHOUT PELVIS) 2-3V LEFT; LUMBAR SPINE - COMPLETE WITH BENDING VIEWS; DG HIP (WITH OR WITHOUT PELVIS) 2-3V RIGHT  COMPARISON:  MRI lumbar spine 01/09/2012  FINDINGS: Non rib-bearing lumbar type vertebral bodies are present. Progressive levoconvex curvature of the lumbar spine is centered at L2-3. Compensatory rightward curvature is present at L5. Slight retrolisthesis is present at L2-3 and L3-4. There is slight anterolisthesis at L4-5. Marked facet hypertrophy is present from L3-4 through L5-S1.  Atherosclerotic calcifications are present in the aorta.  There is no significant change in listhesis associated with flexion or extension.  IMPRESSION: 1. 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 2. Retrolisthesis at L3-4 and anterolisthesis at L4-5 is new. 3.  Significant facet hypertrophy at L3-4 and L4-5 in particular may contribute to radicular  symptoms.  Electronically Signed   By: San Morelle M.D.   On: 04/01/2019 06:02 DG Lumbar Spine Complete w/ Flex/Ext (6 Views) CLINICAL DATA:  Low back pain extending into the hips bilaterally.  EXAM: RIGHT KNEE - 1-2 VIEW; LEFT KNEE - 1-2 VIEW; DG HIP (WITH OR WITHOUT PELVIS) 2-3V LEFT; LUMBAR SPINE - COMPLETE WITH BENDING VIEWS; DG HIP (WITH OR WITHOUT PELVIS) 2-3V RIGHT  COMPARISON:  MRI lumbar spine 01/09/2012  FINDINGS: Non rib-bearing lumbar type vertebral bodies are present. Progressive levoconvex curvature of the lumbar spine is centered at L2-3. Compensatory rightward curvature is present at L5. Slight retrolisthesis is present at L2-3 and L3-4. There is slight anterolisthesis at L4-5. Marked facet hypertrophy is present from L3-4 through L5-S1.  Atherosclerotic calcifications are present in the aorta.  There is no significant change in listhesis associated with flexion or extension.  IMPRESSION: 1. 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 2. Retrolisthesis at L3-4 and anterolisthesis at L4-5 is new. 3. Significant facet hypertrophy at L3-4 and L4-5 in particular may contribute to radicular symptoms.  Electronically Signed   By: San Morelle M.D.   On: 04/01/2019 06:02 DG Knee (Right) CLINICAL DATA:  Low back pain extending into the hips bilaterally.  EXAM: RIGHT KNEE - 1-2 VIEW; LEFT KNEE - 1-2 VIEW; DG HIP (WITH OR WITHOUT PELVIS) 2-3V LEFT; LUMBAR SPINE - COMPLETE WITH BENDING VIEWS; DG HIP (WITH OR WITHOUT PELVIS) 2-3V RIGHT  COMPARISON:  MRI lumbar spine 01/09/2012  FINDINGS: Non rib-bearing lumbar type vertebral bodies are present. Progressive levoconvex curvature of the lumbar spine is centered at L2-3. Compensatory rightward curvature is present at L5. Slight retrolisthesis is present at L2-3 and L3-4. There is slight anterolisthesis at L4-5. Marked facet hypertrophy is present from L3-4 through  L5-S1.  Atherosclerotic calcifications are present in the aorta.  There is no significant change in listhesis associated with flexion or extension.  IMPRESSION: 1. 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 2. Retrolisthesis at L3-4 and anterolisthesis at L4-5 is new. 3. Significant facet hypertrophy at L3-4 and L4-5 in particular may contribute to radicular symptoms.  Electronically Signed   By: San Morelle M.D.   On: 04/01/2019 06:02 DG Sacroiliac Joint X-Rays CLINICAL DATA:  Sacroiliac pain.  Chronic low back pain.  EXAM: BILATERAL SACROILIAC JOINTS - 3+ VIEW  COMPARISON:  One-view pelvis 11/22/2011  FINDINGS: The sacroiliac joint spaces are maintained and there is no evidence of arthropathy. No other bone abnormalities are seen.  IMPRESSION: Negative radiographs of the sacroiliac joints.  Electronically Signed   By: San Morelle M.D.   On: 04/01/2019 05:53 Note: Reviewed        Physical Exam  General appearance: Well nourished, well developed, and well hydrated. In no apparent acute distress Mental status: Alert, oriented x 3 (person, place, & time)       Respiratory: No evidence of acute respiratory distress Eyes: PERLA Vitals: BP 125/84   Pulse 67   Temp 97.8 F (36.6 C)   Ht _0  (1.6 m)   Wt 180 lb (81.6 kg)   SpO2 99%   BMI 31.89 kg/m  BMI: Estimated body mass index is 31.89 kg/m as calculated from the following:   Height as of this encounter: _1  (1.6 m).   Weight as of this encounter: 180 lb (81.6 kg). Ideal: Ideal body weight: 52.4 kg (115 lb 8.3 oz) Adjusted ideal  body weight: 64.1 kg (141 lb 5 oz)  Cervical Spine Area Exam  Skin & Axial Inspection: No masses, redness, edema, swelling, or associated skin lesions Alignment: Symmetrical Functional ROM: Decreased ROM      Stability: No instability detected Muscle Tone/Strength: Functionally intact. No obvious neuro-muscular anomalies detected. Sensory  (Neurological): Musculoskeletal pain pattern Palpation: No palpable anomalies               Upper Extremity (UE) Exam    Side: Right upper extremity  Side: Left upper extremity  Skin & Extremity Inspection: Skin color, temperature, and hair growth are WNL. No peripheral edema or cyanosis. No masses, redness, swelling, asymmetry, or associated skin lesions. No contractures.  Skin & Extremity Inspection: Skin color, temperature, and hair growth are WNL. No peripheral edema or cyanosis. No masses, redness, swelling, asymmetry, or associated skin lesions. No contractures.  Functional ROM: Unrestricted ROM          Functional ROM: Unrestricted ROM          Muscle Tone/Strength: Functionally intact. No obvious neuro-muscular anomalies detected.  Muscle Tone/Strength: Functionally intact. No obvious neuro-muscular anomalies detected.  Sensory (Neurological): Unimpaired          Sensory (Neurological): Unimpaired          Palpation: No palpable anomalies              Palpation: No palpable anomalies              Provocative Test(s):  Phalen's test: deferred Tinel's test: deferred Apley's scratch test (touch opposite shoulder):  Action 1 (Across chest): deferred Action 2 (Overhead): deferred Action 3 (LB reach): deferred   Provocative Test(s):  Phalen's test: deferred Tinel's test: deferred Apley's scratch test (touch opposite shoulder):  Action 1 (Across chest): deferred Action 2 (Overhead): deferred Action 3 (LB reach): deferred    Lumbar Spine Area Exam  Skin & Axial Inspection: No masses, redness, or swelling Alignment: Symmetrical Functional ROM: Decreased ROM       Stability: No instability detected Muscle Tone/Strength: Functionally intact. No obvious neuro-muscular anomalies detected. Sensory (Neurological): Musculoskeletal pain pattern Palpation: No palpable anomalies       Provocative Tests: Hyperextension/rotation test: (+) bilaterally for facet joint pain. Lumbar quadrant test  (Kemp's test): (+) bilaterally for facet joint pain. Lateral bending test: deferred today       Patrick's Maneuver: deferred today                   FABER* test: deferred today                   S-I anterior distraction/compression test: deferred today         S-I lateral compression test: deferred today         S-I Thigh-thrust test: deferred today         S-I Gaenslen's test: deferred today         *(Flexion, ABduction and External Rotation) Gait & Posture Assessment  Ambulation: Unassisted Gait: Relatively normal for age and body habitus Posture: WNL  Lower Extremity Exam    Side: Right lower extremity  Side: Left lower extremity  Stability: No instability observed          Stability: No instability observed          Skin & Extremity Inspection: Skin color, temperature, and hair growth are WNL. No peripheral edema or cyanosis. No masses, redness, swelling, asymmetry, or associated skin lesions. No contractures.  Skin &  Extremity Inspection: Skin color, temperature, and hair growth are WNL. No peripheral edema or cyanosis. No masses, redness, swelling, asymmetry, or associated skin lesions. No contractures.  Functional ROM: Unrestricted ROM                  Functional ROM: Unrestricted ROM                  Muscle Tone/Strength: Functionally intact. No obvious neuro-muscular anomalies detected.  Muscle Tone/Strength: Functionally intact. No obvious neuro-muscular anomalies detected.  Sensory (Neurological): Arthropathic arthralgia        Sensory (Neurological): Arthropathic arthralgia        DTR: Patellar: deferred today Achilles: deferred today Plantar: deferred today  DTR: Patellar: deferred today Achilles: deferred today Plantar: deferred today  Palpation: No palpable anomalies  Palpation: No palpable anomalies    Assessment   Status Diagnosis  Persistent Persistent Persistent 1. Lumbar facet arthropathy   2. Lumbar spondylosis   3. Cervical facet joint syndrome   4.  Myofascial pain syndrome   5. Cervical radicular pain   6. Chronic pain syndrome   7. Chronic radicular lumbar pain   8. Chronic pain of left knee      Updated Problems: Problem  Myofascial Pain Syndrome  Lumbar Spondylosis  Chronic Radicular Lumbar Pain  Cervical Facet Joint Syndrome  Bilateral Primary Osteoarthritis of Knee  Lumbar Degenerative Disc Disease  Chronic Pain Syndrome  Chronic Low Back Pain   From work r/t injury, but per ortho notes, MRI did not show significant pathology in the lumbar spine.   Ddd (Degenerative Disc Disease), Thoracic   Significant DDD at C5-6 w/ large spurs noted on x-rays after neck/back injury at work.  MRI should significant bulging at C5-6 & C6-7.  x-rays showed significant spurring in T7, T8, T9.   Chronic Pain of Left Knee   Eval'd by Dr. Jefm Carrillo w/o significant arthritic changes.  Rec to have MRI to look for occult meniscus injury, but this wasn't done.     Plan of Care  Leslie Carrillo has a current medication list which includes the following long-term medication(s): sertraline.  Discontinue hydrocodone given side effects of hives.  Recommend Butrans patch for chronic pain management.  Discussed lumbar spine x-rays which show lumbar facet arthropathy.  Can consider diagnostic lumbar facet medial branch nerve blocks in future if Butrans patch is not helpful.  Pharmacotherapy (Medications Ordered): Meds ordered this encounter  Medications  . DISCONTD: buprenorphine (BUTRANS) 7.5 MCG/HR    Sig: Place 1 patch onto the skin every 7 (seven) days.    Dispense:  4 patch    Refill:  1    Do not place this medication, or any other prescription from our practice, on "Automatic Refill". Patient may have prescription filled one day early if pharmacy is closed on scheduled refill date. Do not fill until:  To last until:  . buprenorphine (BUTRANS) 7.5 MCG/HR    Sig: Place 1 patch onto the skin every 7 (seven) days.    Dispense:  4 patch     Refill:  1    For chronic pain syndrome   Follow-up plan:   Return in about 6 weeks (around 10/06/2019) for Medication Management, in person.     Has tried cervical and thoracic epidural steroid injections with physiatry at [facility].  Consider thoracic TPI.  Consider lumbar facet medial branch nerve blocks for severe facet arthropathy lower lumbar spine present on x-ray.  Could also consider knee intra-articular  steroid, Hyalgan, genicular nerve block      Recent Visits Date Type Provider Dept  07/09/19 Telemedicine Gillis Santa, MD [facility]  Showing recent visits within past 90 days and meeting all other requirements   Today's Visits Date Type Provider Dept  08/25/19 Office Visit Gillis Santa, MD [facility]  Showing today's visits and meeting all other requirements   Future Appointments No visits were found meeting these conditions.  Showing future appointments within next 90 days and meeting all other requirements   I discussed the assessment and treatment plan with the patient. The patient was provided an opportunity to ask questions and all were answered. The patient agreed with the plan and demonstrated an understanding of the instructions.  Patient advised to call back or seek an in-person evaluation if the symptoms or condition worsens.  Duration of encounter:30 minutes.  Note by: Gillis Santa, MD Date: 08/25/2019; Time: 1:21 PM

## 2019-08-25 NOTE — Patient Instructions (Signed)
A prescription for Butrans patch has been sent to your pharmacy. 

## 2019-10-16 ENCOUNTER — Other Ambulatory Visit: Payer: Self-pay

## 2019-10-16 ENCOUNTER — Encounter: Payer: Self-pay | Admitting: *Deleted

## 2019-10-16 DIAGNOSIS — R109 Unspecified abdominal pain: Secondary | ICD-10-CM | POA: Diagnosis not present

## 2019-10-16 DIAGNOSIS — J45909 Unspecified asthma, uncomplicated: Secondary | ICD-10-CM | POA: Diagnosis not present

## 2019-10-16 DIAGNOSIS — R319 Hematuria, unspecified: Secondary | ICD-10-CM | POA: Diagnosis not present

## 2019-10-16 DIAGNOSIS — F17200 Nicotine dependence, unspecified, uncomplicated: Secondary | ICD-10-CM | POA: Insufficient documentation

## 2019-10-16 DIAGNOSIS — Z79899 Other long term (current) drug therapy: Secondary | ICD-10-CM | POA: Diagnosis not present

## 2019-10-16 LAB — COMPREHENSIVE METABOLIC PANEL
ALT: 15 U/L (ref 0–44)
AST: 20 U/L (ref 15–41)
Albumin: 4 g/dL (ref 3.5–5.0)
Alkaline Phosphatase: 75 U/L (ref 38–126)
Anion gap: 9 (ref 5–15)
BUN: 11 mg/dL (ref 8–23)
CO2: 24 mmol/L (ref 22–32)
Calcium: 8.8 mg/dL — ABNORMAL LOW (ref 8.9–10.3)
Chloride: 104 mmol/L (ref 98–111)
Creatinine, Ser: 0.89 mg/dL (ref 0.44–1.00)
GFR calc Af Amer: >60 mL/min (ref >60)
GFR calc non Af Amer: >60 mL/min (ref >60)
Glucose, Bld: 138 mg/dL — ABNORMAL HIGH (ref 70–99)
Potassium: 3.3 mmol/L — ABNORMAL LOW (ref 3.5–5.1)
Sodium: 137 mmol/L (ref 135–145)
Total Bilirubin: 0.8 mg/dL (ref 0.3–1.2)
Total Protein: 6.9 g/dL (ref 6.5–8.1)

## 2019-10-16 LAB — CBC
HCT: 39.8 % (ref 36.0–46.0)
Hemoglobin: 13.6 g/dL (ref 12.0–15.0)
MCH: 32.6 pg (ref 26.0–34.0)
MCHC: 34.2 g/dL (ref 30.0–36.0)
MCV: 95.4 fL (ref 80.0–100.0)
Platelets: 254 10*3/uL (ref 150–400)
RBC: 4.17 MIL/uL (ref 3.87–5.11)
RDW: 12.8 % (ref 11.5–15.5)
WBC: 13.6 10*3/uL — ABNORMAL HIGH (ref 4.0–10.5)
nRBC: 0 % (ref 0.0–0.2)

## 2019-10-16 LAB — URINALYSIS, COMPLETE (UACMP) WITH MICROSCOPIC
Bacteria, UA: NONE SEEN
Bilirubin Urine: NEGATIVE
Glucose, UA: NEGATIVE mg/dL
Ketones, ur: NEGATIVE mg/dL
Leukocytes,Ua: NEGATIVE
Nitrite: NEGATIVE
Protein, ur: NEGATIVE mg/dL
Specific Gravity, Urine: 1.013 (ref 1.005–1.030)
pH: 5 (ref 5.0–8.0)

## 2019-10-16 LAB — LIPASE, BLOOD: Lipase: 29 U/L (ref 11–51)

## 2019-10-16 NOTE — ED Triage Notes (Signed)
First nurse note- here for flank pain and likely kidney stone. +hematuria.

## 2019-10-16 NOTE — ED Triage Notes (Signed)
PT to ED with right sided flank pain x 2 weeks, Pain originally started high, hear patient's ribs but over the past week the pain has moved down to her flank. Pt is not tender but reports severe pain with movement and blood in her urine. Pt also states that it is not abnormal for her to have blood in her urine due to chronic urinary issues.

## 2019-10-17 ENCOUNTER — Emergency Department: Payer: Medicare Other

## 2019-10-17 ENCOUNTER — Emergency Department
Admission: EM | Admit: 2019-10-17 | Discharge: 2019-10-17 | Disposition: A | Discharge status: Home / Self Care | Payer: Medicare Other | Attending: Emergency Medicine | Admitting: Emergency Medicine

## 2019-10-17 DIAGNOSIS — R109 Unspecified abdominal pain: Secondary | ICD-10-CM | POA: Diagnosis not present

## 2019-10-17 DIAGNOSIS — R319 Hematuria, unspecified: Secondary | ICD-10-CM

## 2019-10-17 MED ORDER — DIAZEPAM 2 MG PO TABS
2.0000 mg | ORAL_TABLET | Freq: Three times a day (TID) | ORAL | 0 refills | Status: DC | PRN
Start: 2019-10-17 — End: 2019-12-15

## 2019-10-17 MED ORDER — OXYCODONE-ACETAMINOPHEN 5-325 MG PO TABS: 1.0000 | ORAL_TABLET | ORAL | 0 refills | Status: DC | PRN

## 2019-10-17 MED ORDER — DIAZEPAM 2 MG PO TABS
2.0000 mg | ORAL_TABLET | Freq: Once | ORAL | Status: AC
  Administered 2019-10-17: 2 mg via ORAL
  Filled 2019-10-17: qty 1

## 2019-10-17 MED ORDER — FENTANYL CITRATE (PF) 100 MCG/2ML IJ SOLN
25.0000 ug | Freq: Once | INTRAMUSCULAR | Status: AC
  Administered 2019-10-17: 25 ug via INTRAVENOUS
  Filled 2019-10-17: qty 2

## 2019-10-17 MED ORDER — SODIUM CHLORIDE 0.9 % IV BOLUS
1000.0000 mL | Freq: Once | INTRAVENOUS | Status: AC
  Administered 2019-10-17: 1000 mL via INTRAVENOUS

## 2019-10-17 MED ORDER — ONDANSETRON HCL 4 MG/2ML IJ SOLN
4.0000 mg | Freq: Once | INTRAMUSCULAR | Status: AC
  Administered 2019-10-17: 4 mg via INTRAVENOUS
  Filled 2019-10-17: qty 2

## 2019-10-17 NOTE — ED Provider Notes (Signed)
[facility] Emergency Department Provider Note   ____________________________________________   First MD Initiated Contact with Patient 10/17/19 [phone]     (approximate)  I have reviewed the triage vital signs and the nursing notes.   HISTORY  Chief Complaint Flank Pain    HPI Leslie Carrillo is a 59 y.o. female who presents to the ED from home with a chief complaint of flank pain.  Patient reports right flank pain x2 to 3 weeks.  States pain originally began up high near her ribs but since has moved down to her right flank.  No prior history of kidney stones.  Patient does have kidney disease and states it is not unusual for her to have hematuria which she has now.  Denies fever, chills, cough, chest pain, shortness of breath, abdominal pain, nausea, vomiting or dysuria.  Denies recent travel or trauma.      Past Medical History:  Diagnosis Date  . Anemia   . Arthritis   . Asthma   . Chronic kidney disease   . Chronic pain     Patient Active Problem List   Diagnosis Date Noted  . Myofascial pain syndrome 05/12/2019  . Lumbar spondylosis 05/12/2019  . Chronic radicular lumbar pain 03/31/2019  . Cervical facet joint syndrome 03/31/2019  . Bilateral primary osteoarthritis of knee 03/31/2019  . Lumbar degenerative disc disease 03/31/2019  . Sacroiliac joint pain 03/31/2019  . Chronic pain syndrome 03/31/2019  . Impaired glucose tolerance 09/17/2016  . GAD (generalized anxiety disorder) 08/26/2015  . Restless leg syndrome 08/26/2015  . Leukocytosis 04/28/2014  . Chronic pain of left knee 03/19/2014  . Hypercholesterolemia 02/22/2014  . Family history of bleeding disorder 02/22/2014  . Chronic low back pain 03/20/2011  . 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 CERVICAL FUSION  2014    Prior to Admission medications   Medication Sig Start Date End Date Taking? Authorizing  Provider  Acetaminophen (TYLENOL PO) Take 500 mg by mouth as needed.    [provider]  buprenorphine (BUTRANS) 7.5 MCG/HR Place 1 patch onto the skin every 7 (seven) days. 08/25/19 10/20/19  Edward Jolly, MD  diazepam (VALIUM) 2 MG tablet Take 1 tablet (2 mg total) by mouth every 8 (eight) hours as needed for muscle spasms. 10/17/19   Irean Hong, MD  methocarbamol (ROBAXIN) 500 MG tablet Take 2 tablets (1,000 mg total) by mouth at bedtime as needed for muscle spasms. 05/12/19   Edward Jolly, MD  oxyCODONE-acetaminophen (PERCOCET/ROXICET) 5-325 MG tablet Take 1 tablet by mouth every 4 (four) hours as needed for severe pain. 10/17/19   Irean Hong, MD  sertraline (ZOLOFT) 100 MG tablet Take 100 mg by mouth daily.    [provider]    Allergies Lyrica [pregabalin], Other, Hydrocodone-acetaminophen, Tramadol, Codeine, Lovastatin, and Simvastatin  Family History  Problem Relation Age of Onset  . Dementia Mother   . Mental illness Neg Hx     Social History Social History   Tobacco Use  . Smoking status: Current Every Day Smoker    Packs/day: 0.50    Years: 20.00    Pack years: 10.00  . Smokeless tobacco: Never Used  Vaping Use  . Vaping Use: Never used  Substance Use Topics  . Alcohol use: Never  . Drug use: Never    Review of Systems  Constitutional: No fever/chills Eyes: No visual changes. ENT: No sore throat. Cardiovascular:  Denies chest pain. Respiratory: Denies shortness of breath. Gastrointestinal: Positive for right flank pain.  No abdominal pain.  No nausea, no vomiting.  No diarrhea.  No constipation. Genitourinary: Negative for dysuria. Musculoskeletal: Negative for back pain. Skin: Negative for rash. Neurological: Negative for headaches, focal weakness or numbness.   ____________________________________________   PHYSICAL EXAM:  VITAL SIGNS: ED Triage Vitals  Enc Vitals Group     BP 10/16/19 1931 126/70     Pulse Rate 10/16/19 1931 71       Resp 10/16/19 1931 16     Temp 10/16/19 1931 98.4 F (36.9 C)     Temp Source 10/16/19 1931 Oral     SpO2 10/16/19 1931 97 %     Weight 10/16/19 1932 177 lb (80.3 kg)     Height 10/16/19 1932 5\' 3"  (1.6 m)     Head Circumference --      Peak Flow --      Pain Score 10/16/19 1932 10     Pain Loc --      Pain Edu? --      Excl. in GC? --     Constitutional: Alert and oriented. Well appearing and in mild acute distress. Eyes: Conjunctivae are normal. PERRL. EOMI. Head: Atraumatic. Nose: No congestion/rhinnorhea. Mouth/Throat: Mucous membranes are moist.   Neck: No stridor.   Cardiovascular: Normal rate, regular rhythm. Grossly normal heart sounds.  Good peripheral circulation. Respiratory: Normal respiratory effort.  No retractions. Lungs CTAB. Gastrointestinal: Soft and nontender to light or deep palpation. No distention. No abdominal bruits. No CVA tenderness. Musculoskeletal: No lower extremity tenderness nor edema.  No joint effusions. Neurologic:  Normal speech and language. No gross focal neurologic deficits are appreciated. No gait instability. Skin:  Skin is warm, dry and intact. No rash noted.  No vesicles. Psychiatric: Mood and affect are normal. Speech and behavior are normal.  ____________________________________________   LABS (all labs ordered are listed, but only abnormal results are displayed)  Labs Reviewed  COMPREHENSIVE METABOLIC PANEL - Abnormal; Notable for the following components:      Result Value   Potassium 3.3 (*)    Glucose, Bld 138 (*)    Calcium 8.8 (*)    All other components within normal limits  CBC - Abnormal; Notable for the following components:   WBC 13.6 (*)    All other components within normal limits  URINALYSIS, COMPLETE (UACMP) WITH MICROSCOPIC - Abnormal; Notable for the following components:   Color, Urine YELLOW (*)    APPearance CLEAR (*)    Hgb urine dipstick LARGE (*)    All other components within normal limits   LIPASE, BLOOD   ____________________________________________  EKG  None ____________________________________________  RADIOLOGY  ED MD interpretation: Unremarkable CT  Official radiology report(s): CT Renal Stone Study  Result Date: 10/17/2019 CLINICAL DATA:  Right-sided flank pain EXAM: CT ABDOMEN AND PELVIS WITHOUT CONTRAST TECHNIQUE: Multidetector CT imaging of the abdomen and pelvis was performed following the standard protocol without IV contrast. COMPARISON:  None. FINDINGS: Lower chest: The visualized heart size within normal limits. No pericardial fluid/thickening. No hiatal hernia. The visualized portions of the lungs are clear. Hepatobiliary: Although limited due to the lack of intravenous contrast, normal in appearance without gross focal abnormality. The patient is status post cholecystectomy. No biliary ductal dilation. Pancreas:  Unremarkable.  No surrounding inflammatory changes. Spleen: Normal in size. Although limited due to the lack of intravenous contrast, normal in appearance. Adrenals/Urinary Tract: Both adrenal glands appear normal.  The kidneys and collecting system appear normal without evidence of urinary tract calculus or hydronephrosis. Bladder is unremarkable. Stomach/Bowel: The stomach, small bowel, and colon are normal in appearance. No inflammatory changes or obstructive findings. appendix is normal. Vascular/Lymphatic: There are no enlarged abdominal or pelvic lymph nodes. Scattered aortic atherosclerotic calcifications are seen without aneurysmal dilatation. Reproductive: Coarse calcifications seen in the posterior left uterus. Other: No evidence of abdominal wall mass or hernia. Musculoskeletal: No acute or significant osseous findings. IMPRESSION: No renal or collecting system calculi. No other acute intra-abdominal or pelvic pathology to explain the patient's symptoms. Aortic Atherosclerosis (ICD10-I70.0). Electronically Signed   By: Jonna Clark M.D.   On:  10/17/2019 00:27    ____________________________________________   PROCEDURES  Procedure(s) performed (including Critical Care):  Procedures   ____________________________________________   INITIAL IMPRESSION / ASSESSMENT AND PLAN / ED COURSE  As part of my medical decision making, I reviewed the following data within the electronic MEDICAL RECORD NUMBER History obtained from family, Nursing notes reviewed and incorporated, Labs reviewed, Old chart reviewed, Radiograph reviewed, Notes from prior ED visits and [place] Controlled Substance Database     Leslie Carrillo was evaluated in Emergency Department on 10/17/2019 for the symptoms described in the history of present illness. She was evaluated in the context of the global COVID-19 pandemic, which necessitated consideration that the patient might be at risk for infection with the SARS-CoV-2 virus that causes COVID-19. Institutional protocols and algorithms that pertain to the evaluation of patients at risk for COVID-19 are in a state of rapid change based on information released by regulatory bodies including the CDC and federal and state organizations. These policies and algorithms were followed during the patient's care in the ED.    59 year old female presenting with nontraumatic right flank pain. Differential diagnosis includes, but is not limited to, ovarian cyst, ovarian torsion, acute appendicitis, diverticulitis, urinary tract infection/pyelonephritis, endometriosis, bowel obstruction, colitis, renal colic, gastroenteritis, hernia, fibroids, etc.  Laboratory and CT scan largely unremarkable.  During our interview patient has intermittent waves of pain and muscle spasms.  Will administer low-dose IV fentanyl and oral Valium.   Clinical Course as of Oct 16 624  Sat Oct 17, 2019  0254 Patient feeling better.  Will discharge home on Percocet and Valium.  Have asked her to discontinue her Methocarbamol while she is taking Valium.  Strict  return precautions given.  Patient and spouse verbalized understanding and agree with plan of care.   [JS]    Clinical Course User Index [JS] Irean Hong, MD     ____________________________________________   FINAL CLINICAL IMPRESSION(S) / ED DIAGNOSES  Final diagnoses:  Right flank pain  Hematuria, unspecified type     ED Discharge Orders         Ordered    oxyCODONE-acetaminophen (PERCOCET/ROXICET) 5-325 MG tablet  Every 4 hours PRN     Discontinue  Reprint     10/17/19 0313    diazepam (VALIUM) 2 MG tablet  Every 8 hours PRN     Discontinue  Reprint     10/17/19 0313           Note:  This document was prepared using Dragon voice recognition software and may include unintentional dictation errors.   Irean Hong, MD 10/17/19 [phone]

## 2019-10-17 NOTE — ED Notes (Signed)
ED Provider at bedside. 

## 2019-10-17 NOTE — Discharge Instructions (Addendum)
1.  You may take medicines as needed for pain and muscle spasms (Percocet/Valium #15).  Do not take both Methocarbamol and Valium at the same time. 2.  Apply moist heat to affected area several times daily. 3.  Return to the ER for worsening symptoms, persistent vomiting, difficulty breathing or other concerns.

## 2019-10-20 ENCOUNTER — Other Ambulatory Visit: Payer: Self-pay

## 2019-10-20 ENCOUNTER — Ambulatory Visit
Payer: Medicare Other | Attending: Student in an Organized Health Care Education/Training Program | Admitting: Student in an Organized Health Care Education/Training Program

## 2019-10-20 ENCOUNTER — Encounter: Payer: Self-pay | Admitting: Student in an Organized Health Care Education/Training Program

## 2019-10-20 VITALS — BP 122/83 | HR 65 | Temp 97.0°F | Resp 18 | Ht 63.0 in | Wt 177.0 lb

## 2019-10-20 DIAGNOSIS — M47816 Spondylosis without myelopathy or radiculopathy, lumbar region: Secondary | ICD-10-CM | POA: Insufficient documentation

## 2019-10-20 DIAGNOSIS — M5416 Radiculopathy, lumbar region: Secondary | ICD-10-CM | POA: Diagnosis present

## 2019-10-20 DIAGNOSIS — M25562 Pain in left knee: Secondary | ICD-10-CM | POA: Diagnosis present

## 2019-10-20 DIAGNOSIS — G8929 Other chronic pain: Secondary | ICD-10-CM | POA: Diagnosis present

## 2019-10-20 DIAGNOSIS — M5136 Other intervertebral disc degeneration, lumbar region: Secondary | ICD-10-CM | POA: Insufficient documentation

## 2019-10-20 DIAGNOSIS — M7918 Myalgia, other site: Secondary | ICD-10-CM | POA: Diagnosis not present

## 2019-10-20 DIAGNOSIS — G894 Chronic pain syndrome: Secondary | ICD-10-CM | POA: Diagnosis present

## 2019-10-20 DIAGNOSIS — M47812 Spondylosis without myelopathy or radiculopathy, cervical region: Secondary | ICD-10-CM | POA: Insufficient documentation

## 2019-10-20 DIAGNOSIS — M5412 Radiculopathy, cervical region: Secondary | ICD-10-CM | POA: Diagnosis not present

## 2019-10-20 MED ORDER — OXYCODONE-ACETAMINOPHEN 5-325 MG PO TABS: 1.0000 | ORAL_TABLET | Freq: Every day | ORAL | 0 refills | Status: DC | PRN

## 2019-10-20 MED ORDER — BUPRENORPHINE 10 MCG/HR TD PTWK: 1.0000 | MEDICATED_PATCH | TRANSDERMAL | 1 refills | Status: DC

## 2019-10-20 NOTE — Progress Notes (Signed)
PROVIDER NOTE: Information contained herein reflects review and annotations entered in association with encounter. Interpretation of such information and data should be left to medically-trained personnel. Information provided to patient can be located elsewhere in the medical record under "Patient Instructions". Document created using STT-dictation technology, any transcriptional errors that may result from process are unintentional.    Patient: Leslie Carrillo  Service Category: E/M  Provider: Gillis Santa, MD  DOB: 1960-08-31  DOS: 10/20/2019  Specialty: Interventional Pain Management  MRN: [id number]  Setting: Ambulatory outpatient  PCP: Baxter Hire, MD  Type: Established Patient    Referring Provider: Baxter Hire, MD  Location: Office  Delivery: Face-to-face     HPI  Reason for encounter: Leslie Carrillo, a 59 y.o. year old female, is here today for evaluation and management of her Lumbar facet arthropathy [M47.816]. Leslie Carrillo primary complain today is Back Pain (lower) Last encounter: Practice (08/25/2019). My last encounter with her was on 08/25/2019. Pertinent problems: Leslie Carrillo has Chronic low back pain; DDD (degenerative disc disease), thoracic; Chronic radicular lumbar pain; Cervical facet joint syndrome; Bilateral primary osteoarthritis of knee; Lumbar degenerative disc disease; Chronic pain syndrome; Myofascial pain syndrome; and Lumbar facet arthropathy on their pertinent problem list. Pain Assessment: Severity of   is reported as a  /10. Location:    / . Onset:  . Quality:  . Timing:  . Modifying factor(s):  Marland Kitchen Vitals:  height is _0  (1.6 m) and weight is 177 lb (80.3 kg). Her temporal temperature is 97 F (36.1 C) (abnormal). Her blood pressure is 122/83 and her pulse is 65. Her respiration is 18 and oxygen saturation is 98%.   Right flank pain started a couple of weeks ago, +hematuria went to ED had CT scan done which was negative for nephrolithiasis. Have significant spasms  on the right flank side. ED started Valium and had her discontinue Robaxin. At the ED, she also received her prescription of Percocet which she states that she has taken when she has severe spasms. She is requesting a slight increase on her Butrans patch which she states has been helpful for her in managing her chronic pain and improving her functional status. She is also requesting a small prescription of Percocet that she can have available if she has a severe pain flare   Pharmacotherapy Assessment   Butrans patch will increase from 7.5 micrograms an hour to 10 micrograms an hour.  Monitoring: [place] PMP: PDMP reviewed during this encounter.       Pharmacotherapy: No side-effects or adverse reactions reported. Compliance: No problems identified. Effectiveness: Clinically acceptable.  Landis Martins, RN  10/20/2019 10:51 AM  Sign when Signing Visit Nursing Pain Medication Assessment:  Safety precautions to be maintained throughout the outpatient stay will include: orient to surroundings, keep bed in low position, maintain call bell within reach at all times, provide assistance with transfer out of bed and ambulation.  Medication Inspection Compliance: Pill count conducted under aseptic conditions, in front of the patient. Neither the pills nor the bottle was removed from the patient's sight at any time. Once count was completed pills were immediately returned to the patient in their original bottle.  Medication #1: Butrans patch Pill/Patch Count: 0 of 4 pills remain Pill/Patch Appearance: Markings consistent with prescribed medication Bottle Appearance: Standard pharmacy container. Clearly labeled. Filled Date: 09/22/2019 Last Medication intake:  Last patch applied 5 days ago  Medication #2: Oxycodone/APAP Pill/Patch Count: 13 of 15 pills remain Pill/Patch  Appearance: Markings consistent with prescribed medication Bottle Appearance: Standard pharmacy container. Clearly labeled. Filled  Date: 10/17/19 Last Medication intake:  Yesterday   Medication #3 Diazepam 14 of 15 pills remain Filled 10/17/19 Last taken 2 days ago    UDS:  Summary  Date Value Ref Range Status  03/31/2019 Note  Final    Comment:    ==================================================================== Compliance Drug Analysis, Ur ==================================================================== Test                             Result       Flag       Units Drug Present and Declared for Prescription Verification   Tramadol                       >7042        EXPECTED   ng/mg creat   O-Desmethyltramadol            >7042        EXPECTED   ng/mg creat   N-Desmethyltramadol            2638         EXPECTED   ng/mg creat    Source of tramadol is a prescription medication. O-desmethyltramadol    and N-desmethyltramadol are expected metabolites of tramadol.   Sertraline                     PRESENT      EXPECTED   Desmethylsertraline            PRESENT      EXPECTED    Desmethylsertraline is an expected metabolite of sertraline.   Acetaminophen                  PRESENT      EXPECTED Drug Absent but Declared for Prescription Verification   Methocarbamol                  Not Detected UNEXPECTED ==================================================================== Test                      Result    Flag   Units      Ref Range   Creatinine              71               mg/dL      >=20 ==================================================================== Declared Medications:  The flagging and interpretation on this report are based on the  following declared medications.  Unexpected results may arise from  inaccuracies in the declared medications.  **Note: The testing scope of this panel includes these medications:  Methocarbamol  Sertraline  Tramadol  **Note: The testing scope of this panel does not include small to  moderate amounts of these reported medications:   Acetaminophen ==================================================================== For clinical consultation, please call [phone]. ====================================================================      ROS  Constitutional: Denies any fever or chills Gastrointestinal: No reported hemesis, hematochezia, vomiting, or acute GI distress positive hematuria Musculoskeletal: Right flank pain, right low back pain Neurological: No reported episodes of acute onset apraxia, aphasia, dysarthria, agnosia, amnesia, paralysis, loss of coordination, or loss of consciousness  Medication Review  Acetaminophen, buprenorphine, diazepam, methocarbamol, oxyCODONE-acetaminophen, and sertraline  History Review  Allergy: Leslie Carrillo is allergic to lyrica [pregabalin], other, hydrocodone-acetaminophen, tramadol, codeine, lovastatin, and simvastatin. Drug: Leslie Carrillo  reports no history of drug  use. Alcohol:  reports no history of alcohol use. Tobacco:  reports that she has been smoking. She has a 10.00 pack-year smoking history. She has never used smokeless tobacco. Social: Leslie Carrillo  reports that she has been smoking. She has a 10.00 pack-year smoking history. She has never used smokeless tobacco. She reports that she does not drink alcohol and does not use drugs. Medical:  has a past medical history of Anemia, Arthritis, Asthma, Chronic kidney disease, and Chronic pain. Surgical: Leslie Carrillo  has a past surgical history that includes Cervical fusion (2014). Family: family history includes Dementia in her mother.  Laboratory Chemistry Profile   Renal Lab Results  Component Value Date   BUN 11 10/16/2019   CREATININE 0.89 10/16/2019   GFRAA >60 10/16/2019   GFRNONAA >60 10/16/2019     Hepatic Lab Results  Component Value Date   AST 20 10/16/2019   ALT 15 10/16/2019   ALBUMIN 4.0 10/16/2019   ALKPHOS 75 10/16/2019   LIPASE 29 10/16/2019     Electrolytes Lab Results  Component Value Date    NA 137 10/16/2019   K 3.3 (L) 10/16/2019   CL 104 10/16/2019   CALCIUM 8.8 (L) 10/16/2019     Bone No results found for: VD25OH, VD125OH2TOT, VZ5638VF6, EP3295JO8, 25OHVITD1, 25OHVITD2, 25OHVITD3, TESTOFREE, TESTOSTERONE   Inflammation (CRP: Acute Phase) (ESR: Chronic Phase) No results found for: CRP, ESRSEDRATE, LATICACIDVEN     Note: Above Lab results reviewed.  Recent Imaging Review  CT Renal Stone Study CLINICAL DATA:  Right-sided flank pain  EXAM: CT ABDOMEN AND PELVIS WITHOUT CONTRAST  TECHNIQUE: Multidetector CT imaging of the abdomen and pelvis was performed following the standard protocol without IV contrast.  COMPARISON:  None.  FINDINGS: Lower chest: The visualized heart size within normal limits. No pericardial fluid/thickening.  No hiatal hernia.  The visualized portions of the lungs are clear.  Hepatobiliary: Although limited due to the lack of intravenous contrast, normal in appearance without gross focal abnormality. The patient is status post cholecystectomy. No biliary ductal dilation.  Pancreas:  Unremarkable.  No surrounding inflammatory changes.  Spleen: Normal in size. Although limited due to the lack of intravenous contrast, normal in appearance.  Adrenals/Urinary Tract: Both adrenal glands appear normal. The kidneys and collecting system appear normal without evidence of urinary tract calculus or hydronephrosis. Bladder is unremarkable.  Stomach/Bowel: The stomach, small bowel, and colon are normal in appearance. No inflammatory changes or obstructive findings. appendix is normal.  Vascular/Lymphatic: There are no enlarged abdominal or pelvic lymph nodes. Scattered aortic atherosclerotic calcifications are seen without aneurysmal dilatation.  Reproductive: Coarse calcifications seen in the posterior left uterus.  Other: No evidence of abdominal wall mass or hernia.  Musculoskeletal: No acute or significant osseous  findings.  IMPRESSION: No renal or collecting system calculi.  No other acute intra-abdominal or pelvic pathology to explain the patient's symptoms.  Aortic Atherosclerosis (ICD10-I70.0).  Electronically Signed   By: Prudencio Pair M.D.   On: 10/17/2019 00:27 Note: Reviewed        Physical Exam  General appearance: Well nourished, well developed, and well hydrated. In no apparent acute distress Mental status: Alert, oriented x 3 (person, place, & time)       Respiratory: No evidence of acute respiratory distress Eyes: PERLA Vitals: BP 122/83   Pulse 65   Temp (!) 97 F (36.1 C) (Temporal)   Resp 18   Ht _0  (1.6 m)   Wt 177 lb (80.3 kg)  SpO2 98%   BMI 31.35 kg/m  BMI: Estimated body mass index is 31.35 kg/m as calculated from the following:   Height as of this encounter: _0  (1.6 m).   Weight as of this encounter: 177 lb (80.3 kg). Ideal: Ideal body weight: 52.4 kg (115 lb 8.3 oz) Adjusted ideal body weight: 63.6 kg (140 lb 1.8 oz)    Provocative Test(s):  Phalen's test: deferred Tinel's test: deferred Apley's scratch test (touch opposite shoulder):  Action 1 (Across chest): deferred Action 2 (Overhead): deferred Action 3 (LB reach): deferred    Lumbar Spine Area Exam  Skin & Axial Inspection: No masses, redness, or swelling Alignment: Symmetrical Functional ROM: Decreased ROM       Stability: No instability detected Muscle Tone/Strength: Functionally intact. No obvious neuro-muscular anomalies detected. Sensory (Neurological): Musculoskeletal pain pattern Palpation: No palpable anomalies       Provocative Tests: Hyperextension/rotation test: (+) bilaterally for facet joint pain. Lumbar quadrant test (Kemp's test): (+) bilaterally for facet joint pain. Lateral bending test: deferred today       Patrick's Maneuver: deferred today                   FABER* test: deferred today                   S-I anterior distraction/compression test: deferred today          S-I lateral compression test: deferred today         S-I Thigh-thrust test: deferred today         S-I Gaenslen's test: deferred today         *(Flexion, ABduction and External Rotation)  Positive right flank pain  Gait & Posture Assessment  Ambulation: Unassisted Gait: Relatively normal for age and body habitus Posture: WNL  Lower Extremity Exam    Side: Right lower extremity  Side: Left lower extremity  Stability: No instability observed          Stability: No instability observed          Skin & Extremity Inspection: Skin color, temperature, and hair growth are WNL. No peripheral edema or cyanosis. No masses, redness, swelling, asymmetry, or associated skin lesions. No contractures.  Skin & Extremity Inspection: Skin color, temperature, and hair growth are WNL. No peripheral edema or cyanosis. No masses, redness, swelling, asymmetry, or associated skin lesions. No contractures.  Functional ROM: Unrestricted ROM                  Functional ROM: Unrestricted ROM                  Muscle Tone/Strength: Functionally intact. No obvious neuro-muscular anomalies detected.  Muscle Tone/Strength: Functionally intact. No obvious neuro-muscular anomalies detected.  Sensory (Neurological): Arthropathic arthralgia        Sensory (Neurological): Arthropathic arthralgia        DTR: Patellar: deferred today Achilles: deferred today Plantar: deferred today  DTR: Patellar: deferred today Achilles: deferred today Plantar: deferred today  Palpation: No palpable anomalies  Palpation: No palpable anomalies     Assessment   Status Diagnosis  Controlled Controlled Controlled 1. Lumbar facet arthropathy   2. Lumbar spondylosis   3. Cervical facet joint syndrome   4. Myofascial pain syndrome   5. Cervical radicular pain   6. Chronic radicular lumbar pain   7. Lumbar degenerative disc disease   8. Chronic pain of left knee   9. Chronic pain syndrome  Updated  Problems: Problem  Lumbar Facet Arthropathy   Plan of Care   Leslie Carrillo has a current medication list which includes the following long-term medication(s): sertraline.  1.  Encourage hydration 2. Increase Butrans patch from 7.5 to 10 micrograms an hour 3. Short prescription for Percocet as below for acute pain related to spasms and flank pain 4. Okay to continue Robaxin for muscle spasms. Do not take concurrently with Valium.  Pharmacotherapy (Medications Ordered): Meds ordered this encounter  Medications  . buprenorphine (BUTRANS) 10 MCG/HR PTWK    Sig: Place 1 patch onto the skin every 7 (seven) days.    Dispense:  4 patch    Refill:  1    For chronic pain syndrome  . oxyCODONE-acetaminophen (PERCOCET) 5-325 MG tablet    Sig: Take 1 tablet by mouth daily as needed for up to 20 days for severe pain.    Dispense:  20 tablet    Refill:  0    Chronic Pain. (STOP Act - Not applicable). Fill one day early if closed on scheduled refill date.   Follow-up plan:   Return in about 8 weeks (around 12/15/2019) for Medication Management, in person.     Has tried cervical and thoracic epidural steroid injections with physiatry at [facility].  Consider thoracic TPI.  Consider lumbar facet medial branch nerve blocks for severe facet arthropathy lower lumbar spine present on x-ray.  Could also consider knee intra-articular steroid, Hyalgan, genicular nerve block       Recent Visits Date Type Provider Dept  08/25/19 Office Visit Gillis Santa, MD [facility]  Showing recent visits within past 90 days and meeting all other requirements Today's Visits Date Type Provider Dept  10/20/19 Office Visit Gillis Santa, MD [facility]  Showing today's visits and meeting all other requirements Future Appointments No visits were found meeting these conditions. Showing future appointments within next 90 days and meeting all other requirements  I discussed the assessment and treatment  plan with the patient. The patient was provided an opportunity to ask questions and all were answered. The patient agreed with the plan and demonstrated an understanding of the instructions.  Patient advised to call back or seek an in-person evaluation if the symptoms or condition worsens.  Duration of encounter: 30 minutes.  Note by: Gillis Santa, MD Date: 10/20/2019; Time: 11:10 AM

## 2019-10-20 NOTE — Patient Instructions (Signed)
Prescriptions for Butrans patch and Percocet have been sent to your pharmacy.

## 2019-10-20 NOTE — Progress Notes (Signed)
Nursing Pain Medication Assessment:  Safety precautions to be maintained throughout the outpatient stay will include: orient to surroundings, keep bed in low position, maintain call bell within reach at all times, provide assistance with transfer out of bed and ambulation.  Medication Inspection Compliance: Pill count conducted under aseptic conditions, in front of the patient. Neither the pills nor the bottle was removed from the patient's sight at any time. Once count was completed pills were immediately returned to the patient in their original bottle.  Medication #1: Butrans patch Pill/Patch Count: 0 of 4 pills remain Pill/Patch Appearance: Markings consistent with prescribed medication Bottle Appearance: Standard pharmacy container. Clearly labeled. Filled Date: 09/22/2019 Last Medication intake:  Last patch applied 5 days ago  Medication #2: Oxycodone/APAP Pill/Patch Count: 13 of 15 pills remain Pill/Patch Appearance: Markings consistent with prescribed medication Bottle Appearance: Standard pharmacy container. Clearly labeled. Filled Date: 10/17/19 Last Medication intake:  Yesterday   Medication #3 Diazepam 14 of 15 pills remain Filled 10/17/19 Last taken 2 days ago

## 2019-10-27 NOTE — Progress Notes (Signed)
10/28/2019 1:37 PM   Vincenza Hews 22-Jan-1961 [id number]  Referring provider: Gracelyn Nurse, MD [address] [place],  [place] [address] Chief Complaint  Patient presents with  . Hematuria  . Nephrolithiasis    HPI: Leslie Carrillo is a 59 y.o. female who presents today to be evaluated and managed for hematuria.   The patient was seen in the ED on 10/17/2019 for right sided flank pain. Pain was ongoing for 2-3 weeks. Stated pain originally began up high near her ribs but since had moved down to her right flank.  Patient does have kidney disease and stated it was not unusual for her to have persistent microscopic hematuria dating back to 04/2018.  Denied fever, chills, abdominal pain, nausea, vomiting or dysuria. She was administered low-dose IV fentanyl and oral Valium.   UA showed large blood, 6-10 RBCs, mucus present.   CT renal stone study revealed no renal or collecting system calculi. No other acute intra-abdominal or pelvic pathology to explain the patient's symptoms.   She reports having peachy color hematuria. She has more hematuria in the AM. She reports that she has been having pain. She is unsure if she has passed a stone but noted having some nausea and vomiting. She has noted issues of hematuria back in 09/2019 but she followed up with her PCP.   She reports some itching related to yeast infection that has been persistent for 3 months.   She reports a lot of family related stress. She is currently taking care of her mother ever week.  Her husband is currently undergoing cardiac cath.  She is taking care of her grandchildren.  No prior history of kidney stones.   She has a 25+ pack smoking history. She smokes 3/4 of a pack per day. When stressed she smokes 1 ppd.    PMH: Past Medical History:  Diagnosis Date  . Anemia   . Arthritis   . Asthma   . Chronic kidney disease   . Chronic pain     Surgical History: Past Surgical History:  Procedure  Laterality Date  . CERVICAL FUSION  2014    Home Medications:  Allergies as of 10/28/2019      Reactions   Lyrica [pregabalin]    Rapid heart beat   Other Nausea Only, Nausea And Vomiting, Anaphylaxis   Hydrocodone-acetaminophen Rash   Rashes all over her body   Tramadol Rash   At high dosage    Codeine Nausea And Vomiting   Lovastatin Other (See Comments)   "i just feel ill"   Simvastatin Other (See Comments)   "i just feel ill"      Medication List       Accurate as of October 28, 2019  1:37 PM. If you have any questions, ask your nurse or doctor.        STOP taking these medications   methocarbamol 500 MG tablet Commonly known as: ROBAXIN Stopped by: Vanna Scotland, MD   TYLENOL PO Stopped by: Vanna Scotland, MD     TAKE these medications   buprenorphine 10 MCG/HR Ptwk Commonly known as: BUTRANS Place 1 patch onto the skin every 7 (seven) days.   diazepam 2 MG tablet Commonly known as: Valium Take 1 tablet (2 mg total) by mouth every 8 (eight) hours as needed for muscle spasms.   hydrOXYzine 25 MG tablet Commonly known as: ATARAX/VISTARIL Take by mouth.   oxyCODONE-acetaminophen 5-325 MG tablet Commonly known as: Percocet Take 1 tablet  by mouth daily as needed for up to 20 days for severe pain. What changed: Another medication with the same name was removed. Continue taking this medication, and follow the directions you see here. Changed by: Vanna Scotland, MD   sertraline 100 MG tablet Commonly known as: ZOLOFT Take 100 mg by mouth daily.   triamcinolone cream 0.5 % Commonly known as: KENALOG Apply topically.       Allergies:  Allergies  Allergen Reactions  . Lyrica [Pregabalin]     Rapid heart beat  . Other Nausea Only, Nausea And Vomiting and Anaphylaxis  . Hydrocodone-Acetaminophen Rash    Rashes all over her body   . Tramadol Rash    At high dosage   . Codeine Nausea And Vomiting  . Lovastatin Other (See Comments)    "i just feel  ill"  . Simvastatin Other (See Comments)    "i just feel ill"    Family History: Family History  Problem Relation Age of Onset  . Dementia Mother   . Renal Cyst Father   . Prostate cancer Father   . Mental illness Neg Hx     Social History:  reports that she has been smoking. She has a 10.00 pack-year smoking history. She has never used smokeless tobacco. She reports that she does not drink alcohol and does not use drugs.   Physical Exam: BP 117/79   Pulse 66   Ht 5\' 3"  (1.6 m)   Wt 180 lb (81.6 kg)   BMI 31.89 kg/m   Constitutional:  Alert and oriented, No acute distress. HEENT: [place] AT, moist mucus membranes.  Trachea midline, no masses. Cardiovascular: No clubbing, cyanosis, or edema. Respiratory: Normal respiratory effort, no increased work of breathing. Skin: No rashes, bruises or suspicious lesions. Neurologic: Grossly intact, no focal deficits, moving all 4 extremities. Psychiatric: Stressed, almost tearful at times.  Laboratory Data:  Lab Results  Component Value Date   CREATININE 0.89 10/16/2019    Urinalysis 3-10 RBC/ HFP, otherwise negative  Pertinent Imaging:  Results for orders placed during the hospital encounter of 10/17/19  CT Renal Stone Study  Narrative CLINICAL DATA:  Right-sided flank pain  EXAM: CT ABDOMEN AND PELVIS WITHOUT CONTRAST  TECHNIQUE: Multidetector CT imaging of the abdomen and pelvis was performed following the standard protocol without IV contrast.  COMPARISON:  None.  FINDINGS: Lower chest: The visualized heart size within normal limits. No pericardial fluid/thickening.  No hiatal hernia.  The visualized portions of the lungs are clear.  Hepatobiliary: Although limited due to the lack of intravenous contrast, normal in appearance without gross focal abnormality. The patient is status post cholecystectomy. No biliary ductal dilation.  Pancreas:  Unremarkable.  No surrounding inflammatory changes.  Spleen: Normal  in size. Although limited due to the lack of intravenous contrast, normal in appearance.  Adrenals/Urinary Tract: Both adrenal glands appear normal. The kidneys and collecting system appear normal without evidence of urinary tract calculus or hydronephrosis. Bladder is unremarkable.  Stomach/Bowel: The stomach, small bowel, and colon are normal in appearance. No inflammatory changes or obstructive findings. appendix is normal.  Vascular/Lymphatic: There are no enlarged abdominal or pelvic lymph nodes. Scattered aortic atherosclerotic calcifications are seen without aneurysmal dilatation.  Reproductive: Coarse calcifications seen in the posterior left uterus.  Other: No evidence of abdominal wall mass or hernia.  Musculoskeletal: No acute or significant osseous findings.  IMPRESSION: No renal or collecting system calculi.  No other acute intra-abdominal or pelvic pathology to explain the patient's symptoms.  Aortic Atherosclerosis (ICD10-I70.0).   Electronically Signed By: Jonna Clark M.D. On: 10/17/2019 00:27   I have personally reviewed the images and agree with radiologist interpretation.    Assessment & Plan:    1. Microscopic hematuria Patient falls in the high risk category given her age and extensive smoking history.   We discussed the differential diagnosis for microscopic hematuria including nephrolithiasis, renal or upper tract tumors, bladder stones, UTIs, or bladder tumors as well as undetermined etiologies. Per AUA guidelines, I did recommend complete microscopic hematuria evaluation including CTU, possible urine cytology, and office cystoscopy.  Patient agreed.   [facility] [address] [place], [place] [address] [phone]  I, Theador Hawthorne, am acting as a scribe for Dr. Vanna Scotland.  I have reviewed the above documentation for accuracy and completeness, and I agree with the above.   Vanna Scotland, MD

## 2019-10-28 ENCOUNTER — Encounter: Payer: Self-pay | Admitting: Urology

## 2019-10-28 ENCOUNTER — Other Ambulatory Visit: Payer: Self-pay

## 2019-10-28 ENCOUNTER — Ambulatory Visit (INDEPENDENT_AMBULATORY_CARE_PROVIDER_SITE_OTHER): Payer: Medicare Other | Admitting: Urology

## 2019-10-28 VITALS — BP 117/79 | HR 66 | Ht 63.0 in | Wt 180.0 lb

## 2019-10-28 DIAGNOSIS — R319 Hematuria, unspecified: Secondary | ICD-10-CM

## 2019-10-28 NOTE — Patient Instructions (Signed)
Cystoscopy Cystoscopy is a procedure that is used to help diagnose and sometimes treat conditions that affect the lower urinary tract. The lower urinary tract includes the bladder and the urethra. The urethra is the tube that drains urine from the bladder. Cystoscopy is done using a thin, tube-shaped instrument with a light and camera at the end (cystoscope). The cystoscope may be hard or flexible, depending on the goal of the procedure. The cystoscope is inserted through the urethra, into the bladder. Cystoscopy may be recommended if you have:  Urinary tract infections that keep coming back.  Blood in the urine (hematuria).  An inability to control when you urinate (urinary incontinence) or an overactive bladder.  Unusual cells found in a urine sample.  A blockage in the urethra, such as a urinary stone.  Painful urination.  An abnormality in the bladder found during an intravenous pyelogram (IVP) or CT scan. Cystoscopy may also be done to remove a sample of tissue to be examined under a microscope (biopsy). What are the risks? Generally, this is a safe procedure. However, problems may occur, including:  Infection.  Bleeding.  What happens during the procedure?  1. You will be given one or more of the following: ? A medicine to numb the area (local anesthetic). 2. The area around the opening of your urethra will be cleaned. 3. The cystoscope will be passed through your urethra into your bladder. 4. Germ-free (sterile) fluid will flow through the cystoscope to fill your bladder. The fluid will stretch your bladder so that your health care provider can clearly examine your bladder walls. 5. Your doctor will look at the urethra and bladder. 6. The cystoscope will be removed The procedure may vary among health care providers  What can I expect after the procedure? After the procedure, it is common to have: 1. Some soreness or pain in your abdomen and urethra. 2. Urinary symptoms.  These include: ? Mild pain or burning when you urinate. Pain should stop within a few minutes after you urinate. This may last for up to 1 week. ? A small amount of blood in your urine for several days. ? Feeling like you need to urinate but producing only a small amount of urine. Follow these instructions at home: General instructions  Return to your normal activities as told by your health care provider.   Do not drive for 24 hours if you were given a sedative during your procedure.  Watch for any blood in your urine. If the amount of blood in your urine increases, call your health care provider.  If a tissue sample was removed for testing (biopsy) during your procedure, it is up to you to get your test results. Ask your health care provider, or the department that is doing the test, when your results will be ready.  Drink enough fluid to keep your urine pale yellow.  Keep all follow-up visits as told by your health care provider. This is important. Contact a health care provider if you:  Have pain that gets worse or does not get better with medicine, especially pain when you urinate.  Have trouble urinating.  Have more blood in your urine. Get help right away if you:  Have blood clots in your urine.  Have abdominal pain.  Have a fever or chills.  Are unable to urinate. Summary  Cystoscopy is a procedure that is used to help diagnose and sometimes treat conditions that affect the lower urinary tract.  Cystoscopy is done using   a thin, tube-shaped instrument with a light and camera at the end.  After the procedure, it is common to have some soreness or pain in your abdomen and urethra.  Watch for any blood in your urine. If the amount of blood in your urine increases, call your health care provider.  If you were prescribed an antibiotic medicine, take it as told by your health care provider. Do not stop taking the antibiotic even if you start to feel better. This  information is not intended to replace advice given to you by your health care provider. Make sure you discuss any questions you have with your health care provider. Document Revised: 02/25/2018 Document Reviewed: 02/25/2018 Elsevier Patient Education  2020 [employer]   

## 2019-10-29 LAB — URINALYSIS, COMPLETE
Bilirubin, UA: NEGATIVE
Glucose, UA: NEGATIVE
Ketones, UA: NEGATIVE
Leukocytes,UA: NEGATIVE
Nitrite, UA: NEGATIVE
Protein,UA: NEGATIVE
Specific Gravity, UA: 1.01 (ref 1.005–1.030)
Urobilinogen, Ur: 0.2 mg/dL (ref 0.2–1.0)
pH, UA: 6 (ref 5.0–7.5)

## 2019-10-29 LAB — MICROSCOPIC EXAMINATION
Bacteria, UA: NONE SEEN
WBC, UA: NONE SEEN /hpf (ref 0–5)

## 2019-11-13 ENCOUNTER — Other Ambulatory Visit: Payer: Self-pay

## 2019-11-13 ENCOUNTER — Ambulatory Visit
Admission: RE | Admit: 2019-11-13 | Discharge: 2019-11-13 | Disposition: A | Discharge status: Home / Self Care | Payer: Medicare Other | Source: Ambulatory Visit | Attending: Urology | Admitting: Urology

## 2019-11-13 DIAGNOSIS — R319 Hematuria, unspecified: Secondary | ICD-10-CM

## 2019-11-13 MED ORDER — IOHEXOL 300 MG/ML  SOLN
125.0000 mL | Freq: Once | INTRAMUSCULAR | Status: AC | PRN
  Administered 2019-11-13: 125 mL via INTRAVENOUS

## 2019-11-18 ENCOUNTER — Other Ambulatory Visit: Admitting: Urology

## 2019-12-15 ENCOUNTER — Other Ambulatory Visit: Payer: Self-pay

## 2019-12-15 ENCOUNTER — Ambulatory Visit
Admission: RE | Admit: 2019-12-15 | Discharge: 2019-12-15 | Disposition: A | Discharge status: Home / Self Care | Payer: Medicare Other | Source: Ambulatory Visit | Attending: Student in an Organized Health Care Education/Training Program | Admitting: Student in an Organized Health Care Education/Training Program

## 2019-12-15 ENCOUNTER — Encounter: Payer: Self-pay | Admitting: Student in an Organized Health Care Education/Training Program

## 2019-12-15 ENCOUNTER — Ambulatory Visit (HOSPITAL_BASED_OUTPATIENT_CLINIC_OR_DEPARTMENT_OTHER): Payer: Medicare Other | Admitting: Student in an Organized Health Care Education/Training Program

## 2019-12-15 VITALS — BP 128/93 | HR 76 | Temp 98.1°F | Resp 18 | Ht 63.0 in | Wt 180.0 lb

## 2019-12-15 DIAGNOSIS — G8929 Other chronic pain: Secondary | ICD-10-CM

## 2019-12-15 DIAGNOSIS — M47812 Spondylosis without myelopathy or radiculopathy, cervical region: Secondary | ICD-10-CM | POA: Insufficient documentation

## 2019-12-15 DIAGNOSIS — M12811 Other specific arthropathies, not elsewhere classified, right shoulder: Secondary | ICD-10-CM

## 2019-12-15 DIAGNOSIS — G894 Chronic pain syndrome: Secondary | ICD-10-CM | POA: Insufficient documentation

## 2019-12-15 DIAGNOSIS — M5136 Other intervertebral disc degeneration, lumbar region: Secondary | ICD-10-CM | POA: Insufficient documentation

## 2019-12-15 DIAGNOSIS — M75101 Unspecified rotator cuff tear or rupture of right shoulder, not specified as traumatic: Secondary | ICD-10-CM | POA: Insufficient documentation

## 2019-12-15 DIAGNOSIS — M25511 Pain in right shoulder: Secondary | ICD-10-CM

## 2019-12-15 DIAGNOSIS — M47816 Spondylosis without myelopathy or radiculopathy, lumbar region: Secondary | ICD-10-CM | POA: Insufficient documentation

## 2019-12-15 MED ORDER — BUPRENORPHINE 10 MCG/HR TD PTWK: 1.0000 | MEDICATED_PATCH | TRANSDERMAL | 2 refills | Status: DC

## 2019-12-15 MED ORDER — OXYCODONE-ACETAMINOPHEN 5-325 MG PO TABS: 1.0000 | ORAL_TABLET | Freq: Every day | ORAL | 0 refills | Status: DC | PRN

## 2019-12-15 NOTE — Patient Instructions (Signed)

## 2019-12-15 NOTE — Progress Notes (Signed)
Safety precautions to be maintained throughout the outpatient stay will include: orient to surroundings, keep bed in low position, maintain call bell within reach at all times, provide assistance with transfer out of bed and ambulation.  

## 2019-12-15 NOTE — Progress Notes (Signed)
12/16/2019  CC:  Chief Complaint  Patient presents with  . Cysto    HPI: Leslie Carrillo is a 59 y.o. female who returns for a cystoscopy.   The patient was seen in the ED on 10/17/2019 for right sided flank pain. Pain was ongoing for 2-3 weeks. Stated pain originally began up high near her ribs but since had moved down to her right flank. Patient does have kidney disease and stated it was not unusual for her to have persistent microscopic hematuria dating back to 04/2018. Denied fever, chills, abdominal pain, nausea, vomiting or dysuria. She was administered low-dose IV fentanyl and oral Valium.   UA showed large blood, 6-10 RBCs, mucus present.   CT renal stone study revealed no renal or collecting system calculi. No other acute intra-abdominal or pelvic pathology to explain the patient's symptoms.  She reported having peachy color hematuria. She had more hematuria in the AM. She reported that she had been having pain. She was unsure if she had passed a stone but noted having some nausea and vomiting. She had noted issues of hematuria back in 09/2019 but she followed up with her PCP.   She reported some itching related to yeast infection that has been persistent for 3 months.   CT hematuria work up on 11/13/2019 revealed no CT findings to explain hematuria. No evidence of urinary tract calculus, filling defect, or hydronephrosis. Status post cholecystectomy.  She reported a lot of family related stress. She was currently taking care of her mother ever week.  Her husband was currently undergoing cardiac cath.  She was taking care of her grandchildren.  No prior history of kidney stones.   She has a 25+ pack smoking history. She smokes 3/4 of a pack per day. When stressed she smokes 1 ppd.    Blood pressure 115/76, pulse 73. NED. A&Ox3.   No respiratory distress   Abd soft, NT, ND Normal external genitalia with patent urethral meatus  Cystoscopy Procedure Note  Patient  identification was confirmed, informed consent was obtained, and patient was prepped using Betadine solution.  Lidocaine jelly was administered per urethral meatus.    Procedure: - Flexible cystoscope introduced, without any difficulty.   - Thorough search of the bladder revealed:    normal urethral meatus    normal urothelium    no stones    no ulcers     no tumors    no urethral polyps    no trabeculation  - Ureteral orifices were normal in position and appearance.  Post-Procedure: - Patient tolerated the procedure well  Pertinent image: CLINICAL DATA:  Gross hematuria for 1 year, intermittent bilateral flank pain, history of kidney stones  EXAM: CT ABDOMEN AND PELVIS WITHOUT AND WITH CONTRAST  TECHNIQUE: Multidetector CT imaging of the abdomen and pelvis was performed following the standard protocol before and following the bolus administration of intravenous contrast.  CONTRAST:  OMNIPAQUE IOHEXOL 300 MG/ML  SOLN  COMPARISON:  10/17/2019  FINDINGS: Lower chest: No acute abnormality.  Hepatobiliary: No focal liver abnormality is seen. Status post cholecystectomy. No biliary dilatation.  Pancreas: Unremarkable. No pancreatic ductal dilatation or surrounding inflammatory changes.  Spleen: Normal in size without significant abnormality.  Adrenals/Urinary Tract: Adrenal glands are unremarkable. Kidneys are normal, without renal calculi, solid lesion, or hydronephrosis. Bladder is unremarkable.  Stomach/Bowel: Stomach is within normal limits. Appendix appears normal. No evidence of bowel wall thickening, distention, or inflammatory changes.  Vascular/Lymphatic: Aortic atherosclerosis. No enlarged abdominal or pelvic  lymph nodes.  Reproductive: No mass or other significant abnormality.  Other: No abdominal wall hernia or abnormality. No abdominopelvic ascites.  Musculoskeletal: No acute or significant osseous findings.  IMPRESSION: 1.  No CT findings to explain hematuria. No evidence of urinary tract calculus, filling defect, or hydronephrosis. 2. Status post cholecystectomy. 3. Aortic Atherosclerosis (ICD10-I70.0).   Electronically Signed   By: Lauralyn Primes M.D.   On: 11/13/2019 16:43  I have personally reviewed the images and agree with radiologist interpretation.    Assessment/ Plan:  1. Microscopic hematuria  Patient falls in the high risk category given her age and extensive smoking history.  Cystoscopy is unremarkable.   I recommend repeating cystoscopy in 2 years if microscopic hematuria is persistent.  If she develops gross hematuria, refer back sooner.  Recommendations communicated with PCP.  Leslie Carrillo, am acting as a scribe for Dr. Vanna Scotland.  I have reviewed the above documentation for accuracy and completeness, and I agree with the above.   Vanna Scotland, MD

## 2019-12-15 NOTE — Progress Notes (Signed)
PROVIDER NOTE: Information contained herein reflects review and annotations entered in association with encounter. Interpretation of such information and data should be left to medically-trained personnel. Information provided to patient can be located elsewhere in the medical record under "Patient Instructions". Document created using STT-dictation technology, any transcriptional errors that may result from process are unintentional.    Patient: Leslie Carrillo  Service Category: E/M  Provider: Gillis Santa, MD  DOB: 1960-04-28  DOS: 12/15/2019  Specialty: Interventional Pain Management  MRN: [id number]  Setting: Ambulatory outpatient  PCP: Leslie Hire, MD  Type: Established Patient    Referring Provider: Baxter Hire, MD  Location: Office  Delivery: Face-to-face     HPI  Reason for encounter: [name], a 59 y.o. year old female, is here today for evaluation and management of her Chronic right shoulder pain [M25.511, G89.29]. [name] primary complain today is Back Pain (upper and lower) and Shoulder Pain (right) Last encounter: Practice (10/20/2019). My last encounter with her was on 10/20/2019. Pertinent problems: Leslie Carrillo has Chronic low back pain; DDD (degenerative disc disease), thoracic; Chronic radicular lumbar pain; Cervical facet joint syndrome; Bilateral primary osteoarthritis of knee; Lumbar degenerative disc disease; Chronic pain syndrome; Myofascial pain syndrome; and Lumbar spondylosis on their pertinent problem list. Pain Assessment: Severity of Chronic pain is reported as a 8 /10. Location: Back Upper/radiates from back to shoulder and through chest. Onset: More than a month ago. Quality: Discomfort, Dull. Timing: Constant. Modifying factor(s): oxycodone. Vitals:  height is $RemoveB'5\' 3"'ICbAXSvS$  (1.6 m) and weight is 180 lb (81.6 kg). Her temperature is 98.1 F (36.7 C). Her blood pressure is 128/93 (abnormal) and her pulse is 76. Her respiration is 18 and oxygen saturation is 99%.    Patient follows up today for medication management.  No significant change in her history since her last visit.  She is finding better analgesic benefit after dose increase of her Butrans patch from 7.5 to 10 mcg an hour.  She also utilizes Percocet for breakthrough pain.  She states that when she has severe 10 out of 10 pain, taking one 5 mg oxycodone does help reduce her pain and allows her to function.  She is endorsing right shoulder pain that is worse with abduction.  She has pain raising her arm up.  No inciting or traumatic event.  Will obtain x-ray of right shoulder and discussed intra-articular glenohumeral joint injection for shoulder arthropathy and rotator cuff dysfunction.  Risks and benefits reviewed and patient would like to proceed.  Pharmacotherapy Assessment   Analgesic: Buprenorphine transdermal patch, 10 mcg an hour, oxycodone 5 mg daily as needed for breakthrough pain, quantity #30 to last for 90 days MME/day: 10 mg/day   Monitoring: [place] PMP: PDMP not reviewed this encounter.       Pharmacotherapy: No side-effects or adverse reactions reported. Compliance: No problems identified. Effectiveness: Clinically acceptable.  Dewayne Shorter, RN  12/15/2019 10:55 AM  Signed Safety precautions to be maintained throughout the outpatient stay will include: orient to surroundings, keep bed in low position, maintain call bell within reach at all times, provide assistance with transfer out of bed and ambulation.    UDS:  Summary  Date Value Ref Range Status  03/31/2019 Note  Final    Comment:    ==================================================================== Compliance Drug Analysis, Ur ==================================================================== Test  Result       Flag       Units Drug Present and Declared for Prescription Verification   Tramadol                       >7042        EXPECTED   ng/mg creat   O-Desmethyltramadol            >7042         EXPECTED   ng/mg creat   N-Desmethyltramadol            2638         EXPECTED   ng/mg creat    Source of tramadol is a prescription medication. O-desmethyltramadol    and N-desmethyltramadol are expected metabolites of tramadol.   Sertraline                     PRESENT      EXPECTED   Desmethylsertraline            PRESENT      EXPECTED    Desmethylsertraline is an expected metabolite of sertraline.   Acetaminophen                  PRESENT      EXPECTED Drug Absent but Declared for Prescription Verification   Methocarbamol                  Not Detected UNEXPECTED ==================================================================== Test                      Result    Flag   Units      Ref Range   Creatinine              71               mg/dL      >=20 ==================================================================== Declared Medications:  The flagging and interpretation on this report are based on the  following declared medications.  Unexpected results may arise from  inaccuracies in the declared medications.  **Note: The testing scope of this panel includes these medications:  Methocarbamol  Sertraline  Tramadol  **Note: The testing scope of this panel does not include small to  moderate amounts of these reported medications:  Acetaminophen ==================================================================== For clinical consultation, please call [phone]. ====================================================================      ROS  Constitutional: Denies any fever or chills Gastrointestinal: No reported hemesis, hematochezia, vomiting, or acute GI distress Musculoskeletal: Right shoulder pain Neurological: No reported episodes of acute onset apraxia, aphasia, dysarthria, agnosia, amnesia, paralysis, loss of coordination, or loss of consciousness  Medication Review  buprenorphine, hydrOXYzine, methocarbamol, oxyCODONE-acetaminophen, sertraline, and triamcinolone  cream  History Review  Allergy: Leslie Carrillo is allergic to lyrica [pregabalin], other, hydrocodone-acetaminophen, tramadol, codeine, lovastatin, and simvastatin. Drug: Leslie Carrillo  reports no history of drug use. Alcohol:  reports no history of alcohol use. Tobacco:  reports that she has been smoking. She has a 10.00 pack-year smoking history. She has never used smokeless tobacco. Social: Leslie Carrillo  reports that she has been smoking. She has a 10.00 pack-year smoking history. She has never used smokeless tobacco. She reports that she does not drink alcohol and does not use drugs. Medical:  has a past medical history of Anemia, Arthritis, Asthma, Chronic kidney disease, and Chronic pain. Surgical: Leslie Carrillo  has a past surgical history that includes Cervical fusion (2014). Family: family history includes Dementia  in her mother; Prostate cancer in her father; Renal Cyst in her father.  Laboratory Chemistry Profile   Renal Lab Results  Component Value Date   BUN 11 10/16/2019   CREATININE 0.89 10/16/2019   GFRAA >60 10/16/2019   GFRNONAA >60 10/16/2019     Hepatic Lab Results  Component Value Date   AST 20 10/16/2019   ALT 15 10/16/2019   ALBUMIN 4.0 10/16/2019   ALKPHOS 75 10/16/2019   LIPASE 29 10/16/2019     Electrolytes Lab Results  Component Value Date   NA 137 10/16/2019   K 3.3 (L) 10/16/2019   CL 104 10/16/2019   CALCIUM 8.8 (L) 10/16/2019     Bone No results found for: VD25OH, VD125OH2TOT, OH6073XT0, GY6948NI6, 25OHVITD1, 25OHVITD2, 25OHVITD3, TESTOFREE, TESTOSTERONE   Inflammation (CRP: Acute Phase) (ESR: Chronic Phase) No results found for: CRP, ESRSEDRATE, LATICACIDVEN     Note: Above Lab results reviewed.  Recent Imaging Review  CT HEMATURIA WORKUP CLINICAL DATA:  Gross hematuria for 1 year, intermittent bilateral flank pain, history of kidney stones  EXAM: CT ABDOMEN AND PELVIS WITHOUT AND WITH CONTRAST  TECHNIQUE: Multidetector CT imaging of the  abdomen and pelvis was performed following the standard protocol before and following the bolus administration of intravenous contrast.  CONTRAST:  146mL OMNIPAQUE IOHEXOL 300 MG/ML  SOLN  COMPARISON:  10/17/2019  FINDINGS: Lower chest: No acute abnormality.  Hepatobiliary: No focal liver abnormality is seen. Status post cholecystectomy. No biliary dilatation.  Pancreas: Unremarkable. No pancreatic ductal dilatation or surrounding inflammatory changes.  Spleen: Normal in size without significant abnormality.  Adrenals/Urinary Tract: Adrenal glands are unremarkable. Kidneys are normal, without renal calculi, solid lesion, or hydronephrosis. Bladder is unremarkable.  Stomach/Bowel: Stomach is within normal limits. Appendix appears normal. No evidence of bowel wall thickening, distention, or inflammatory changes.  Vascular/Lymphatic: Aortic atherosclerosis. No enlarged abdominal or pelvic lymph nodes.  Reproductive: No mass or other significant abnormality.  Other: No abdominal wall hernia or abnormality. No abdominopelvic ascites.  Musculoskeletal: No acute or significant osseous findings.  IMPRESSION: 1. No CT findings to explain hematuria. No evidence of urinary tract calculus, filling defect, or hydronephrosis. 2. Status post cholecystectomy. 3. Aortic Atherosclerosis (ICD10-I70.0).  Electronically Signed   By: Eddie Candle M.D.   On: 11/13/2019 16:43 Note: Reviewed        Physical Exam  General appearance: Well nourished, well developed, and well hydrated. In no apparent acute distress Mental status: Alert, oriented x 3 (person, place, & time)       Respiratory: No evidence of acute respiratory distress Eyes: PERLA Vitals: BP (!) 128/93   Pulse 76   Temp 98.1 F (36.7 C)   Resp 18   Ht $R'5\' 3"'NM$  (1.6 m)   Wt 180 lb (81.6 kg)   SpO2 99%   BMI 31.89 kg/m  BMI: Estimated body mass index is 31.89 kg/m as calculated from the following:   Height as of this  encounter: $RemoveBef'5\' 3"'MUlSIksbTi$  (1.6 m).   Weight as of this encounter: 180 lb (81.6 kg). Ideal: Ideal body weight: 52.4 kg (115 lb 8.3 oz) Adjusted ideal body weight: 64.1 kg (141 lb 5 oz)   Upper Extremity (UE) Exam    Side: Right upper extremity  Side: Left upper extremity  Skin & Extremity Inspection: Skin color, temperature, and hair growth are WNL. No peripheral edema or cyanosis. No masses, redness, swelling, asymmetry, or associated skin lesions. No contractures.  Skin & Extremity Inspection: Skin color, temperature, and hair growth  are WNL. No peripheral edema or cyanosis. No masses, redness, swelling, asymmetry, or associated skin lesions. No contractures.  Functional ROM:  Pain restricted range of motion        Functional ROM: Unrestricted ROM          Muscle Tone/Strength: Functionally intact. No obvious neuro-muscular anomalies detected.  Muscle Tone/Strength: Functionally intact. No obvious neuro-muscular anomalies detected.  Sensory (Neurological):  Arthropathic arthralgia of right glenohumeral joint        Sensory (Neurological): Unimpaired          Palpation: No palpable anomalies              Palpation: No palpable anomalies              Provocative Test(s):  Phalen's test: deferred Tinel's test: deferred Apley's scratch test (touch opposite shoulder):  Action 1 (Across chest):  Decreased Action 2 (Overhead):  Decreased Action 3 (LB reach):  Decreased   Provocative Test(s):  Phalen's test: deferred Tinel's test: deferred Apley's scratch test (touch opposite shoulder):  Action 1 (Across chest): deferred Action 2 (Overhead): deferred Action 3 (LB reach): deferred    Lumbar Spine Area Exam  Skin & Axial Inspection: No masses, redness, or swelling Alignment: Symmetrical Functional ROM: Decreased ROM       Stability: No instability detected Muscle Tone/Strength: Functionally intact. No obvious neuro-muscular anomalies detected. Sensory (Neurological): Musculoskeletal  pain pattern Palpation: No palpable anomalies       Provocative Tests: Hyperextension/rotation test: (+) bilaterally for facet joint pain. Lumbar quadrant test (Kemp's test): (+) bilaterally for facet joint pain. Lateral bending test: deferred today       Patrick's Maneuver: deferred today                   FABER* test: deferred today                   S-I anterior distraction/compression test: deferred today         S-I lateral compression test: deferred today         S-I Thigh-thrust test: deferred today         S-I Gaenslen's test: deferred today         *(Flexion, ABduction and External Rotation) Gait & Posture Assessment  Ambulation: Unassisted Gait: Relatively normal for age and body habitus Posture: WNL  Lower Extremity Exam    Side: Right lower extremity  Side: Left lower extremity  Stability: No instability observed          Stability: No instability observed          Skin & Extremity Inspection: Skin color, temperature, and hair growth are WNL. No peripheral edema or cyanosis. No masses, redness, swelling, asymmetry, or associated skin lesions. No contractures.  Skin & Extremity Inspection: Skin color, temperature, and hair growth are WNL. No peripheral edema or cyanosis. No masses, redness, swelling, asymmetry, or associated skin lesions. No contractures.  Functional ROM: Unrestricted ROM                  Functional ROM: Unrestricted ROM                  Muscle Tone/Strength: Functionally intact. No obvious neuro-muscular anomalies detected.  Muscle Tone/Strength: Functionally intact. No obvious neuro-muscular anomalies detected.  Sensory (Neurological): Arthropathic arthralgia        Sensory (Neurological): Arthropathic arthralgia        DTR: Patellar: deferred today Achilles: deferred today Plantar: deferred today  DTR: Patellar: deferred today Achilles: deferred today Plantar: deferred today  Palpation: No palpable anomalies  Palpation: No palpable anomalies      Assessment   Status Diagnosis  Controlled Controlled Controlled 1. Chronic right shoulder pain   2. Right rotator cuff tear arthropathy   3. Lumbar facet arthropathy   4. Lumbar spondylosis   5. Cervical facet joint syndrome   6. Lumbar degenerative disc disease   7. Chronic pain syndrome      Updated Problems: Problem  Lumbar Spondylosis  Chronic Right Shoulder Pain  Right Rotator Cuff Tear Arthropathy    Plan of Care  Leslie Carrillo has a current medication list which includes the following long-term medication(s): sertraline.  Pharmacotherapy (Medications Ordered): Meds ordered this encounter  Medications  . buprenorphine (BUTRANS) 10 MCG/HR PTWK    Sig: Place 1 patch onto the skin every 7 (seven) days.    Dispense:  4 patch    Refill:  2    For chronic pain syndrome  . oxyCODONE-acetaminophen (PERCOCET/ROXICET) 5-325 MG tablet    Sig: Take 1 tablet by mouth daily as needed for severe pain. For chronic pain syndrome    Dispense:  30 tablet    Refill:  0   Orders:  Orders Placed This Encounter  Procedures  . SHOULDER INJECTION    Standing Status:   Future    Standing Expiration Date:   03/15/2020    Scheduling Instructions:     Side: Right with PO Valium     Timeframe: As soon as schedule allows    Order Specific Question:   Where will this procedure be performed?    Answer:   [facility]    Comments:   Marceil Welp  . DG Shoulder Right    Standing Status:   Future    Standing Expiration Date:   06/13/2020    Order Specific Question:   Reason for Exam (SYMPTOM  OR DIAGNOSIS REQUIRED)    Answer:   Right shoulder pain    Order Specific Question:   Preferred imaging location?    Answer:   [facility]    Order Specific Question:   Call Results- Best Contact Number?    Answer:   [phone]    Order Specific Question:   Release to patient    Answer:   Immediate   Follow-up plan:   Return in about 1 week (around 12/22/2019) for R shoulder  steroid injection (anterior, PO valium).     Has tried cervical and thoracic epidural steroid injections with physiatry at [facility].  Consider thoracic TPI.  Consider lumbar facet medial branch nerve blocks for severe facet arthropathy lower lumbar spine present on x-ray.  Could also consider knee intra-articular steroid, Hyalgan, genicular nerve block        Recent Visits Date Type Provider Dept  10/20/19 Office Visit Gillis Santa, MD [facility]  Showing recent visits within past 90 days and meeting all other requirements Today's Visits Date Type Provider Dept  12/15/19 Office Visit Gillis Santa, MD [facility]  Showing today's visits and meeting all other requirements Future Appointments Date Type Provider Dept  12/23/19 Appointment Gillis Santa, MD [facility]  Showing future appointments within next 90 days and meeting all other requirements  I discussed the assessment and treatment plan with the patient. The patient was provided an opportunity to ask questions and all were answered. The patient agreed with the plan and demonstrated an understanding of the instructions.  Patient advised to call back or seek an in-person evaluation if the symptoms or condition worsens.  Duration of encounter:30 minutes.  Note by: Gillis Santa, MD Date: 12/15/2019; Time: 11:12 AM

## 2019-12-16 ENCOUNTER — Ambulatory Visit (INDEPENDENT_AMBULATORY_CARE_PROVIDER_SITE_OTHER): Payer: Medicare Other | Admitting: Urology

## 2019-12-16 VITALS — BP 115/76 | HR 73

## 2019-12-16 DIAGNOSIS — R319 Hematuria, unspecified: Secondary | ICD-10-CM

## 2019-12-17 LAB — URINALYSIS, COMPLETE
Bilirubin, UA: NEGATIVE
Glucose, UA: NEGATIVE
Ketones, UA: NEGATIVE
Leukocytes,UA: NEGATIVE
Nitrite, UA: NEGATIVE
Protein,UA: NEGATIVE
Specific Gravity, UA: 1.025 (ref 1.005–1.030)
Urobilinogen, Ur: 0.2 mg/dL (ref 0.2–1.0)
pH, UA: 5 (ref 5.0–7.5)

## 2019-12-17 LAB — MICROSCOPIC EXAMINATION

## 2019-12-23 ENCOUNTER — Encounter: Payer: Self-pay | Admitting: Student in an Organized Health Care Education/Training Program

## 2019-12-23 ENCOUNTER — Ambulatory Visit
Admission: RE | Admit: 2019-12-23 | Discharge: 2019-12-23 | Disposition: A | Discharge status: Home / Self Care | Payer: Medicare Other | Source: Ambulatory Visit | Attending: Student in an Organized Health Care Education/Training Program | Admitting: Student in an Organized Health Care Education/Training Program

## 2019-12-23 ENCOUNTER — Ambulatory Visit (HOSPITAL_BASED_OUTPATIENT_CLINIC_OR_DEPARTMENT_OTHER): Payer: Medicare Other | Admitting: Student in an Organized Health Care Education/Training Program

## 2019-12-23 ENCOUNTER — Other Ambulatory Visit: Payer: Self-pay

## 2019-12-23 VITALS — BP 107/62 | HR 59 | Temp 97.5°F | Resp 16 | Ht 63.0 in | Wt 180.0 lb

## 2019-12-23 DIAGNOSIS — G8929 Other chronic pain: Secondary | ICD-10-CM | POA: Insufficient documentation

## 2019-12-23 DIAGNOSIS — M75101 Unspecified rotator cuff tear or rupture of right shoulder, not specified as traumatic: Secondary | ICD-10-CM | POA: Diagnosis present

## 2019-12-23 DIAGNOSIS — M12811 Other specific arthropathies, not elsewhere classified, right shoulder: Secondary | ICD-10-CM

## 2019-12-23 DIAGNOSIS — G894 Chronic pain syndrome: Secondary | ICD-10-CM

## 2019-12-23 DIAGNOSIS — M25511 Pain in right shoulder: Secondary | ICD-10-CM | POA: Insufficient documentation

## 2019-12-23 MED ORDER — ROPIVACAINE HCL 2 MG/ML IJ SOLN
4.0000 mL | Freq: Once | INTRAMUSCULAR | Status: AC
  Administered 2019-12-23: 4 mL via INTRA_ARTICULAR
  Filled 2019-12-23: qty 10

## 2019-12-23 MED ORDER — LIDOCAINE HCL 2 % IJ SOLN
20.0000 mL | Freq: Once | INTRAMUSCULAR | Status: AC
  Administered 2019-12-23: 200 mg
  Filled 2019-12-23: qty 20

## 2019-12-23 MED ORDER — DIAZEPAM 5 MG PO TABS
5.0000 mg | ORAL_TABLET | Freq: Once | ORAL | Status: AC
  Administered 2019-12-23: 5 mg via ORAL
  Filled 2019-12-23: qty 1

## 2019-12-23 MED ORDER — METHYLPREDNISOLONE ACETATE 40 MG/ML IJ SUSP
40.0000 mg | Freq: Once | INTRAMUSCULAR | Status: AC
  Administered 2019-12-23: 40 mg via INTRA_ARTICULAR
  Filled 2019-12-23: qty 1

## 2019-12-23 MED ORDER — IOHEXOL 180 MG/ML  SOLN
10.0000 mL | Freq: Once | INTRAMUSCULAR | Status: AC
  Administered 2019-12-23: 10 mL via INTRA_ARTICULAR
  Filled 2019-12-23: qty 20

## 2019-12-23 NOTE — Progress Notes (Signed)
PROVIDER NOTE: Information contained herein reflects review and annotations entered in association with encounter. Interpretation of such information and data should be left to medically-trained personnel. Information provided to patient can be located elsewhere in the medical record under "Patient Instructions". Document created using STT-dictation technology, any transcriptional errors that may result from process are unintentional.    Patient: Leslie Carrillo  Service Category: Procedure  Provider: Edward Jolly, MD  DOB: [birth year]  DOS: 12/23/2019  Location: [facility]  MRN: [id number]  Setting: Ambulatory - outpatient  Referring Provider: Gracelyn Nurse, MD  Type: Established Patient  Specialty: Interventional Pain Management  PCP: Gracelyn Nurse, MD   Primary Reason for Visit: Interventional Pain Management Treatment. CC: Shoulder Pain (right)  Procedure:          Anesthesia, Analgesia, Anxiolysis:  Type: Diagnostic Glenohumeral Joint (shoulder) Injection #1  Primary Purpose: Diagnostic Region: Anterior Shoulder Area Level:  Shoulder Target Area: Glenohumeral Joint (shoulder) Approach: Anterior approach. Laterality: Right-Sided  Type: Local Anesthesia with PO Valium  Local Anesthetic: Lidocaine 1-2%  Position: Supine   Indications: 1. Chronic right shoulder pain   2. Right rotator cuff tear arthropathy   3. Chronic pain syndrome    Pain Score: Pre-procedure: 8 /10 Post-procedure: 6 /10   Shoulder xray- RIGHT CLINICAL DATA:  Chronic right shoulder pain.  EXAM: RIGHT SHOULDER - 2+ VIEW  COMPARISON:  None.  FINDINGS: There is no acute bony or joint abnormality. Mild to moderate acromioclavicular and mild glenohumeral osteoarthritis noted. The patient has a remote fracture of a right rib, approximately the fifth third rib. Soft tissues unremarkable.  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  Pre-op  Assessment:  Leslie Carrillo is a 59 y.o. (year old), female patient, seen today for interventional treatment. She  has a past surgical history that includes Cervical fusion (2014). Leslie Carrillo has a current medication list which includes the following prescription(s): buprenorphine, hydroxyzine, methocarbamol, oxycodone-acetaminophen, sertraline, and triamcinolone cream. Her primarily concern today is the Shoulder Pain (right)  Initial Vital Signs:  Pulse/HCG Rate: (!) 59ECG Heart Rate: (!) 56 Temp: (!) 97.5 F (36.4 C) Resp: 18 BP: 120/76 SpO2: 100 %  BMI: Estimated body mass index is 31.89 kg/m as calculated from the following:   Height as of this encounter: 5\' 3"  (1.6 m).   Weight as of this encounter: 180 lb (81.6 kg).  Risk Assessment: Allergies: Reviewed. She is allergic to lyrica [pregabalin], other, hydrocodone-acetaminophen, tramadol, codeine, lovastatin, and simvastatin.  Allergy Precautions: None required Coagulopathies: Reviewed. None identified.  Blood-thinner therapy: None at this time Active Infection(s): Reviewed. None identified. Leslie Carrillo is afebrile  Site Confirmation: Leslie Carrillo was asked to confirm the procedure and laterality before marking the site Procedure checklist: Completed Consent: Before the procedure and under the influence of no sedative(s), amnesic(s), or anxiolytics, the patient was informed of the treatment options, risks and possible complications. To fulfill our ethical and legal obligations, as recommended by the American Medical Association's Code of Ethics, I have informed the patient of my clinical impression; the nature and purpose of the treatment or procedure; the risks, benefits, and possible complications of the intervention; the alternatives, including doing nothing; the risk(s) and benefit(s) of the alternative treatment(s) or procedure(s); and the risk(s) and benefit(s) of doing nothing. The patient was provided information about the general risks  and possible complications associated with the procedure. These may include, but are not limited to: failure to achieve desired goals, infection, bleeding,  organ or nerve damage, allergic reactions, paralysis, and death. In addition, the patient was informed of those risks and complications associated to the procedure, such as failure to decrease pain; infection; bleeding; organ or nerve damage with subsequent damage to sensory, motor, and/or autonomic systems, resulting in permanent pain, numbness, and/or weakness of one or several areas of the body; allergic reactions; (i.e.: anaphylactic reaction); and/or death. Furthermore, the patient was informed of those risks and complications associated with the medications. These include, but are not limited to: allergic reactions (i.e.: anaphylactic or anaphylactoid reaction(s)); adrenal axis suppression; blood sugar elevation that in diabetics may result in ketoacidosis or comma; water retention that in patients with history of congestive heart failure may result in shortness of breath, pulmonary edema, and decompensation with resultant heart failure; weight gain; swelling or edema; medication-induced neural toxicity; particulate matter embolism and blood vessel occlusion with resultant organ, and/or nervous system infarction; and/or aseptic necrosis of one or more joints. Finally, the patient was informed that Medicine is not an exact science; therefore, there is also the possibility of unforeseen or unpredictable risks and/or possible complications that may result in a catastrophic outcome. The patient indicated having understood very clearly. We have given the patient no guarantees and we have made no promises. Enough time was given to the patient to ask questions, all of which were answered to the patient's satisfaction. Leslie Carrillo has indicated that she wanted to continue with the procedure. Attestation: I, the ordering provider, attest that I have discussed with  the patient the benefits, risks, side-effects, alternatives, likelihood of achieving goals, and potential problems during recovery for the procedure that I have provided informed consent. Date  Time: 12/23/2019 11:05 AM  Pre-Procedure Preparation:  Monitoring: As per clinic protocol. Respiration, ETCO2, SpO2, BP, heart rate and rhythm monitor placed and checked for adequate function Safety Precautions: Patient was assessed for positional comfort and pressure points before starting the procedure. Time-out: I initiated and conducted the "Time-out" before starting the procedure, as per protocol. The patient was asked to participate by confirming the accuracy of the "Time Out" information. Verification of the correct person, site, and procedure were performed and confirmed by me, the nursing staff, and the patient. "Time-out" conducted as per Joint Commission's Universal Protocol (UP.01.01.01). Time: 1155  Description of Procedure:          Area Prepped: Entire shoulder Area DuraPrep (Iodine Povacrylex [0.7% available iodine] and Isopropyl Alcohol, 74% w/w) Safety Precautions: Aspiration looking for blood return was conducted prior to all injections. At no point did we inject any substances, as a needle was being advanced. No attempts were made at seeking any paresthesias. Safe injection practices and needle disposal techniques used. Medications properly checked for expiration dates. SDV (single dose vial) medications used. Description of the Procedure: Protocol guidelines were followed. The patient was placed in position over the procedure table. The target area was identified and the area prepped in the usual manner. Skin & deeper tissues infiltrated with local anesthetic. Appropriate amount of time allowed to pass for local anesthetics to take effect. The procedure needles were then advanced to the target area. Proper needle placement secured. Negative aspiration confirmed. Solution injected in  intermittent fashion, asking for systemic symptoms every 0.5cc of injectate. The needles were then removed and the area cleansed, making sure to leave some of the prepping solution back to take advantage of its long term bactericidal properties.         Vitals:   12/23/19 1147 12/23/19  1152 12/23/19 1158 12/23/19 1200  BP: 129/73 119/72 (!) 103/56 107/62  Pulse:      Resp: 16 13 10 16   Temp:      TempSrc:      SpO2: 97% 97% 95% 95%  Weight:      Height:        Start Time: 1155 hrs. End Time: 1200 hrs. Materials:  Needle(s) Type: Spinal Needle Gauge: 22G Length: 3.5-in Medication(s): Please see orders for medications and dosing details. 5 cc solution made of 4 cc of 0.2% cocaine, 1 cc of methylprednisolone, 40 mg/cc. Imaging Guidance (Non-Spinal):          Type of Imaging Technique: Fluoroscopy Guidance (Non-Spinal) Indication(s): Assistance in needle guidance and placement for procedures requiring needle placement in or near specific anatomical locations not easily accessible without such assistance. Exposure Time: Please see nurses notes. Contrast: Before injecting any contrast, we confirmed that the patient did not have an allergy to iodine, shellfish, or radiological contrast. Once satisfactory needle placement was completed at the desired level, radiological contrast was injected. Contrast injected under live fluoroscopy. No contrast complications. See chart for type and volume of contrast used. Fluoroscopic Guidance: I was personally present during the use of fluoroscopy. "Tunnel Vision Technique" used to obtain the best possible view of the target area. Parallax error corrected before commencing the procedure. "Direction-depth-direction" technique used to introduce the needle under continuous pulsed fluoroscopy. Once target was reached, antero-posterior, oblique, and lateral fluoroscopic projection used confirm needle placement in all planes. Images permanently stored in  EMR. Interpretation: I personally interpreted the imaging intraoperatively. Adequate needle placement confirmed in multiple planes. Appropriate spread of contrast into desired area was observed. No evidence of afferent or efferent intravascular uptake. Permanent images saved into the patient's record.  Antibiotic Prophylaxis:   Anti-infectives (From admission, onward)   None     Indication(s): None identified  Post-operative Assessment:  Post-procedure Vital Signs:  Pulse/HCG Rate: (!) 59(!) 52 Temp: (!) 97.5 F (36.4 C) Resp: 16 BP: 107/62 SpO2: 95 %  EBL: None  Complications: No immediate post-treatment complications observed by team, or reported by patient.  Note: The patient tolerated the entire procedure well. A repeat set of vitals were taken after the procedure and the patient was kept under observation following institutional policy, for this type of procedure. Post-procedural neurological assessment was performed, showing return to baseline, prior to discharge. The patient was provided with post-procedure discharge instructions, including a section on how to identify potential problems. Should any problems arise concerning this procedure, the patient was given instructions to immediately contact , at any time, without hesitation. In any case, we plan to contact the patient by telephone for a follow-up status report regarding this interventional procedure.  Comments:  No additional relevant information.  Plan of Care  Orders:  Orders Placed This Encounter  Procedures  . DG PAIN CLINIC C-ARM 1-60 MIN NO REPORT    Intraoperative interpretation by procedural physician at [facility].    Standing Status:   Standing    Number of Occurrences:   1    Order Specific Question:   Reason for exam:    Answer:   Assistance in needle guidance and placement for procedures requiring needle placement in or near specific anatomical locations not easily accessible without such  assistance.   Chronic Opioid Analgesic:  Buprenorphine transdermal patch, 10 mcg an hour, oxycodone 5 mg daily as needed for breakthrough pain, quantity #30 to last for 90 days MME/day:  10 mg/day   Medications ordered for procedure: Meds ordered this encounter  Medications  . ropivacaine (PF) 2 mg/mL (0.2%) (NAROPIN) injection 4 mL  . methylPREDNISolone acetate (DEPO-MEDROL) injection 40 mg  . iohexol (OMNIPAQUE) 180 MG/ML injection 10 mL    Must be Myelogram-compatible. If not available, you may substitute with a water-soluble, non-ionic, hypoallergenic, myelogram-compatible radiological contrast medium.  Marland Kitchen lidocaine (XYLOCAINE) 2 % (with pres) injection 400 mg  . diazepam (VALIUM) tablet 5 mg   Medications administered: We administered ropivacaine (PF) 2 mg/mL (0.2%), methylPREDNISolone acetate, iohexol, lidocaine, and diazepam.  See the medical record for exact dosing, route, and time of administration.  Follow-up plan:   Return in about 3 weeks (around 01/13/2020) for Post Procedure Evaluation, virtual.      Has tried cervical and thoracic epidural steroid injections with physiatry at [facility].  Consider thoracic TPI.  Consider lumbar facet medial branch nerve blocks for severe facet arthropathy lower lumbar spine present on x-ray.  Could also consider knee intra-articular steroid, Hyalgan, genicular nerve block. S/p right shoulder injection 12/23/19     Recent Visits Date Type Provider Dept  12/15/19 Office Visit Edward Jolly, MD [facility]  10/20/19 Office Visit Edward Jolly, MD [facility]  Showing recent visits within past 90 days and meeting all other requirements Today's Visits Date Type Provider Dept  12/23/19 Procedure visit Edward Jolly, MD [facility]  Showing today's visits and meeting all other requirements Future Appointments Date Type Provider Dept  01/13/20 Appointment Edward Jolly, MD [facility]  03/03/20 Appointment Edward Jolly, MD [facility]  Showing future appointments within next 90 days and meeting all other requirements  Disposition: Discharge home  Discharge (Date  Time): 12/23/2019; 1209 hrs.   Primary Care Physician: Gracelyn Nurse, MD Location: [facility] Note by: Edward Jolly, MD Date: 12/23/2019; Time: 1:50 PM  Disclaimer:  Medicine is not an exact science. The only guarantee in medicine is that nothing is guaranteed. It is important to note that the decision to proceed with this intervention was based on the information collected from the patient. The Data and conclusions were drawn from the patient's questionnaire, the interview, and the physical examination. Because the information was provided in large part by the patient, it cannot be guaranteed that it has not been purposely or unconsciously manipulated. Every effort has been made to obtain as much relevant data as possible for this evaluation. It is important to note that the conclusions that lead to this procedure are derived in large part from the available data. Always take into account that the treatment will also be dependent on availability of resources and existing treatment guidelines, considered by other Pain Management Practitioners as being common knowledge and practice, at the time of the intervention. For Medico-Legal purposes, it is also important to point out that variation in procedural techniques and pharmacological choices are the acceptable norm. The indications, contraindications, technique, and results of the above procedure should only be interpreted and judged by a Board-Certified Interventional Pain Specialist with extensive familiarity and expertise in the same exact procedure and technique.

## 2019-12-24 ENCOUNTER — Telehealth: Payer: Self-pay | Admitting: *Deleted

## 2019-12-24 NOTE — Telephone Encounter (Signed)
Voicemail left with patient re; procedure on yesterday to please call our office if there are questions or concerns otherwise we will see her at the next appt.

## 2020-01-12 ENCOUNTER — Encounter: Payer: Self-pay | Admitting: Student in an Organized Health Care Education/Training Program

## 2020-01-13 ENCOUNTER — Other Ambulatory Visit: Payer: Self-pay

## 2020-01-13 ENCOUNTER — Encounter: Payer: Self-pay | Admitting: Student in an Organized Health Care Education/Training Program

## 2020-01-13 ENCOUNTER — Ambulatory Visit
Payer: Medicare Other | Attending: Student in an Organized Health Care Education/Training Program | Admitting: Student in an Organized Health Care Education/Training Program

## 2020-01-13 DIAGNOSIS — G8929 Other chronic pain: Secondary | ICD-10-CM

## 2020-01-13 DIAGNOSIS — M75101 Unspecified rotator cuff tear or rupture of right shoulder, not specified as traumatic: Secondary | ICD-10-CM | POA: Diagnosis not present

## 2020-01-13 DIAGNOSIS — M25511 Pain in right shoulder: Secondary | ICD-10-CM | POA: Diagnosis not present

## 2020-01-13 DIAGNOSIS — M12811 Other specific arthropathies, not elsewhere classified, right shoulder: Secondary | ICD-10-CM

## 2020-01-13 DIAGNOSIS — G894 Chronic pain syndrome: Secondary | ICD-10-CM

## 2020-01-13 NOTE — Progress Notes (Signed)
Patient: Leslie Carrillo  Service Category: E/M  Provider: Gillis Santa, MD  DOB: May 15, 1960  DOS: 01/13/2020  Location: Office  MRN: [id number]  Setting: Ambulatory outpatient  Referring Provider: Baxter Hire, MD  Type: Established Patient  Specialty: Interventional Pain Management  PCP: Baxter Hire, MD  Location: Home  Delivery: TeleHealth     Virtual Encounter - Pain Management PROVIDER NOTE: Information contained herein reflects review and annotations entered in association with encounter. Interpretation of such information and data should be left to medically-trained personnel. Information provided to patient can be located elsewhere in the medical record under "Patient Instructions". Document created using STT-dictation technology, any transcriptional errors that may result from process are unintentional.    Contact & Pharmacy Preferred: [phone] Home: [phone] (home) Mobile: [phone] (mobile) E-mail: [email or web address]  [id number] [address] [place] [address] Phone: [phone] Fax: [phone]   Pre-screening  Ms. Basso offered "in-person" vs "virtual" encounter. She indicated preferring virtual for this encounter.   Reason COVID-19*  Social distancing based on CDC and AMA recommendations.   I contacted Ellwood Handler on 01/13/2020 via video conference.      I clearly identified myself as Gillis Santa, MD. I verified that I was speaking with the correct person using two identifiers (Name: GRACIOUS RENKEN, and date of birth: May 06, 1960).  Consent I sought verbal advanced consent from Ellwood Handler for virtual visit interactions. I informed Ms. Baxendale of possible security and privacy concerns, risks, and limitations associated with providing "not-in-person" medical evaluation and management services. I also informed Ms. Neukam of the availability of "in-person"  appointments. Finally, I informed her that there would be a charge for the virtual visit and that she could be  personally, fully or partially, financially responsible for it. Ms. Salak expressed understanding and agreed to proceed.   Historic Elements   Ms. KYELLE URBAS is a 59 y.o. year old, female patient evaluated today after our last contact on 12/23/2019. Ms. Whistler  has a past medical history of Anemia, Arthritis, Asthma, Chronic kidney disease, and Chronic pain. She also  has a past surgical history that includes Cervical fusion (2014). Ms. Zellars has a current medication list which includes the following prescription(s): buprenorphine, hydroxyzine, methocarbamol, oxycodone-acetaminophen, sertraline, and triamcinolone cream. She  reports that she has been smoking. She has a 10.00 pack-year smoking history. She has never used smokeless tobacco. She reports that she does not drink alcohol and does not use drugs. Ms. Renier is allergic to lyrica [pregabalin], other, hydrocodone-acetaminophen, tramadol, codeine, lovastatin, and simvastatin.   HPI  Today, she is being contacted for a post-procedure assessment.   Post-Procedure Evaluation  Procedure (12/23/2019):  Type: Diagnostic Glenohumeral Joint (shoulder) Injection #1  Primary Purpose: Diagnostic Region: Anterior Shoulder Area Level:  Shoulder Target Area: Glenohumeral Joint (shoulder) Approach: Anterior approach. Laterality: Right-Sided  Sedation: Please see nurses note.  Effectiveness during initial hour after procedure(Ultra-Short Term Relief): 100 %  Local anesthetic used: Long-acting (4-6 hours) Effectiveness: Defined as any analgesic benefit obtained secondary to the administration of local anesthetics. This carries significant diagnostic value as to the etiological location, or anatomical origin, of the pain. Duration of benefit is expected to coincide with the duration of the local anesthetic used.  Effectiveness during initial  4-6 hours after procedure(Short-Term Relief): 100 %   Long-term benefit: Defined as any relief past the pharmacologic duration  of the local anesthetics.  Effectiveness past the initial 6 hours after procedure(Long-Term Relief): 90 % (lasted a week and a half)   Current benefits: Defined as benefit that persist at this time.   Analgesia:  <50% better Function: Back to baseline ROM: Back to baseline    Laboratory Chemistry Profile   Renal Lab Results  Component Value Date   BUN 11 10/16/2019   CREATININE 0.89 10/16/2019   GFRAA >60 10/16/2019   GFRNONAA >60 10/16/2019     Hepatic Lab Results  Component Value Date   AST 20 10/16/2019   ALT 15 10/16/2019   ALBUMIN 4.0 10/16/2019   ALKPHOS 75 10/16/2019   LIPASE 29 10/16/2019     Electrolytes Lab Results  Component Value Date   NA 137 10/16/2019   K 3.3 (L) 10/16/2019   CL 104 10/16/2019   CALCIUM 8.8 (L) 10/16/2019     Bone No results found for: VD25OH, VD125OH2TOT, DZ3299ME2, AS3419QQ2, 25OHVITD1, 25OHVITD2, 25OHVITD3, TESTOFREE, TESTOSTERONE   Inflammation (CRP: Acute Phase) (ESR: Chronic Phase) No results found for: CRP, ESRSEDRATE, LATICACIDVEN     Note: Above Lab results reviewed.   Assessment  The primary encounter diagnosis was Chronic right shoulder pain. Diagnoses of Right rotator cuff tear arthropathy and Chronic pain syndrome were also pertinent to this visit.  Plan of Care  Ms. WILBUR LABUDA has a current medication list which includes the following long-term medication(s): sertraline.   Postprocedural evaluation status post right anterior glenohumeral joint steroid injection.  Patient states that she experienced almost 100% pain relief for the first week and a half along with improvement in range of motion and functional status.  She states that she was elated with the pain relief that she experienced but after approximately 2 weeks, she had return of her shoulder pain with limitation in range of  motion.  We discussed next steps and patient would like to try repeating shoulder steroid injection and we will add more volume to see if that translates to longer duration of pain relief.  Risk and benefits reviewed and patient would like to proceed.  Orders:  Orders Placed This Encounter  Procedures  . SHOULDER INJECTION    Standing Status:   Future    Standing Expiration Date:   04/14/2020    Scheduling Instructions:     Side: RIGHT     Sedation: without     Timeframe: As soon as schedule allows    Order Specific Question:   Where will this procedure be performed?    Answer:   [facility]    Comments:   [place]   Follow-up plan:   Return in about 2 weeks (around 01/27/2020) for R shoulder steroid injection #2 (anterior), without sedation.     Has tried cervical and thoracic epidural steroid injections with physiatry at [facility].  Consider thoracic TPI.  Consider lumbar facet medial branch nerve blocks for severe facet arthropathy lower lumbar spine present on x-ray.  Could also consider knee intra-articular steroid, Hyalgan, genicular nerve block. S/p right shoulder injection 12/23/19: Provided pain relief, 100% for almost 2 weeks with gradual return of pain.      Recent Visits Date Type Provider Dept  12/23/19 Procedure visit Gillis Santa, MD [facility]  12/15/19 Office Visit Gillis Santa, MD [facility]  10/20/19 Office Visit Gillis Santa, MD [facility]  Showing recent visits within past 90 days and meeting all other requirements Today's Visits Date Type Provider Dept  01/13/20 Telemedicine  Gillis Santa, MD [facility]  Showing today's visits and meeting all other requirements Future Appointments Date Type Provider Dept  03/03/20 Appointment Gillis Santa, MD [facility]  Showing future appointments within next 90 days and meeting all other requirements  I discussed the assessment and treatment plan with the patient. The  patient was provided an opportunity to ask questions and all were answered. The patient agreed with the plan and demonstrated an understanding of the instructions.  Patient advised to call back or seek an in-person evaluation if the symptoms or condition worsens.  Duration of encounter: 20 minutes.  Note by: Gillis Santa, MD Date: 01/13/2020; Time: 3:17 PM

## 2020-02-03 ENCOUNTER — Encounter: Payer: Self-pay | Admitting: Student in an Organized Health Care Education/Training Program

## 2020-02-03 ENCOUNTER — Ambulatory Visit
Admission: RE | Admit: 2020-02-03 | Discharge: 2020-02-03 | Disposition: A | Discharge status: Home / Self Care | Payer: Medicare Other | Source: Ambulatory Visit | Attending: Student in an Organized Health Care Education/Training Program | Admitting: Student in an Organized Health Care Education/Training Program

## 2020-02-03 ENCOUNTER — Ambulatory Visit (HOSPITAL_BASED_OUTPATIENT_CLINIC_OR_DEPARTMENT_OTHER): Payer: Medicare Other | Admitting: Student in an Organized Health Care Education/Training Program

## 2020-02-03 ENCOUNTER — Other Ambulatory Visit: Payer: Self-pay

## 2020-02-03 VITALS — BP 105/88 | Temp 97.3°F | Resp 10 | Ht 63.0 in | Wt 180.0 lb

## 2020-02-03 DIAGNOSIS — M75101 Unspecified rotator cuff tear or rupture of right shoulder, not specified as traumatic: Secondary | ICD-10-CM

## 2020-02-03 DIAGNOSIS — G8929 Other chronic pain: Secondary | ICD-10-CM | POA: Diagnosis present

## 2020-02-03 DIAGNOSIS — M25511 Pain in right shoulder: Secondary | ICD-10-CM

## 2020-02-03 DIAGNOSIS — G894 Chronic pain syndrome: Secondary | ICD-10-CM | POA: Diagnosis not present

## 2020-02-03 DIAGNOSIS — M12811 Other specific arthropathies, not elsewhere classified, right shoulder: Secondary | ICD-10-CM

## 2020-02-03 MED ORDER — ROPIVACAINE HCL 2 MG/ML IJ SOLN
4.0000 mL | Freq: Once | INTRAMUSCULAR | Status: AC
  Administered 2020-02-03: 10 mL via INTRA_ARTICULAR
  Filled 2020-02-03: qty 10

## 2020-02-03 MED ORDER — IOHEXOL 180 MG/ML  SOLN
10.0000 mL | Freq: Once | INTRAMUSCULAR | Status: AC
  Administered 2020-02-03: 10 mL via INTRA_ARTICULAR
  Filled 2020-02-03: qty 20

## 2020-02-03 MED ORDER — LIDOCAINE HCL 2 % IJ SOLN
20.0000 mL | Freq: Once | INTRAMUSCULAR | Status: AC
  Administered 2020-02-03: 400 mg
  Filled 2020-02-03: qty 20

## 2020-02-03 MED ORDER — METHYLPREDNISOLONE ACETATE 40 MG/ML IJ SUSP
40.0000 mg | Freq: Once | INTRAMUSCULAR | Status: AC
  Administered 2020-02-03: 40 mg via INTRA_ARTICULAR
  Filled 2020-02-03: qty 1

## 2020-02-03 NOTE — Progress Notes (Signed)
PROVIDER NOTE: Information contained herein reflects review and annotations entered in association with encounter. Interpretation of such information and data should be left to medically-trained personnel. Information provided to patient can be located elsewhere in the medical record under "Patient Instructions". Document created using STT-dictation technology, any transcriptional errors that may result from process are unintentional.    Patient: Leslie Carrillo  Service Category: Procedure  Provider: Edward Jolly, MD  DOB: 1960/03/20  DOS: 02/03/2020  Location: [facility]  MRN: [id number]  Setting: Ambulatory - outpatient  Referring Provider: Gracelyn Nurse, MD  Type: Established Patient  Specialty: Interventional Pain Management  PCP: Gracelyn Nurse, MD   Primary Reason for Visit: Interventional Pain Management Treatment. CC: Shoulder Pain (right)  Procedure:          Anesthesia, Analgesia, Anxiolysis:  Type: Diagnostic Glenohumeral Joint (shoulder) Injection #2  Primary Purpose: Diagnostic Region: Anterior Shoulder Area Level:  Shoulder Target Area: Glenohumeral Joint (shoulder) Approach: Anterior approach. Laterality: Right-Sided  Type: Local Anesthesia   Local Anesthetic: Lidocaine 1-2%  Position: Supine   Indications: 1. Chronic right shoulder pain   2. Right rotator cuff tear arthropathy   3. Chronic pain syndrome    Pain Score: Pre-procedure: 10-Worst pain ever/10 Post-procedure: 0-No pain/10   Shoulder xray- RIGHT CLINICAL DATA:  Chronic right shoulder pain.  EXAM: RIGHT SHOULDER - 2+ VIEW  COMPARISON:  None.  FINDINGS: There is no acute bony or joint abnormality. Mild to moderate acromioclavicular and mild glenohumeral osteoarthritis noted. The patient has a remote fracture of a right rib, approximately the fifth third rib. Soft tissues unremarkable.  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  Pre-op Assessment:  Leslie Carrillo is a 59 y.o. (year old), female patient, seen today for interventional treatment. She  has a past surgical history that includes Cervical fusion (2014). Leslie Carrillo has a current medication list which includes the following prescription(s): buprenorphine, hydroxyzine, methocarbamol, oxycodone-acetaminophen, sertraline, and triamcinolone cream. Her primarily concern today is the Shoulder Pain (right)  Initial Vital Signs:  Pulse/HCG Rate:  ECG Heart Rate: 67 Temp: (!) 97.3 F (36.3 C) Resp: 18 BP: (!) 89/64 (retaken left 103/69) SpO2: 99 %  BMI: Estimated body mass index is 31.89 kg/m as calculated from the following:   Height as of this encounter: 5\' 3"  (1.6 m).   Weight as of this encounter: 180 lb (81.6 kg).  Risk Assessment: Allergies: Reviewed. She is allergic to lyrica [pregabalin], other, hydrocodone-acetaminophen, tramadol, codeine, lovastatin, and simvastatin.  Allergy Precautions: None required Coagulopathies: Reviewed. None identified.  Blood-thinner therapy: None at this time Active Infection(s): Reviewed. None identified. Leslie Carrillo is afebrile  Site Confirmation: Leslie Carrillo was asked to confirm the procedure and laterality before marking the site Procedure checklist: Completed Consent: Before the procedure and under the influence of no sedative(s), amnesic(s), or anxiolytics, the patient was informed of the treatment options, risks and possible complications. To fulfill our ethical and legal obligations, as recommended by the American Medical Association's Code of Ethics, I have informed the patient of my clinical impression; the nature and purpose of the treatment or procedure; the risks, benefits, and possible complications of the intervention; the alternatives, including doing nothing; the risk(s) and benefit(s) of the alternative treatment(s) or procedure(s); and the risk(s) and benefit(s) of doing nothing. The  patient was provided information about the general risks and possible complications associated with the procedure. These may include, but are not limited to: failure to achieve desired goals,  infection, bleeding, organ or nerve damage, allergic reactions, paralysis, and death. In addition, the patient was informed of those risks and complications associated to the procedure, such as failure to decrease pain; infection; bleeding; organ or nerve damage with subsequent damage to sensory, motor, and/or autonomic systems, resulting in permanent pain, numbness, and/or weakness of one or several areas of the body; allergic reactions; (i.e.: anaphylactic reaction); and/or death. Furthermore, the patient was informed of those risks and complications associated with the medications. These include, but are not limited to: allergic reactions (i.e.: anaphylactic or anaphylactoid reaction(s)); adrenal axis suppression; blood sugar elevation that in diabetics may result in ketoacidosis or comma; water retention that in patients with history of congestive heart failure may result in shortness of breath, pulmonary edema, and decompensation with resultant heart failure; weight gain; swelling or edema; medication-induced neural toxicity; particulate matter embolism and blood vessel occlusion with resultant organ, and/or nervous system infarction; and/or aseptic necrosis of one or more joints. Finally, the patient was informed that Medicine is not an exact science; therefore, there is also the possibility of unforeseen or unpredictable risks and/or possible complications that may result in a catastrophic outcome. The patient indicated having understood very clearly. We have given the patient no guarantees and we have made no promises. Enough time was given to the patient to ask questions, all of which were answered to the patient's satisfaction. Leslie Carrillo has indicated that she wanted to continue with the procedure. Attestation: I,  the ordering provider, attest that I have discussed with the patient the benefits, risks, side-effects, alternatives, likelihood of achieving goals, and potential problems during recovery for the procedure that I have provided informed consent. Date  Time: 02/03/2020 10:58 AM  Pre-Procedure Preparation:  Monitoring: As per clinic protocol. Respiration, ETCO2, SpO2, BP, heart rate and rhythm monitor placed and checked for adequate function Safety Precautions: Patient was assessed for positional comfort and pressure points before starting the procedure. Time-out: I initiated and conducted the "Time-out" before starting the procedure, as per protocol. The patient was asked to participate by confirming the accuracy of the "Time Out" information. Verification of the correct person, site, and procedure were performed and confirmed by me, the nursing staff, and the patient. "Time-out" conducted as per Joint Commission's Universal Protocol (UP.01.01.01). Time: 1128  Description of Procedure:          Area Prepped: Entire shoulder Area DuraPrep (Iodine Povacrylex [0.7% available iodine] and Isopropyl Alcohol, 74% w/w) Safety Precautions: Aspiration looking for blood return was conducted prior to all injections. At no point did we inject any substances, as a needle was being advanced. No attempts were made at seeking any paresthesias. Safe injection practices and needle disposal techniques used. Medications properly checked for expiration dates. SDV (single dose vial) medications used. Description of the Procedure: Protocol guidelines were followed. The patient was placed in position over the procedure table. The target area was identified and the area prepped in the usual manner. Skin & deeper tissues infiltrated with local anesthetic. Appropriate amount of time allowed to pass for local anesthetics to take effect. The procedure needles were then advanced to the target area. Proper needle placement secured.  Negative aspiration confirmed. Solution injected in intermittent fashion, asking for systemic symptoms every 0.5cc of injectate. The needles were then removed and the area cleansed, making sure to leave some of the prepping solution back to take advantage of its long term bactericidal properties.         Vitals:   02/03/20  1104 02/03/20 1128 02/03/20 1132  BP: (!) 89/64 125/66 105/88  Resp: 18 14 10   Temp: (!) 97.3 F (36.3 C)    TempSrc: Temporal    SpO2: 99% 95% 94%  Weight: 180 lb (81.6 kg)    Height: 5\' 3"  (1.6 m)      Start Time: 1128 hrs. End Time: 1132 hrs. Materials:  Needle(s) Type: Spinal Needle Gauge: 22G Length: 3.5-in Medication(s): Please see orders for medications and dosing details. 5 cc solution made of 4 cc of 0.2% ropivacaine, 1 cc of methylprednisolone, 40 mg/cc. Imaging Guidance (Non-Spinal):          Type of Imaging Technique: Fluoroscopy Guidance (Non-Spinal) Indication(s): Assistance in needle guidance and placement for procedures requiring needle placement in or near specific anatomical locations not easily accessible without such assistance. Exposure Time: Please see nurses notes. Contrast: Before injecting any contrast, we confirmed that the patient did not have an allergy to iodine, shellfish, or radiological contrast. Once satisfactory needle placement was completed at the desired level, radiological contrast was injected. Contrast injected under live fluoroscopy. No contrast complications. See chart for type and volume of contrast used. Fluoroscopic Guidance: I was personally present during the use of fluoroscopy. "Tunnel Vision Technique" used to obtain the best possible view of the target area. Parallax error corrected before commencing the procedure. "Direction-depth-direction" technique used to introduce the needle under continuous pulsed fluoroscopy. Once target was reached, antero-posterior, oblique, and lateral fluoroscopic projection used confirm  needle placement in all planes. Images permanently stored in EMR. Interpretation: I personally interpreted the imaging intraoperatively. Adequate needle placement confirmed in multiple planes. Appropriate spread of contrast into desired area was observed. No evidence of afferent or efferent intravascular uptake. Permanent images saved into the patient's record.  Antibiotic Prophylaxis:   Anti-infectives (From admission, onward)   None     Indication(s): None identified  Post-operative Assessment:  Post-procedure Vital Signs:  Pulse/HCG Rate:  60 Temp: (!) 97.3 F (36.3 C) Resp: 10 BP: 105/88 SpO2: 94 %  EBL: None  Complications: No immediate post-treatment complications observed by team, or reported by patient.  Note: The patient tolerated the entire procedure well. A repeat set of vitals were taken after the procedure and the patient was kept under observation following institutional policy, for this type of procedure. Post-procedural neurological assessment was performed, showing return to baseline, prior to discharge. The patient was provided with post-procedure discharge instructions, including a section on how to identify potential problems. Should any problems arise concerning this procedure, the patient was given instructions to immediately contact , at any time, without hesitation. In any case, we plan to contact the patient by telephone for a follow-up status report regarding this interventional procedure.  Comments:  No additional relevant information.  Plan of Care  Orders:  Orders Placed This Encounter  Procedures  . DG PAIN CLINIC C-ARM 1-60 MIN NO REPORT    Intraoperative interpretation by procedural physician at [facility].    Standing Status:   Standing    Number of Occurrences:   1    Order Specific Question:   Reason for exam:    Answer:   Assistance in needle guidance and placement for procedures requiring needle placement in or near specific  anatomical locations not easily accessible without such assistance.  . MR SHOULDER RIGHT WO CONTRAST    Standing Status:   Future    Standing Expiration Date:   08/02/2020    Order Specific Question:   What is  the patient's sedation requirement?    Answer:   No Sedation    Order Specific Question:   Does the patient have a pacemaker or implanted devices?    Answer:   No    Order Specific Question:   Preferred imaging location?    Answer:   [facility] (table limit - [phone])    Order Specific Question:   Radiology Contrast Protocol - do NOT remove file path    Answer:   [email or web address]   Chronic Opioid Analgesic:  Buprenorphine transdermal patch, 10 mcg an hour, oxycodone 5 mg daily as needed for breakthrough pain, quantity #30 to last for 90 days MME/day: 10 mg/day   Given worsening right shoulder pain that is impacting patient's functional status and sleep, will order right shoulder MRI to evaluate for any rotator cuff tear that could be contributing to her severe pain and limited range of motion.  Medications ordered for procedure: Meds ordered this encounter  Medications  . iohexol (OMNIPAQUE) 180 MG/ML injection 10 mL    Must be Myelogram-compatible. If not available, you may substitute with a water-soluble, non-ionic, hypoallergenic, myelogram-compatible radiological contrast medium.  Marland Kitchen lidocaine (XYLOCAINE) 2 % (with pres) injection 400 mg  . ropivacaine (PF) 2 mg/mL (0.2%) (NAROPIN) injection 4 mL  . methylPREDNISolone acetate (DEPO-MEDROL) injection 40 mg   Medications administered: We administered iohexol, lidocaine, ropivacaine (PF) 2 mg/mL (0.2%), and methylPREDNISolone acetate.  See the medical record for exact dosing, route, and time of administration.  Follow-up plan:   Return for Keep sch. appt.      Has tried cervical and thoracic epidural steroid injections with physiatry at [facility].  Consider thoracic TPI.  Consider lumbar facet medial  branch nerve blocks for severe facet arthropathy lower lumbar spine present on x-ray.  Could also consider knee intra-articular steroid, Hyalgan, genicular nerve block. S/p right shoulder injection 12/23/19, 02/03/20 right shoulder MRI next, consider referral to orthopedics Dr Allena Katz    Recent Visits Date Type Provider Dept  01/13/20 Telemedicine Edward Jolly, MD [facility]  12/23/19 Procedure visit Edward Jolly, MD [facility]  12/15/19 Office Visit Edward Jolly, MD [facility]  Showing recent visits within past 90 days and meeting all other requirements Today's Visits Date Type Provider Dept  02/03/20 Procedure visit Edward Jolly, MD [facility]  Showing today's visits and meeting all other requirements Future Appointments Date Type Provider Dept  03/03/20 Appointment Edward Jolly, MD [facility]  Showing future appointments within next 90 days and meeting all other requirements  Disposition: Discharge home  Discharge (Date  Time): 02/03/2020;   hrs.   Primary Care Physician: Gracelyn Nurse, MD Location: [facility] Note by: Edward Jolly, MD Date: 02/03/2020; Time: 11:45 AM  Disclaimer:  Medicine is not an exact science. The only guarantee in medicine is that nothing is guaranteed. It is important to note that the decision to proceed with this intervention was based on the information collected from the patient. The Data and conclusions were drawn from the patient's questionnaire, the interview, and the physical examination. Because the information was provided in large part by the patient, it cannot be guaranteed that it has not been purposely or unconsciously manipulated. Every effort has been made to obtain as much relevant data as possible for this evaluation. It is important to note that the conclusions that lead to this procedure are derived in large part from the available data. Always take into  account that the  treatment will also be dependent on availability of resources and existing treatment guidelines, considered by other Pain Management Practitioners as being common knowledge and practice, at the time of the intervention. For Medico-Legal purposes, it is also important to point out that variation in procedural techniques and pharmacological choices are the acceptable norm. The indications, contraindications, technique, and results of the above procedure should only be interpreted and judged by a Board-Certified Interventional Pain Specialist with extensive familiarity and expertise in the same exact procedure and technique.

## 2020-02-03 NOTE — Progress Notes (Signed)
Safety precautions to be maintained throughout the outpatient stay will include: orient to surroundings, keep bed in low position, maintain call bell within reach at all times, provide assistance with transfer out of bed and ambulation.  

## 2020-02-04 ENCOUNTER — Telehealth: Payer: Self-pay | Admitting: *Deleted

## 2020-02-04 NOTE — Telephone Encounter (Signed)
Voicemail left with patient re; procedure on yesterday. If any questions or concerns please call our office, otherwise will see at her f/up appt.

## 2020-02-24 ENCOUNTER — Other Ambulatory Visit: Payer: Self-pay

## 2020-02-24 ENCOUNTER — Ambulatory Visit
Admission: RE | Admit: 2020-02-24 | Discharge: 2020-02-24 | Disposition: A | Discharge status: Home / Self Care | Payer: Medicare Other | Source: Ambulatory Visit | Attending: Student in an Organized Health Care Education/Training Program | Admitting: Student in an Organized Health Care Education/Training Program

## 2020-02-24 DIAGNOSIS — G8929 Other chronic pain: Secondary | ICD-10-CM | POA: Insufficient documentation

## 2020-02-24 DIAGNOSIS — M75101 Unspecified rotator cuff tear or rupture of right shoulder, not specified as traumatic: Secondary | ICD-10-CM | POA: Diagnosis present

## 2020-02-24 DIAGNOSIS — G894 Chronic pain syndrome: Secondary | ICD-10-CM | POA: Diagnosis present

## 2020-02-24 DIAGNOSIS — M25511 Pain in right shoulder: Secondary | ICD-10-CM | POA: Diagnosis not present

## 2020-02-24 DIAGNOSIS — M12811 Other specific arthropathies, not elsewhere classified, right shoulder: Secondary | ICD-10-CM | POA: Diagnosis present

## 2020-03-03 ENCOUNTER — Encounter: Payer: Self-pay | Admitting: Student in an Organized Health Care Education/Training Program

## 2020-03-03 ENCOUNTER — Ambulatory Visit
Payer: Medicare Other | Attending: Student in an Organized Health Care Education/Training Program | Admitting: Student in an Organized Health Care Education/Training Program

## 2020-03-03 ENCOUNTER — Other Ambulatory Visit: Payer: Self-pay

## 2020-03-03 VITALS — BP 122/74 | HR 73 | Temp 97.2°F | Resp 16 | Ht 63.0 in | Wt 173.0 lb

## 2020-03-03 DIAGNOSIS — G894 Chronic pain syndrome: Secondary | ICD-10-CM

## 2020-03-03 DIAGNOSIS — M7591 Shoulder lesion, unspecified, right shoulder: Secondary | ICD-10-CM | POA: Insufficient documentation

## 2020-03-03 DIAGNOSIS — M75101 Unspecified rotator cuff tear or rupture of right shoulder, not specified as traumatic: Secondary | ICD-10-CM | POA: Diagnosis not present

## 2020-03-03 DIAGNOSIS — M7581 Other shoulder lesions, right shoulder: Secondary | ICD-10-CM | POA: Insufficient documentation

## 2020-03-03 DIAGNOSIS — M12811 Other specific arthropathies, not elsewhere classified, right shoulder: Secondary | ICD-10-CM | POA: Diagnosis present

## 2020-03-03 MED ORDER — BUPRENORPHINE 15 MCG/HR TD PTWK: 1.0000 | MEDICATED_PATCH | TRANSDERMAL | 2 refills | Status: DC

## 2020-03-03 MED ORDER — OXYCODONE-ACETAMINOPHEN 5-325 MG PO TABS: 1.0000 | ORAL_TABLET | ORAL | 0 refills | Status: DC | PRN

## 2020-03-03 MED ORDER — METHOCARBAMOL 500 MG PO TABS: 500.0000 mg | ORAL_TABLET | Freq: Two times a day (BID) | ORAL | 5 refills | Status: DC | PRN

## 2020-03-03 NOTE — Patient Instructions (Addendum)
(  Butrans) to last until  05/26/2020, (Oxycodone/APAP) to last until 04/02/2020 and Robaxin have been escribed to your pharmacy.  You have been referred to Dr. Rosita Kea.

## 2020-03-03 NOTE — Progress Notes (Signed)
PROVIDER NOTE: Information contained herein reflects review and annotations entered in association with encounter. Interpretation of such information and data should be left to medically-trained personnel. Information provided to patient can be located elsewhere in the medical record under "Patient Instructions". Document created using STT-dictation technology, any transcriptional errors that may result from process are unintentional.    Patient: Leslie Carrillo  Service Category: E/M  Provider: Skylyn Slezak, MD  DOB: 03/26/1960  DOS: 03/03/2020  Specialty: Interventional Pain Management  MRN: [id number]  Setting: Ambulatory outpatient  PCP: Johnston, John D, MD  Type: Established Patient    Referring Provider: Johnston, John D, MD  Location: Office  Delivery: Face-to-face     HPI  [name], a 59 y.o. year old female, is here today because of her Right rotator cuff tear arthropathy [M75.101, M12.811]. Leslie Carrillo's primary complain today is Shoulder Pain (right) and Back Pain (upper) Last encounter: My last encounter with her was on 02/03/2020. Pertinent problems: Leslie Carrillo has Chronic low back pain; DDD (degenerative disc disease), thoracic; Chronic radicular lumbar pain; Cervical facet joint syndrome; Bilateral primary osteoarthritis of knee; Lumbar degenerative disc disease; Chronic pain syndrome; Myofascial pain syndrome; and Lumbar spondylosis on their pertinent problem list. Pain Assessment: Severity of Chronic pain is reported as a 5 /10. Location: Shoulder Right/NEW shoulder "popping from time to time yielding "excruciating pain". Onset: More than a month ago. Quality: Aching. Timing: Constant. Modifying factor(s): meds. Vitals:  height is 5' 3" (1.6 m) and weight is 173 lb (78.5 kg). Her temporal temperature is 97.2 F (36.2 C) (abnormal). Her blood pressure is 122/74 and her pulse is 73. Her respiration is 16 and oxygen saturation is 99%.   Reason for encounter: worsening of previously  known (established) problem, post procedure evaluation, medication management   Post-Procedure Evaluation  Procedure (02/03/2020):   Type: Diagnostic Glenohumeral Joint (shoulder) Injection #2  Primary Purpose: Diagnostic Region: Anterior Shoulder Area Level:  Shoulder Target Area: Glenohumeral Joint (shoulder) Approach: Anterior approach. Laterality: Right-Sided  Sedation: Please see nurses note.  Effectiveness during initial hour after procedure(Ultra-Short Term Relief): 60 %   Local anesthetic used: Long-acting (4-6 hours) Effectiveness: Defined as any analgesic benefit obtained secondary to the administration of local anesthetics. This carries significant diagnostic value as to the etiological location, or anatomical origin, of the pain. Duration of benefit is expected to coincide with the duration of the local anesthetic used.  Effectiveness during initial 4-6 hours after procedure(Short-Term Relief): 60 % (X 3 days)   Long-term benefit: Defined as any relief past the pharmacologic duration of the local anesthetics.  Effectiveness past the initial 6 hours after procedure(Long-Term Relief): 60% for 3 days  Current benefits: Defined as benefit that persist at this time.   Analgesia:  No benefit Function: No benefit ROM: No benefit   Pharmacotherapy Assessment   Analgesic: Buprenorphine transdermal patch, 15 mcg an hour, oxycodone 5 mg daily as needed for breakthrough pain, quantity #30 to last for 90 days MME/day: 10 mg/day   Monitoring: [place] PMP: PDMP reviewed during this encounter.       Pharmacotherapy: No side-effects or adverse reactions reported. Compliance: No problems identified. Effectiveness: Clinically acceptable.  Welborn, Susan, RN  03/03/2020 10:44 AM  Sign when Signing Visit Nursing Pain Medication Assessment:  Safety precautions to be maintained throughout the outpatient stay will include: orient to surroundings, keep bed in low position, maintain call bell  within reach at all times, provide assistance with transfer out of bed and   ambulation.  Medication Inspection Compliance: Pill count conducted under aseptic conditions, in front of the patient. Neither the pills nor the bottle was removed from the patient's sight at any time. Once count was completed pills were immediately returned to the patient in their original bottle.  Medication: Oxycodone/APAP Pill/Patch Count: 9 of 30 pills remain Pill/Patch Appearance: Markings consistent with prescribed medication Bottle Appearance: Standard pharmacy container. Clearly labeled. Filled Date: 109 / 28 / 2021 Last Medication intake:  "probably 1 week ago"   Pt reminded to bring Butrans box to each appt. As well. Pt states 1 patch remains in box at home.    UDS:  Summary  Date Value Ref Range Status  03/31/2019 Note  Final    Comment:    ==================================================================== Compliance Drug Analysis, Ur ==================================================================== Test                             Result       Flag       Units Drug Present and Declared for Prescription Verification   Tramadol                       >7042        EXPECTED   ng/mg creat   O-Desmethyltramadol            >7042        EXPECTED   ng/mg creat   N-Desmethyltramadol            2638         EXPECTED   ng/mg creat    Source of tramadol is a prescription medication. O-desmethyltramadol    and N-desmethyltramadol are expected metabolites of tramadol.   Sertraline                     PRESENT      EXPECTED   Desmethylsertraline            PRESENT      EXPECTED    Desmethylsertraline is an expected metabolite of sertraline.   Acetaminophen                  PRESENT      EXPECTED Drug Absent but Declared for Prescription Verification   Methocarbamol                  Not Detected UNEXPECTED ==================================================================== Test                      Result     Flag   Units      Ref Range   Creatinine              71               mg/dL      >=20 ==================================================================== Declared Medications:  The flagging and interpretation on this report are based on the  following declared medications.  Unexpected results may arise from  inaccuracies in the declared medications.  **Note: The testing scope of this panel includes these medications:  Methocarbamol  Sertraline  Tramadol  **Note: The testing scope of this panel does not include small to  moderate amounts of these reported medications:  Acetaminophen ==================================================================== For clinical consultation, please call [phone]. ====================================================================      ROS  Constitutional: Denies any fever or chills Gastrointestinal: No reported hemesis, hematochezia, vomiting, or acute GI distress Musculoskeletal:  Right shoulder pain Neurological: No reported episodes of acute onset apraxia, aphasia, dysarthria, agnosia, amnesia, paralysis, loss of coordination, or loss of consciousness  Medication Review  buprenorphine, hydrOXYzine, methocarbamol, oxyCODONE-acetaminophen, sertraline, and triamcinolone cream  History Review  Allergy: Ms. Mckee is allergic to lyrica [pregabalin], other, hydrocodone-acetaminophen, tramadol, codeine, lovastatin, and simvastatin. Drug: Ms. Adney  reports no history of drug use. Alcohol:  reports no history of alcohol use. Tobacco:  reports that she has been smoking. She has a 10.00 pack-year smoking history. She has never used smokeless tobacco. Social: Ms. Sportsman  reports that she has been smoking. She has a 10.00 pack-year smoking history. She has never used smokeless tobacco. She reports that she does not drink alcohol and does not use drugs. Medical:  has a past medical history of Anemia, Arthritis, Asthma, Chronic kidney disease, and  Chronic pain. Surgical: Ms. Gillott  has a past surgical history that includes Cervical fusion (2014). Family: family history includes Dementia in her mother; Prostate cancer in her father; Renal Cyst in her father.  Laboratory Chemistry Profile   Renal Lab Results  Component Value Date   BUN 11 10/16/2019   CREATININE 0.89 10/16/2019   GFRAA >60 10/16/2019   GFRNONAA >60 10/16/2019     Hepatic Lab Results  Component Value Date   AST 20 10/16/2019   ALT 15 10/16/2019   ALBUMIN 4.0 10/16/2019   ALKPHOS 75 10/16/2019   LIPASE 29 10/16/2019     Electrolytes Lab Results  Component Value Date   NA 137 10/16/2019   K 3.3 (L) 10/16/2019   CL 104 10/16/2019   CALCIUM 8.8 (L) 10/16/2019     Bone No results found for: VD25OH, VD125OH2TOT, HA1937TK2, IO9735HG9, 25OHVITD1, 25OHVITD2, 25OHVITD3, TESTOFREE, TESTOSTERONE   Inflammation (CRP: Acute Phase) (ESR: Chronic Phase) No results found for: CRP, ESRSEDRATE, LATICACIDVEN     Note: Above Lab results reviewed.  Recent Imaging Review  MR SHOULDER RIGHT WO CONTRAST CLINICAL DATA:  Chronic right shoulder pain for the last 2 months, extending down the right arm. Numbness in the arm. Limited range of motion.  EXAM: MRI OF THE RIGHT SHOULDER WITHOUT CONTRAST  TECHNIQUE: Multiplanar, multisequence MR imaging of the shoulder was performed. No intravenous contrast was administered.  COMPARISON:  Radiographs of 12/15/2019  FINDINGS: Rotator cuff: Full-thickness partial width tear of the majority of the supraspinatus tendon, with a 0.5 cm gap between the distal and proximal ends of the tendon shown on image 13 of series 7. I suspect a small posterior portion of the supraspinatus is still attaching to the humeral head, although the tear is nearly complete  The irregular distal segment measures about 1.2 cm in length.  Moderate to prominent supraspinatus and moderate infraspinatus tendinopathy with mild fissuring in the  infraspinatus and subscapularis tendons. There is no fatty atrophy or edema in the supraspinatus muscle.  Muscles: Atrophy and some fatty replacement in the teres minor muscle suggesting prior quadrilateral space syndrome. No current significant degree of muscular edema.  Biceps long head: Nonvisualization of the intra-articular segment of the long head, probably reflecting rupture.  Acromioclavicular Joint: Prominent spurring and mild subcortical marrow edema. Type I acromion. There is fluid in the subacromial subdeltoid bursa along with mild synovitis communicating with the glenohumeral joint. There is also fluid in the subcoracoid bursa.  Glenohumeral Joint: Mild degenerative chondral thinning and mild spurring. Degenerative subcortical cyst formation anteriorly along the humeral head adjacent to the greater and lesser tuberosity and bicipital groove.  Labrum: Truncated superior  labrum, probably torn, for example on image 13 of series 7.  Bones: No significant extra-articular osseous abnormalities identified.  Other: No supplemental non-categorized findings.  IMPRESSION: 1. Full-thickness partial width tear of the majority of the supraspinatus tendon, with a 0.5 cm gap between the distal and proximal ends of the tendon. This tear is nearly full width, with only small posterior portion of the supraspinatus tendon intact. Moderate to prominent supraspinatus and moderate infraspinatus tendinopathy with mild fissuring in the infraspinatus and subscapularis tendons. 2. Nonvisualization of the intra-articular segment of the long head of the biceps, probably reflecting rupture. 3. Truncated superior labrum, favoring a tear. 4. Prominent degenerative spurring of the acromioclavicular joint. Mild degenerative chondral thinning in the glenohumeral joint. 5. Atrophy and fatty replacement in the teres minor muscle suggesting prior quadrilateral space syndrome.  Electronically  Signed   By: Walter  Liebkemann M.D.   On: 02/24/2020 17:01 Note: Reviewed Discussed in great detail with patient  Physical Exam  General appearance: Well nourished, well developed, and well hydrated. In no apparent acute distress Mental status: Alert, oriented x 3 (person, place, & time)       Respiratory: No evidence of acute respiratory distress Eyes: PERLA Vitals: BP 122/74   Pulse 73   Temp (!) 97.2 F (36.2 C) (Temporal)   Resp 16   Ht 5' 3" (1.6 m)   Wt 173 lb (78.5 kg)   SpO2 99%   BMI 30.65 kg/m  BMI: Estimated body mass index is 30.65 kg/m as calculated from the following:   Height as of this encounter: 5' 3" (1.6 m).   Weight as of this encounter: 173 lb (78.5 kg). Ideal: Ideal body weight: 52.4 kg (115 lb 8.3 oz) Adjusted ideal body weight: 62.8 kg (138 lb 8.2 oz)  Upper Extremity (UE) Exam    Side:Right upper extremity  Side:Left upper extremity  Skin & Extremity Inspection:Skin color, temperature, and hair growth are WNL. No peripheral edema or cyanosis. No masses, redness, swelling, asymmetry, or associated skin lesions. No contractures.  Skin & Extremity Inspection:Skin color, temperature, and hair growth are WNL. No peripheral edema or cyanosis. No masses, redness, swelling, asymmetry, or associated skin lesions. No contractures.  Functional ROM: Pain restricted range of motion  Functional ROM:Unrestricted ROM  Muscle Tone/Strength:Functionally intact. No obvious neuro-muscular anomalies detected.  Muscle Tone/Strength:Functionally intact. No obvious neuro-muscular anomalies detected.  Sensory (Neurological): Arthropathic arthralgia of right glenohumeral joint  Sensory (Neurological):Unimpaired  Palpation:No palpable anomalies  Palpation:No palpable anomalies  Provocative Test(s): Phalen's test:deferred Tinel's test:deferred Apley's scratch test (touch opposite shoulder): Action 1  (Across chest): Decreased Action 2 (Overhead): Decreased Action 3 (LB reach): Decreased   Provocative Test(s): Phalen's test:deferred Tinel's test:deferred Apley's scratch test (touch opposite shoulder): Action 1 (Across chest):deferred Action 2 (Overhead):deferred Action 3 (LB reach):deferred      Assessment   Diagnosis  1. Right rotator cuff tear arthropathy   2. Tear of right supraspinatus tendon   3. Supraspinatus tendonitis, right   4. Infraspinatus tendinitis, right   5. Chronic pain syndrome      Updated Problems: Problem  Supraspinatus Tendonitis, Right  Infraspinatus Tendinitis, Right  Tear of Right Supraspinatus Tendon    Plan of Care  Ms. Aribelle R Snedden has a current medication list which includes the following long-term medication(s): sertraline.  1.  Refill of chronic pain medications as below.  We will increase Butrans patch from 10 mcg an hour to 15 mcg an hour given increased pain of her right shoulder.    Continue with oxycodone as needed for breakthrough pain.  Refill Robaxin as below. 2.  Referral to orthopedic surgery for surgical evaluation in the context of right supraspinatus tendon tear with pain that has been refractory to medication management, physical therapy, shoulder steroid injection.  Pharmacotherapy (Medications Ordered): Meds ordered this encounter  Medications  . buprenorphine (BUTRANS) 15 MCG/HR    Sig: Place 1 patch onto the skin every 7 (seven) days.    Dispense:  4 patch    Refill:  2    For chronic pain syndrome  . oxyCODONE-acetaminophen (PERCOCET/ROXICET) 5-325 MG tablet    Sig: Take 1 tablet by mouth as needed for severe pain (breakthrough pain).    Dispense:  30 tablet    Refill:  0  . methocarbamol (ROBAXIN) 500 MG tablet    Sig: Take 1 tablet (500 mg total) by mouth 2 (two) times daily as needed for muscle spasms.    Dispense:  60 tablet    Refill:  5   Orders:  Orders Placed This Encounter  Procedures   . ToxASSURE Select 13 (MW), Urine    Volume: 30 ml(s). Minimum 3 ml of urine is needed. Document temperature of fresh sample. Indications: Long term (current) use of opiate analgesic (Q68.341)    Order Specific Question:   Release to patient    Answer:   Immediate  . AMB referral to orthopedics    Referral Priority:   Routine    Referral Type:   Consultation    Referred to Provider:   Hessie Knows, MD    Number of Visits Requested:   1   Follow-up plan:   Return in 3 months (on 06/01/2020) for Medication Management, in person.     Has tried cervical and thoracic epidural steroid injections with physiatry at [facility].  Consider thoracic TPI.  Consider lumbar facet medial branch nerve blocks for severe facet arthropathy lower lumbar spine present on x-ray.  Could also consider knee intra-articular steroid, Hyalgan, genicular nerve block. S/p right shoulder injection 12/23/19, 02/03/20 right shoulder MRI-supraspinatus tendon tear with infraspinatus tendinopathy, referral to orthopedics for possible surgical intervention     Recent Visits Date Type Provider Dept  02/03/20 Procedure visit Gillis Santa, MD [facility]  01/13/20 Telemedicine Gillis Santa, MD [facility]  12/23/19 Procedure visit Gillis Santa, MD [facility]  12/15/19 Office Visit Gillis Santa, MD [facility]  Showing recent visits within past 90 days and meeting all other requirements Today's Visits Date Type Provider Dept  03/03/20 Office Visit Gillis Santa, MD [facility]  Showing today's visits and meeting all other requirements Future Appointments Date Type Provider Dept  05/26/20 Appointment Gillis Santa, MD [facility]  Showing future appointments within next 90 days and meeting all other requirements  I discussed the assessment and treatment plan with the patient. The patient was provided an opportunity to ask questions and all were answered. The patient  agreed with the plan and demonstrated an understanding of the instructions.  Patient advised to call back or seek an in-person evaluation if the symptoms or condition worsens.  Duration of encounter: 30 minutes.  Note by: Gillis Santa, MD Date: 03/03/2020; Time: 2:11 PM

## 2020-03-03 NOTE — Progress Notes (Signed)
Nursing Pain Medication Assessment:  Safety precautions to be maintained throughout the outpatient stay will include: orient to surroundings, keep bed in low position, maintain call bell within reach at all times, provide assistance with transfer out of bed and ambulation.  Medication Inspection Compliance: Pill count conducted under aseptic conditions, in front of the patient. Neither the pills nor the bottle was removed from the patient's sight at any time. Once count was completed pills were immediately returned to the patient in their original bottle.  Medication: Oxycodone/APAP Pill/Patch Count: 9 of 30 pills remain Pill/Patch Appearance: Markings consistent with prescribed medication Bottle Appearance: Standard pharmacy container. Clearly labeled. Filled Date: 91 / 28 / 2021 Last Medication intake:  "probably 1 week ago"   Pt reminded to bring Butrans box to each appt. As well. Pt states 1 patch remains in box at home.

## 2020-03-08 ENCOUNTER — Telehealth: Payer: Self-pay

## 2020-03-08 MED ORDER — BUPRENORPHINE 15 MCG/HR TD PTWK: 1.0000 | MEDICATED_PATCH | TRANSDERMAL | 2 refills | Status: DC

## 2020-03-08 NOTE — Telephone Encounter (Signed)
Her pharmacy states they didn't get the prescription for her patches. Please add that

## 2020-03-08 NOTE — Telephone Encounter (Signed)
Dr Cherylann Ratel, the pharmacy did not get the script for Butrans patches.  When I look at the order, it does NOT say that it was received from the pharmacy.

## 2020-03-11 LAB — TOXASSURE SELECT 13 (MW), URINE

## 2020-05-26 ENCOUNTER — Ambulatory Visit
Payer: Medicare Other | Attending: Student in an Organized Health Care Education/Training Program | Admitting: Student in an Organized Health Care Education/Training Program

## 2020-05-26 ENCOUNTER — Other Ambulatory Visit: Payer: Self-pay

## 2020-05-26 ENCOUNTER — Encounter: Payer: Self-pay | Admitting: Student in an Organized Health Care Education/Training Program

## 2020-05-26 VITALS — BP 127/79 | HR 70 | Temp 97.3°F | Resp 18 | Ht 63.0 in | Wt 173.0 lb

## 2020-05-26 DIAGNOSIS — M7581 Other shoulder lesions, right shoulder: Secondary | ICD-10-CM | POA: Diagnosis present

## 2020-05-26 DIAGNOSIS — M12811 Other specific arthropathies, not elsewhere classified, right shoulder: Secondary | ICD-10-CM | POA: Diagnosis present

## 2020-05-26 DIAGNOSIS — M75101 Unspecified rotator cuff tear or rupture of right shoulder, not specified as traumatic: Secondary | ICD-10-CM | POA: Diagnosis present

## 2020-05-26 DIAGNOSIS — G894 Chronic pain syndrome: Secondary | ICD-10-CM | POA: Diagnosis present

## 2020-05-26 DIAGNOSIS — M7591 Shoulder lesion, unspecified, right shoulder: Secondary | ICD-10-CM | POA: Insufficient documentation

## 2020-05-26 MED ORDER — METHOCARBAMOL 500 MG PO TABS: 500.0000 mg | ORAL_TABLET | Freq: Two times a day (BID) | ORAL | 5 refills | Status: DC | PRN

## 2020-05-26 MED ORDER — OXYCODONE-ACETAMINOPHEN 5-325 MG PO TABS: 1.0000 | ORAL_TABLET | ORAL | 0 refills | Status: DC | PRN

## 2020-05-26 MED ORDER — BUPRENORPHINE 15 MCG/HR TD PTWK
1.0000 | MEDICATED_PATCH | TRANSDERMAL | 2 refills | Status: DC
Start: 2020-05-26 — End: 2020-08-25

## 2020-05-26 NOTE — Progress Notes (Signed)
PROVIDER NOTE: Information contained herein reflects review and annotations entered in association with encounter. Interpretation of such information and data should be left to medically-trained personnel. Information provided to patient can be located elsewhere in the medical record under "Patient Instructions". Document created using STT-dictation technology, any transcriptional errors that may result from process are unintentional.    Patient: Leslie Carrillo  Service Category: E/M  Provider: Gillis Santa, MD  DOB: Jun 10, 1960  DOS: 05/26/2020  Specialty: Interventional Pain Management  MRN: [id number]  Setting: Ambulatory outpatient  PCP: Baxter Hire, MD  Type: Established Patient    Referring Provider: Baxter Hire, MD  Location: Office  Delivery: Face-to-face     HPI  Leslie Carrillo, a 60 y.o. year old female, is here today because of her Right rotator cuff tear arthropathy [M75.101, M12.811]. Leslie Carrillo primary complain today is Back Pain (low) and Shoulder Pain (right) Last encounter: My last encounter with her was on 03/03/2020. Pertinent problems: Leslie Carrillo has Chronic low back pain; DDD (degenerative disc disease), thoracic; Chronic radicular lumbar pain; Cervical facet joint syndrome; Bilateral primary osteoarthritis of knee; Lumbar degenerative disc disease; Chronic pain syndrome; Myofascial pain syndrome; and Lumbar spondylosis on their pertinent problem list. Pain Assessment: Severity of Chronic pain is reported as a 4 /10. Location: Back Lower/starts at shoulder blades and radiates down to lower back. Onset: More than a month ago. Quality: Dull,Sharp (depends on movement). Timing:  . Modifying factor(s): meds. Vitals:  height is _0  (1.6 m) and weight is 173 lb (78.5 kg). Her temperature is 97.3 F (36.3 C) (abnormal). Her blood pressure is 127/79 and her pulse is 70. Her respiration is 18 and oxygen saturation is 98%.   Reason for encounter: medication management.   Since  the patient's last visit with me, she has been evaluated by orthopedic surgery, Dr. Roland Rack who has recommended right shoulder surgery.  Patient is trying to figure out a time where she can do this that is conducive to her schedule.,  At her last visit with me we increase her Butrans patch from 10 mcg an hour to 15 mcg an hour.  She states that this is doing a better job at helping to manage her pain.  She is utilizing approximately 8-10 Percocets a month for breakthrough pain which is very reasonable until she is able to have surgery.  We will refill as below and patient instructed to follow-up in 3 months.  Pharmacotherapy Assessment   Analgesic: Buprenorphine transdermal patch, 15 mcg an hour, oxycodone 5 mg daily as needed for breakthrough pain, quantity #30 to last for 90 days MME/day: 10 mg/day   Monitoring: [place] PMP: PDMP reviewed during this encounter.       Pharmacotherapy: No side-effects or adverse reactions reported. Compliance: No problems identified. Effectiveness: Clinically acceptable.  Dewayne Shorter, RN  05/26/2020  1:40 PM  Signed Nursing Pain Medication Assessment:  Safety precautions to be maintained throughout the outpatient stay will include: orient to surroundings, keep bed in low position, maintain call bell within reach at all times, provide assistance with transfer out of bed and ambulation.  Medication Inspection Compliance: Pill count conducted under aseptic conditions, in front of the patient. Neither the pills nor the bottle was removed from the patient's sight at any time. Once count was completed pills were immediately returned to the patient in their original bottle.  Medication: Oxycodone/APAP Pill/Patch Count: 5 of 30 pills remain Pill/Patch Appearance: Markings consistent with prescribed medication Bottle  Appearance: Standard pharmacy container. Clearly labeled. Filled Date: 12 / 20 / 2021 Last Medication intake:  Yesterday   Buprenorphine 15 mcg 1/4 Filled  05-08-2020 Last dose Tuesday     UDS:  Summary  Date Value Ref Range Status  03/03/2020 Note  Final    Comment:    ==================================================================== [employer] 13 (MW) ==================================================================== Test                             Result       Flag       Units  Drug Present and Declared for Prescription Verification   Buprenorphine                  15           EXPECTED   ng/mg creat   Norbuprenorphine               37           EXPECTED   ng/mg creat    Source of buprenorphine is a scheduled prescription medication.    Norbuprenorphine is an expected metabolite of buprenorphine.  Drug Absent but Declared for Prescription Verification   Oxycodone                      Not Detected UNEXPECTED ng/mg creat ==================================================================== Test                      Result    Flag   Units      Ref Range   Creatinine              124              mg/dL      >=20 ==================================================================== Declared Medications:  The flagging and interpretation on this report are based on the  following declared medications.  Unexpected results may arise from  inaccuracies in the declared medications.   **Note: The testing scope of this panel includes these medications:   Oxycodone (Percocet)   **Note: The testing scope of this panel does not include small to  moderate amounts of these reported medications:   Buprenorphine Patch (BuTrans)   **Note: The testing scope of this panel does not include the  following reported medications:   Acetaminophen (Percocet)  Hydroxyzine (Vistaril)  Methocarbamol (Robaxin)  Sertraline (Zoloft)  Triamcinolone (Kenalog) ==================================================================== For clinical consultation, please call [phone]. ====================================================================      ROS  Constitutional: Denies any fever or chills Gastrointestinal: No reported hemesis, hematochezia, vomiting, or acute GI distress Musculoskeletal: Right shoulder and rotator cuff pain Neurological: No reported episodes of acute onset apraxia, aphasia, dysarthria, agnosia, amnesia, paralysis, loss of coordination, or loss of consciousness  Medication Review  buprenorphine, diazepam, hydrOXYzine, methocarbamol, oxyCODONE-acetaminophen, sertraline, and triamcinolone cream  History Review  Allergy: Leslie Carrillo is allergic to lyrica [pregabalin], other, hydrocodone-acetaminophen, tramadol, codeine, lovastatin, and simvastatin. Drug: Leslie Carrillo  reports no history of drug use. Alcohol:  reports no history of alcohol use. Tobacco:  reports that she has been smoking. She has a 10.00 pack-year smoking history. She has never used smokeless tobacco. Social: Leslie Carrillo  reports that she has been smoking. She has a 10.00 pack-year smoking history. She has never used smokeless tobacco. She reports that she does not drink alcohol and does not use drugs. Medical:  has a past medical history of Anemia, Arthritis, Asthma, Chronic  kidney disease, and Chronic pain. Surgical: Leslie Carrillo  has a past surgical history that includes Cervical fusion (2014). Family: family history includes Dementia in her mother; Prostate cancer in her father; Renal Cyst in her father.  Laboratory Chemistry Profile   Renal Lab Results  Component Value Date   BUN 11 10/16/2019   CREATININE 0.89 10/16/2019   GFRAA >60 10/16/2019   GFRNONAA >60 10/16/2019     Hepatic Lab Results  Component Value Date   AST 20 10/16/2019   ALT 15 10/16/2019   ALBUMIN 4.0 10/16/2019   ALKPHOS 75 10/16/2019   LIPASE 29 10/16/2019     Electrolytes Lab Results  Component Value Date   NA 137 10/16/2019   K 3.3 (L) 10/16/2019   CL 104 10/16/2019   CALCIUM  8.8 (L) 10/16/2019     Bone No results found for: VD25OH, VD125OH2TOT, WU9811BJ4, NW2956OZ3, 25OHVITD1, 25OHVITD2, 25OHVITD3, TESTOFREE, TESTOSTERONE   Inflammation (CRP: Acute Phase) (ESR: Chronic Phase) No results found for: CRP, ESRSEDRATE, LATICACIDVEN     Note: Above Lab results reviewed.  Recent Imaging Review  MR SHOULDER RIGHT WO CONTRAST CLINICAL DATA:  Chronic right shoulder pain for the last 2 months, extending down the right arm. Numbness in the arm. Limited range of motion.  EXAM: MRI OF THE RIGHT SHOULDER WITHOUT CONTRAST  TECHNIQUE: Multiplanar, multisequence MR imaging of the shoulder was performed. No intravenous contrast was administered.  COMPARISON:  Radiographs of 12/15/2019  FINDINGS: Rotator cuff: Full-thickness partial width tear of the majority of the supraspinatus tendon, with a 0.5 cm gap between the distal and proximal ends of the tendon shown on image 13 of series 7. I suspect a small posterior portion of the supraspinatus is still attaching to the humeral head, although the tear is nearly complete  The irregular distal segment measures about 1.2 cm in length.  Moderate to prominent supraspinatus and moderate infraspinatus tendinopathy with mild fissuring in the infraspinatus and subscapularis tendons. There is no fatty atrophy or edema in the supraspinatus muscle.  Muscles: Atrophy and some fatty replacement in the teres minor muscle suggesting prior quadrilateral space syndrome. No current significant degree of muscular edema.  Biceps long head: Nonvisualization of the intra-articular segment of the long head, probably reflecting rupture.  Acromioclavicular Joint: Prominent spurring and mild subcortical marrow edema. Type I acromion. There is fluid in the subacromial subdeltoid bursa along with mild synovitis communicating with the glenohumeral joint. There is also fluid in the subcoracoid bursa.  Glenohumeral Joint: Mild degenerative  chondral thinning and mild spurring. Degenerative subcortical cyst formation anteriorly along the humeral head adjacent to the greater and lesser tuberosity and bicipital groove.  Labrum: Truncated superior labrum, probably torn, for example on image 13 of series 7.  Bones: No significant extra-articular osseous abnormalities identified.  Other: No supplemental non-categorized findings.  IMPRESSION: 1. Full-thickness partial width tear of the majority of the supraspinatus tendon, with a 0.5 cm gap between the distal and proximal ends of the tendon. This tear is nearly full width, with only small posterior portion of the supraspinatus tendon intact. Moderate to prominent supraspinatus and moderate infraspinatus tendinopathy with mild fissuring in the infraspinatus and subscapularis tendons. 2. Nonvisualization of the intra-articular segment of the long head of the biceps, probably reflecting rupture. 3. Truncated superior labrum, favoring a tear. 4. Prominent degenerative spurring of the acromioclavicular joint. Mild degenerative chondral thinning in the glenohumeral joint. 5. Atrophy and fatty replacement in the teres minor muscle suggesting prior quadrilateral space syndrome.  Electronically Signed   By: Van Clines M.D.   On: 02/24/2020 17:01 Note: Reviewed        Physical Exam  General appearance: Well nourished, well developed, and well hydrated. In no apparent acute distress Mental status: Alert, oriented x 3 (person, place, & time)       Respiratory: No evidence of acute respiratory distress Eyes: PERLA Vitals: BP 127/79    Pulse 70    Temp (!) 97.3 F (36.3 C)    Resp 18    Ht _0  (1.6 m)    Wt 173 lb (78.5 kg)    SpO2 98%    BMI 30.65 kg/m  BMI: Estimated body mass index is 30.65 kg/m as calculated from the following:   Height as of this encounter: _1  (1.6 m).   Weight as of this encounter: 173 lb (78.5 kg). Ideal: Ideal body weight: 52.4 kg (115 lb  8.3 oz) Adjusted ideal body weight: 62.8 kg (138 lb 8.2 oz)   Upper Extremity (UE) Exam    Side:Right upper extremity  Side:Left upper extremity  Skin & Extremity Inspection:Skin color, temperature, and hair growth are WNL. No peripheral edema or cyanosis. No masses, redness, swelling, asymmetry, or associated skin lesions. No contractures.  Skin & Extremity Inspection:Skin color, temperature, and hair growth are WNL. No peripheral edema or cyanosis. No masses, redness, swelling, asymmetry, or associated skin lesions. No contractures.  Functional CVE:LFYB restricted range of motion  Functional OFB:PZWCHENIDPOE ROM  Muscle Tone/Strength:Functionally intact. No obvious neuro-muscular anomalies detected.  Muscle Tone/Strength:Functionally intact. No obvious neuro-muscular anomalies detected.  Sensory (Neurological):Arthropathic arthralgia of right glenohumeral joint  Sensory (Neurological):Unimpaired  Palpation:No palpable anomalies  Palpation:No palpable anomalies  Provocative Test(s): Phalen's test:deferred Tinel's test:deferred Apley's scratch test (touch opposite shoulder): Action 1 (Across chest):Decreased Action 2 (Overhead):Decreased Action 3 (LB reach):Decreased   Provocative Test(s): Phalen's test:deferred Tinel's test:deferred Apley's scratch test (touch opposite shoulder): Action 1 (Across chest):deferred Action 2 (Overhead):deferred Action 3 (LB reach):deferred     Assessment   Status Diagnosis  Persistent Persistent Persistent 1. Right rotator cuff tear arthropathy   2. Tear of right supraspinatus tendon   3. Supraspinatus tendonitis, right   4. Infraspinatus tendinitis, right   5. Chronic pain syndrome      Updated Problems: Problem  Right Rotator Cuff Tear Arthropathy    Plan of Care   Leslie Carrillo has a current medication list which includes the following  long-term medication(s): sertraline, buprenorphine, methocarbamol, and oxycodone-acetaminophen.  Pharmacotherapy (Medications Ordered): Meds ordered this encounter  Medications   buprenorphine (BUTRANS) 15 MCG/HR    Sig: Place 1 patch onto the skin every 7 (seven) days.    Dispense:  4 patch    Refill:  2    For chronic pain syndrome   oxyCODONE-acetaminophen (PERCOCET/ROXICET) 5-325 MG tablet    Sig: Take 1 tablet by mouth as needed for severe pain (severe pain).    Dispense:  30 tablet    Refill:  0    For chronic pain syndrome   methocarbamol (ROBAXIN) 500 MG tablet    Sig: Take 1 tablet (500 mg total) by mouth 2 (two) times daily as needed for muscle spasms.    Dispense:  60 tablet    Refill:  5   Follow-up plan:   Return in about 3 months (around 08/26/2020) for Medication Management, in person.     Has tried cervical and thoracic epidural steroid injections with physiatry at [facility].  Consider thoracic TPI.  Consider lumbar facet medial branch nerve blocks for severe facet arthropathy lower lumbar spine present on x-ray.  Could also consider knee intra-articular steroid, Hyalgan, genicular nerve block. S/p right shoulder injection 12/23/19, 02/03/20 right shoulder MRI-supraspinatus tendon tear with infraspinatus tendinopathy, has been told that she needs to have right shoulder surgery, trying to figure out a time to do that.    Recent Visits Date Type Provider Dept  03/03/20 Office Visit Gillis Santa, MD [facility]  Showing recent visits within past 90 days and meeting all other requirements Today's Visits Date Type Provider Dept  05/26/20 Office Visit Gillis Santa, MD [facility]  Showing today's visits and meeting all other requirements Future Appointments No visits were found meeting these conditions. Showing future appointments within next 90 days and meeting all other requirements  I discussed the assessment and treatment plan with the patient. The  patient was provided an opportunity to ask questions and all were answered. The patient agreed with the plan and demonstrated an understanding of the instructions.  Patient advised to call back or seek an in-person evaluation if the symptoms or condition worsens.  Duration of encounter:1mnutes.  Note by: BGillis Santa MD Date: 05/26/2020; Time: 2:44 PM

## 2020-05-26 NOTE — Progress Notes (Signed)
Nursing Pain Medication Assessment:  Safety precautions to be maintained throughout the outpatient stay will include: orient to surroundings, keep bed in low position, maintain call bell within reach at all times, provide assistance with transfer out of bed and ambulation.  Medication Inspection Compliance: Pill count conducted under aseptic conditions, in front of the patient. Neither the pills nor the bottle was removed from the patient's sight at any time. Once count was completed pills were immediately returned to the patient in their original bottle.  Medication: Oxycodone/APAP Pill/Patch Count: 5 of 30 pills remain Pill/Patch Appearance: Markings consistent with prescribed medication Bottle Appearance: Standard pharmacy container. Clearly labeled. Filled Date: 65 / 20 / 2021 Last Medication intake:  Yesterday   Buprenorphine 15 mcg 1/4 Filled 05-08-2020 Last dose Tuesday

## 2020-06-02 ENCOUNTER — Ambulatory Visit: Payer: Medicare Other | Admitting: Student in an Organized Health Care Education/Training Program

## 2020-08-25 ENCOUNTER — Ambulatory Visit
Payer: Medicare Other | Attending: Student in an Organized Health Care Education/Training Program | Admitting: Student in an Organized Health Care Education/Training Program

## 2020-08-25 ENCOUNTER — Encounter: Payer: Self-pay | Admitting: Student in an Organized Health Care Education/Training Program

## 2020-08-25 ENCOUNTER — Other Ambulatory Visit: Payer: Self-pay

## 2020-08-25 VITALS — BP 131/81 | HR 77 | Temp 97.0°F | Ht 63.0 in | Wt 173.0 lb

## 2020-08-25 DIAGNOSIS — M7581 Other shoulder lesions, right shoulder: Secondary | ICD-10-CM | POA: Diagnosis present

## 2020-08-25 DIAGNOSIS — G894 Chronic pain syndrome: Secondary | ICD-10-CM | POA: Diagnosis present

## 2020-08-25 DIAGNOSIS — M75101 Unspecified rotator cuff tear or rupture of right shoulder, not specified as traumatic: Secondary | ICD-10-CM | POA: Diagnosis present

## 2020-08-25 DIAGNOSIS — M47816 Spondylosis without myelopathy or radiculopathy, lumbar region: Secondary | ICD-10-CM | POA: Diagnosis not present

## 2020-08-25 DIAGNOSIS — M12811 Other specific arthropathies, not elsewhere classified, right shoulder: Secondary | ICD-10-CM | POA: Diagnosis present

## 2020-08-25 DIAGNOSIS — M7591 Shoulder lesion, unspecified, right shoulder: Secondary | ICD-10-CM | POA: Diagnosis present

## 2020-08-25 MED ORDER — BUPRENORPHINE 15 MCG/HR TD PTWK: 1.0000 | MEDICATED_PATCH | TRANSDERMAL | 2 refills | Status: DC

## 2020-08-25 MED ORDER — OXYCODONE-ACETAMINOPHEN 5-325 MG PO TABS: 1.0000 | ORAL_TABLET | ORAL | 0 refills | Status: DC | PRN

## 2020-08-25 NOTE — Patient Instructions (Signed)
Stop Butrans patch 1 week prior to surgery Ok to take Oxycodone that week for pain if need be Do not restart patch until we meet again Best of luck with surgery

## 2020-08-25 NOTE — Progress Notes (Signed)
PROVIDER NOTE: Information contained herein reflects review and annotations entered in association with encounter. Interpretation of such information and data should be left to medically-trained personnel. Information provided to patient can be located elsewhere in the medical record under "Patient Instructions". Document created using STT-dictation technology, any transcriptional errors that may result from process are unintentional.    Patient: Leslie Carrillo  Service Category: E/M  Provider: Gillis Santa, MD  DOB: [birth year]  DOS: 08/25/2020  Specialty: Interventional Pain Management  MRN: [id number]  Setting: Ambulatory outpatient  PCP: Baxter Hire, MD  Type: Established Patient    Referring Provider: Baxter Hire, MD  Location: Office  Delivery: Face-to-face     HPI  [name], a 60 y.o. year old female, is here today because of her Right rotator cuff tear arthropathy [M75.101, M12.811]. [name] primary complain today is Back Pain  Last encounter: My last encounter with her was on 05/26/20  Pertinent problems: Leslie Carrillo has Chronic low back pain; DDD (degenerative disc disease), thoracic; Chronic radicular lumbar pain; Cervical facet joint syndrome; Bilateral primary osteoarthritis of knee; Lumbar degenerative disc disease; Chronic pain syndrome; Myofascial pain syndrome; and Lumbar spondylosis on their pertinent problem list. Pain Assessment: Severity of Chronic pain is reported as a 3 /10. Location: Back Right, Left/Denies. Onset: More than a month ago. Quality: Aching, Burning, Shooting, Throbbing. Timing: Constant. Modifying factor(s): pain meds and laying down. Vitals:  height is $RemoveB'5\' 3"'BVVCWngb$  (1.6 m) and weight is 173 lb (78.5 kg). Her temperature is 97 F (36.1 C) (abnormal). Her blood pressure is 131/81 and her pulse is 77. Her oxygen saturation is 97%.   Reason for encounter: medication management.    Leslie Carrillo follows up today for medication management.  She saw Dr. Roland Rack and  there is a tentative surgery for her right shoulder and right rotator cuff at the beginning of August.  She is finding benefit with Butrans patch.  She states that it is helping to manage her pain.  She takes oxycodone for breakthrough pain she takes sparingly.  I have instructed her to discontinue her Butrans patch 1 week prior to her surgery and to keep it off until she follows up with me after surgery.  I have given her a surgeons letter which states that her surgical team can manage her postoperative pain with what ever they deem necessary.  Patient will follow up with me 4 weeks after her shoulder surgery.  Patient endorsed understanding.  From 3/10 visit: Since the patient's last visit with me, she has been evaluated by orthopedic surgery, Dr. Roland Rack who has recommended right shoulder surgery.  Patient is trying to figure out a time where she can do this that is conducive to her schedule.,  At her last visit with me we increase her Butrans patch from 10 mcg an hour to 15 mcg an hour.  She states that this is doing a better job at helping to manage her pain.  She is utilizing approximately 8-10 Percocets a month for breakthrough pain which is very reasonable until she is able to have surgery.  We will refill as below and patient instructed to follow-up in 3 months.  Pharmacotherapy Assessment   Analgesic: Buprenorphine transdermal patch, 15 mcg an hour, oxycodone 5 mg daily as needed for breakthrough pain, quantity #30 to last for 90 days MME/day: 10 mg/day   Monitoring:  PMP: PDMP reviewed during this encounter.       Pharmacotherapy: No side-effects or adverse reactions  reported. Compliance: No problems identified. Effectiveness: Clinically acceptable.  Leslie Fischer, RN  08/25/2020  2:54 PM  Sign when Signing Visit Safety precautions to be maintained throughout the outpatient stay will include: orient to surroundings, keep bed in low position, maintain call bell within reach at all times,  provide assistance with transfer out of bed and ambulation.  Nursing Pain Medication Assessment:  Safety precautions to be maintained throughout the outpatient stay will include: orient to surroundings, keep bed in low position, maintain call bell within reach at all times, provide assistance with transfer out of bed and ambulation.  Medication Inspection Compliance: Pill count conducted under aseptic conditions, in front of the patient. Neither the pills nor the bottle was removed from the patient's sight at any time. Once count was completed pills were immediately returned to the patient in their original bottle.  Medication #1: Buprenorphine (Suboxone) Pill/Patch Count:  0 of 4 pills remain Pill/Patch Appearance: Markings consistent with prescribed medication Bottle Appearance: Standard pharmacy container. Clearly labeled. Filled Date: 5 / 12 / 2022 Last Medication intake:   08/23/20  Medication #2: Oxycodone/APAP Pill/Patch Count:  4 of 30 patches remain Pill/Patch Appearance: Markings consistent with prescribed medication Bottle Appearance: Standard pharmacy container. Clearly labeled. Filled Date: 5/ 14/  22 Last Medication intake:  Today      UDS:  Summary  Date Value Ref Range Status  03/03/2020 Note  Final    Comment:    ==================================================================== [facility] (MW) ==================================================================== Test                             Result       Flag       Units  Drug Present and Declared for Prescription Verification   Buprenorphine                  15           EXPECTED   ng/mg creat   Norbuprenorphine               37           EXPECTED   ng/mg creat    Source of buprenorphine is a scheduled prescription medication.    Norbuprenorphine is an expected metabolite of buprenorphine.  Drug Absent but Declared for Prescription Verification   Oxycodone                      Not Detected UNEXPECTED  ng/mg creat ==================================================================== Test                      Result    Flag   Units      Ref Range   Creatinine              124              mg/dL      >=20 ==================================================================== Declared Medications:  The flagging and interpretation on this report are based on the  following declared medications.  Unexpected results may arise from  inaccuracies in the declared medications.   **Note: The testing scope of this panel includes these medications:   Oxycodone (Percocet)   **Note: The testing scope of this panel does not include small to  moderate amounts of these reported medications:   Buprenorphine Patch (BuTrans)   **Note: The testing scope of this panel does not include the  following reported  medications:   Acetaminophen (Percocet)  Hydroxyzine (Vistaril)  Methocarbamol (Robaxin)  Sertraline (Zoloft)  Triamcinolone (Kenalog) ==================================================================== For clinical consultation, please call [phone]. ====================================================================      ROS  Constitutional: Denies any fever or chills Gastrointestinal: No reported hemesis, hematochezia, vomiting, or acute GI distress Musculoskeletal:  Right shoulder and rotator cuff pain Neurological: No reported episodes of acute onset apraxia, aphasia, dysarthria, agnosia, amnesia, paralysis, loss of coordination, or loss of consciousness  Medication Review  buprenorphine, hydrOXYzine, methocarbamol, oxyCODONE-acetaminophen, sertraline, and triamcinolone cream  History Review  Allergy: Leslie Carrillo is allergic to lyrica [pregabalin], other, hydrocodone-acetaminophen, tramadol, codeine, lovastatin, and simvastatin. Drug: Leslie Carrillo  reports no history of drug use. Alcohol:  reports no history of alcohol use. Tobacco:  reports that she has been smoking cigarettes. She  has a 10.00 pack-year smoking history. She has never used smokeless tobacco. Social: Leslie Carrillo  reports that she has been smoking cigarettes. She has a 10.00 pack-year smoking history. She has never used smokeless tobacco. She reports that she does not drink alcohol and does not use drugs. Medical:  has a past medical history of Anemia, Arthritis, Asthma, Chronic kidney disease, and Chronic pain. Surgical: Leslie Carrillo  has a past surgical history that includes Cervical fusion (2014). Family: family history includes Dementia in her mother; Prostate cancer in her father; Renal Cyst in her father.  Laboratory Chemistry Profile   Renal Lab Results  Component Value Date   BUN 11 10/16/2019   CREATININE 0.89 10/16/2019   GFRAA >60 10/16/2019   GFRNONAA >60 10/16/2019     Hepatic Lab Results  Component Value Date   AST 20 10/16/2019   ALT 15 10/16/2019   ALBUMIN 4.0 10/16/2019   ALKPHOS 75 10/16/2019   LIPASE 29 10/16/2019     Electrolytes Lab Results  Component Value Date   NA 137 10/16/2019   K 3.3 (L) 10/16/2019   CL 104 10/16/2019   CALCIUM 8.8 (L) 10/16/2019     Bone No results found for: VD25OH, VD125OH2TOT, DG3875IE3, PI9518AC1, 25OHVITD1, 25OHVITD2, 25OHVITD3, TESTOFREE, TESTOSTERONE   Inflammation (CRP: Acute Phase) (ESR: Chronic Phase) No results found for: CRP, ESRSEDRATE, LATICACIDVEN     Note: Above Lab results reviewed.  Recent Imaging Review  MR SHOULDER RIGHT WO CONTRAST CLINICAL DATA:  Chronic right shoulder pain for the last 2 months, extending down the right arm. Numbness in the arm. Limited range of motion.  EXAM: MRI OF THE RIGHT SHOULDER WITHOUT CONTRAST  TECHNIQUE: Multiplanar, multisequence MR imaging of the shoulder was performed. No intravenous contrast was administered.  COMPARISON:  Radiographs of 12/15/2019  FINDINGS: Rotator cuff: Full-thickness partial width tear of the majority of the supraspinatus tendon, with a 0.5 cm gap between the  distal and proximal ends of the tendon shown on image 13 of series 7. I suspect a small posterior portion of the supraspinatus is still attaching to the humeral head, although the tear is nearly complete  The irregular distal segment measures about 1.2 cm in length.  Moderate to prominent supraspinatus and moderate infraspinatus tendinopathy with mild fissuring in the infraspinatus and subscapularis tendons. There is no fatty atrophy or edema in the supraspinatus muscle.  Muscles: Atrophy and some fatty replacement in the teres minor muscle suggesting prior quadrilateral space syndrome. No current significant degree of muscular edema.  Biceps long head: Nonvisualization of the intra-articular segment of the long head, probably reflecting rupture.  Acromioclavicular Joint: Prominent spurring and mild subcortical marrow edema. Type I acromion. There is  fluid in the subacromial subdeltoid bursa along with mild synovitis communicating with the glenohumeral joint. There is also fluid in the subcoracoid bursa.  Glenohumeral Joint: Mild degenerative chondral thinning and mild spurring. Degenerative subcortical cyst formation anteriorly along the humeral head adjacent to the greater and lesser tuberosity and bicipital groove.  Labrum: Truncated superior labrum, probably torn, for example on image 13 of series 7.  Bones: No significant extra-articular osseous abnormalities identified.  Other: No supplemental non-categorized findings.  IMPRESSION: 1. Full-thickness partial width tear of the majority of the supraspinatus tendon, with a 0.5 cm gap between the distal and proximal ends of the tendon. This tear is nearly full width, with only small posterior portion of the supraspinatus tendon intact. Moderate to prominent supraspinatus and moderate infraspinatus tendinopathy with mild fissuring in the infraspinatus and subscapularis tendons. 2. Nonvisualization of the intra-articular  segment of the long head of the biceps, probably reflecting rupture. 3. Truncated superior labrum, favoring a tear. 4. Prominent degenerative spurring of the acromioclavicular joint. Mild degenerative chondral thinning in the glenohumeral joint. 5. Atrophy and fatty replacement in the teres minor muscle suggesting prior quadrilateral space syndrome.  Electronically Signed   By: Van Clines M.D.   On: 02/24/2020 17:01 Note: Reviewed        Physical Exam  General appearance: Well nourished, well developed, and well hydrated. In no apparent acute distress Mental status: Alert, oriented x 3 (person, place, & time)       Respiratory: No evidence of acute respiratory distress Eyes: PERLA Vitals: BP 131/81   Pulse 77   Temp (!) 97 F (36.1 C)   Ht $R'5\' 3"'dy$  (1.6 m)   Wt 173 lb (78.5 kg)   SpO2 97%   BMI 30.65 kg/m  BMI: Estimated body mass index is 30.65 kg/m as calculated from the following:   Height as of this encounter: $RemoveBeforeD'5\' 3"'ydcBHrNVStKsjC$  (1.6 m).   Weight as of this encounter: 173 lb (78.5 kg). Ideal: Ideal body weight: 52.4 kg (115 lb 8.3 oz) Adjusted ideal body weight: 62.8 kg (138 lb 8.2 oz)   Upper Extremity (UE) Exam      Side: Right upper extremity   Side: Left upper extremity  Skin & Extremity Inspection: Skin color, temperature, and hair growth are WNL. No peripheral edema or cyanosis. No masses, redness, swelling, asymmetry, or associated skin lesions. No contractures.   Skin & Extremity Inspection: Skin color, temperature, and hair growth are WNL. No peripheral edema or cyanosis. No masses, redness, swelling, asymmetry, or associated skin lesions. No contractures.  Functional ROM:  Pain restricted range of motion         Functional ROM: Unrestricted ROM          Muscle Tone/Strength: Functionally intact. No obvious neuro-muscular anomalies detected.   Muscle Tone/Strength: Functionally intact. No obvious neuro-muscular anomalies detected.  Sensory (Neurological):  Arthropathic  arthralgia of right glenohumeral joint         Sensory (Neurological): Unimpaired          Palpation: No palpable anomalies               Palpation: No palpable anomalies              Provocative Test(s):  Phalen's test: deferred Tinel's test: deferred Apley's scratch test (touch opposite shoulder):  Action 1 (Across chest):  Decreased Action 2 (Overhead):  Decreased Action 3 (LB reach):  Decreased     Provocative Test(s):  Phalen's test: deferred Tinel's test: deferred Apley's  scratch test (touch opposite shoulder):  Action 1 (Across chest): deferred Action 2 (Overhead): deferred Action 3 (LB reach): deferred        Assessment   Status Diagnosis  Persistent Persistent Persistent 1. Right rotator cuff tear arthropathy   2. Tear of right supraspinatus tendon   3. Supraspinatus tendonitis, right   4. Infraspinatus tendinitis, right   5. Lumbar facet arthropathy   6. Lumbar spondylosis   7. Chronic pain syndrome        Plan of Care   Leslie Carrillo has a current medication list which includes the following long-term medication(s): methocarbamol, sertraline, buprenorphine, and oxycodone-acetaminophen.  Pharmacotherapy (Medications Ordered): Meds ordered this encounter  Medications   buprenorphine (BUTRANS) 15 MCG/HR    Sig: Place 1 patch onto the skin every 7 (seven) days.    Dispense:  4 patch    Refill:  2    For chronic pain syndrome   oxyCODONE-acetaminophen (PERCOCET/ROXICET) 5-325 MG tablet    Sig: Take 1-2 tablets by mouth as needed for severe pain (severe pain).    Dispense:  45 tablet    Refill:  0    For chronic pain syndrome    Follow-up plan:   Return in about 3 months (around 11/25/2020) for Medication Management, in person.     Has tried cervical and thoracic epidural steroid injections with physiatry at [facility].  Consider thoracic TPI.  Consider lumbar facet medial branch nerve blocks for severe facet arthropathy lower lumbar spine present on x-ray.   Could also consider knee intra-articular steroid, Hyalgan, genicular nerve block. S/p right shoulder injection 12/23/19, 02/03/20 right shoulder MRI-supraspinatus tendon tear with infraspinatus tendinopathy, has been told that she needs to have right shoulder surgery, tentative plan early August.    Recent Visits No visits were found meeting these conditions. Showing recent visits within past 90 days and meeting all other requirements Today's Visits Date Type Provider Dept  08/25/20 Office Visit Gillis Santa, MD [facility]  Showing today's visits and meeting all other requirements Future Appointments No visits were found meeting these conditions. Showing future appointments within next 90 days and meeting all other requirements I discussed the assessment and treatment plan with the patient. The patient was provided an opportunity to ask questions and all were answered. The patient agreed with the plan and demonstrated an understanding of the instructions.  Patient advised to call back or seek an in-person evaluation if the symptoms or condition worsens.  Duration of encounter:68minutes.  Note by: Gillis Santa, MD Date: 08/25/2020; Time: 3:10 PM

## 2020-08-25 NOTE — Progress Notes (Signed)
Safety precautions to be maintained throughout the outpatient stay will include: orient to surroundings, keep bed in low position, maintain call bell within reach at all times, provide assistance with transfer out of bed and ambulation.  Nursing Pain Medication Assessment:  Safety precautions to be maintained throughout the outpatient stay will include: orient to surroundings, keep bed in low position, maintain call bell within reach at all times, provide assistance with transfer out of bed and ambulation.  Medication Inspection Compliance: Pill count conducted under aseptic conditions, in front of the patient. Neither the pills nor the bottle was removed from the patient's sight at any time. Once count was completed pills were immediately returned to the patient in their original bottle.  Medication #1: Buprenorphine (Suboxone) Pill/Patch Count:  0 of 4 pills remain Pill/Patch Appearance: Markings consistent with prescribed medication Bottle Appearance: Standard pharmacy container. Clearly labeled. Filled Date: 5 / 12 / 2022 Last Medication intake:   08/23/20  Medication #2: Oxycodone/APAP Pill/Patch Count:  4 of 30 patches remain Pill/Patch Appearance: Markings consistent with prescribed medication Bottle Appearance: Standard pharmacy container. Clearly labeled. Filled Date: 5/ 14/  22 Last Medication intake:  Today

## 2020-08-26 ENCOUNTER — Telehealth: Payer: Self-pay | Admitting: Student in an Organized Health Care Education/Training Program

## 2020-08-26 NOTE — Telephone Encounter (Signed)
Called pharm and they need a frequency on Medication. Called Dr Cherylann Ratel, message left to call me

## 2020-08-26 NOTE — Telephone Encounter (Signed)
Clarification of order given from Dr Cherylann Ratel. Called pharm and informed them

## 2020-10-07 ENCOUNTER — Other Ambulatory Visit: Payer: Self-pay | Admitting: Surgery

## 2020-11-03 ENCOUNTER — Other Ambulatory Visit
Admission: RE | Admit: 2020-11-03 | Discharge: 2020-11-03 | Disposition: A | Discharge status: Home / Self Care | Payer: Medicare Other | Source: Ambulatory Visit | Attending: Surgery | Admitting: Surgery

## 2020-11-03 HISTORY — DX: Personal history of urinary calculi: Z87.442

## 2020-11-03 NOTE — Patient Instructions (Addendum)
Your procedure is scheduled on: 11/08/20 - Tuesday Report to the Registration Desk on the 1st floor of [facility]. To find out your arrival time, please call [phone] between 1PM - 3PM on: 11/07/20 - Monday  Report to [facility] for EKG on 11/07/20 at 11am.  REMEMBER: Instructions that are not followed completely may result in serious medical risk, up to and including death; or upon the discretion of your surgeon and anesthesiologist your surgery may need to be rescheduled.  Do not eat food after midnight the night before surgery.  No gum chewing, lozengers or hard candies.  You may however, drink CLEAR liquids up to 2 hours before you are scheduled to arrive for your surgery. Do not drink anything within 2 hours of your scheduled arrival time.  Clear liquids include: - water  - apple juice without pulp - gatorade (not RED, PURPLE, OR BLUE) - black coffee or tea (Do NOT add milk or creamers to the coffee or tea) Do NOT drink anything that is not on this list.  In addition, your doctor has ordered for you to drink the provided  Ensure Pre-Surgery Clear Carbohydrate Drink Drinking this carbohydrate drink up to two hours before surgery helps to reduce insulin resistance and improve patient outcomes. Please complete drinking 2 hours prior to scheduled arrival time.  TAKE THESE MEDICATIONS THE MORNING OF SURGERY WITH A SIP OF WATER:  - oxyCODONE-acetaminophen (PERCOCET/ROXICET) 5-325 MG tablet - sertraline (ZOLOFT) 100 MG tablet  One week prior to surgery: Stop Anti-inflammatories (NSAIDS) such as Advil, Aleve, Ibuprofen, Motrin, Naproxen, Naprosyn and Aspirin based products such as Excedrin, Goodys Powder, BC Powder.  Stop ANY OVER THE COUNTER supplements until after surgery.  You may take Tylenol as directed if needed for pain up until the day of surgery.  No Alcohol for 24 hours before or after surgery.  No Smoking including e-cigarettes for 24 hours prior to surgery.   No chewable tobacco products for at least 6 hours prior to surgery.  No nicotine patches on the day of surgery.  Do not use any "recreational" drugs for at least a week prior to your surgery.  Please be advised that the combination of cocaine and anesthesia may have negative outcomes, up to and including death. If you test positive for cocaine, your surgery will be cancelled.  On the morning of surgery brush your teeth with toothpaste and water, you may rinse your mouth with mouthwash if you wish. Do not swallow any toothpaste or mouthwash.  Do not wear jewelry, make-up, hairpins, clips or nail polish.  Do not wear lotions, powders, or perfumes.   Do not shave body from the neck down 48 hours prior to surgery just in case you cut yourself which could leave a site for infection.  Also, freshly shaved skin may become irritated if using the CHG soap.  Contact lenses, hearing aids and dentures may not be worn into surgery.  Do not bring valuables to the hospital. [facility] is not responsible for any missing/lost belongings or valuables.   Use CHG Soap or wipes as directed on instruction sheet.  Notify your doctor if there is any change in your medical condition (cold, fever, infection).  Wear comfortable clothing (specific to your surgery type) to the hospital.  After surgery, you can help prevent lung complications by doing breathing exercises.  Take deep breaths and cough every 1-2 hours. Your doctor may order a device called an Incentive Spirometer to help you take deep  breaths. When coughing or sneezing, hold a pillow firmly against your incision with both hands. This is called "splinting." Doing this helps protect your incision. It also decreases belly discomfort.  If you are being admitted to the hospital overnight, leave your suitcase in the car. After surgery it may be brought to your room.  If you are being discharged the day of surgery, you will not be allowed to drive  home. You will need a responsible adult (18 years or older) to drive you home and stay with you that night.   If you are taking public transportation, you will need to have a responsible adult (18 years or older) with you. Please confirm with your physician that it is acceptable to use public transportation.   Please call the Pre-admissions Testing Dept. at [phone] if you have any questions about these instructions.  Surgery Visitation Policy:  Patients undergoing a surgery or procedure may have one family member or support person with them as long as that person is not COVID-19 positive or experiencing its symptoms.  That person may remain in the waiting area during the procedure.  Inpatient Visitation:    Visiting hours are 7 a.m. to 8 p.m. Inpatients will be allowed two visitors daily. The visitors may change each day during the patient's stay. No visitors under the age of 89. Any visitor under the age of 44 must be accompanied by an adult. The visitor must pass COVID-19 screenings, use hand sanitizer when entering and exiting the patient's room and wear a mask at all times, including in the patient's room. Patients must also wear a mask when staff or their visitor are in the room. Masking is required regardless of vaccination status.

## 2020-11-07 ENCOUNTER — Encounter
Admission: RE | Admit: 2020-11-07 | Discharge: 2020-11-07 | Disposition: A | Discharge status: Home / Self Care | Payer: Medicare Other | Source: Ambulatory Visit | Attending: Surgery | Admitting: Surgery

## 2020-11-07 ENCOUNTER — Other Ambulatory Visit: Payer: Self-pay

## 2020-11-07 DIAGNOSIS — Z01812 Encounter for preprocedural laboratory examination: Secondary | ICD-10-CM | POA: Insufficient documentation

## 2020-11-08 ENCOUNTER — Other Ambulatory Visit: Payer: Self-pay

## 2020-11-08 ENCOUNTER — Encounter: Payer: Self-pay | Admitting: Surgery

## 2020-11-08 ENCOUNTER — Encounter: Payer: Self-pay | Admitting: Emergency Medicine

## 2020-11-08 ENCOUNTER — Ambulatory Visit: Payer: Medicare Other | Admitting: Anesthesiology

## 2020-11-08 ENCOUNTER — Emergency Department: Payer: Medicare Other

## 2020-11-08 ENCOUNTER — Ambulatory Visit: Payer: Medicare Other

## 2020-11-08 ENCOUNTER — Ambulatory Visit
Admission: RE | Admit: 2020-11-08 | Discharge: 2020-11-08 | Disposition: A | Discharge status: Home / Self Care | Payer: Medicare Other | Attending: Surgery | Admitting: Surgery

## 2020-11-08 ENCOUNTER — Encounter
Admission: RE | Disposition: A | Discharge status: Home / Self Care | Payer: Self-pay | Source: Home / Self Care | Attending: Surgery

## 2020-11-08 DIAGNOSIS — X58XXXA Exposure to other specified factors, initial encounter: Secondary | ICD-10-CM | POA: Diagnosis not present

## 2020-11-08 DIAGNOSIS — M25511 Pain in right shoulder: Secondary | ICD-10-CM

## 2020-11-08 DIAGNOSIS — Z886 Allergy status to analgesic agent status: Secondary | ICD-10-CM | POA: Insufficient documentation

## 2020-11-08 DIAGNOSIS — M75121 Complete rotator cuff tear or rupture of right shoulder, not specified as traumatic: Secondary | ICD-10-CM | POA: Diagnosis not present

## 2020-11-08 DIAGNOSIS — S46111A Strain of muscle, fascia and tendon of long head of biceps, right arm, initial encounter: Secondary | ICD-10-CM | POA: Diagnosis not present

## 2020-11-08 DIAGNOSIS — Z885 Allergy status to narcotic agent status: Secondary | ICD-10-CM | POA: Insufficient documentation

## 2020-11-08 DIAGNOSIS — R079 Chest pain, unspecified: Secondary | ICD-10-CM

## 2020-11-08 DIAGNOSIS — Z79899 Other long term (current) drug therapy: Secondary | ICD-10-CM | POA: Insufficient documentation

## 2020-11-08 DIAGNOSIS — Z5321 Procedure and treatment not carried out due to patient leaving prior to being seen by health care provider: Secondary | ICD-10-CM | POA: Insufficient documentation

## 2020-11-08 DIAGNOSIS — Z888 Allergy status to other drugs, medicaments and biological substances status: Secondary | ICD-10-CM | POA: Insufficient documentation

## 2020-11-08 DIAGNOSIS — R0602 Shortness of breath: Secondary | ICD-10-CM | POA: Insufficient documentation

## 2020-11-08 HISTORY — PX: SHOULDER ARTHROSCOPY WITH SUBACROMIAL DECOMPRESSION, ROTATOR CUFF REPAIR AND BICEP TENDON REPAIR: SHX5687

## 2020-11-08 LAB — BASIC METABOLIC PANEL
Anion gap: 12 (ref 5–15)
BUN: 11 mg/dL (ref 8–23)
CO2: 23 mmol/L (ref 22–32)
Calcium: 9 mg/dL (ref 8.9–10.3)
Chloride: 102 mmol/L (ref 98–111)
Creatinine, Ser: 0.97 mg/dL (ref 0.44–1.00)
GFR, Estimated: >60 mL/min (ref >60)
Glucose, Bld: 308 mg/dL — ABNORMAL HIGH (ref 70–99)
Potassium: 3.9 mmol/L (ref 3.5–5.1)
Sodium: 137 mmol/L (ref 135–145)

## 2020-11-08 LAB — CBC
HCT: 39.1 % (ref 36.0–46.0)
Hemoglobin: 14 g/dL (ref 12.0–15.0)
MCH: 34.1 pg — ABNORMAL HIGH (ref 26.0–34.0)
MCHC: 35.8 g/dL (ref 30.0–36.0)
MCV: 95.1 fL (ref 80.0–100.0)
Platelets: 234 10*3/uL (ref 150–400)
RBC: 4.11 MIL/uL (ref 3.87–5.11)
RDW: 12.6 % (ref 11.5–15.5)
WBC: 20.9 10*3/uL — ABNORMAL HIGH (ref 4.0–10.5)
nRBC: 0 % (ref 0.0–0.2)

## 2020-11-08 LAB — TROPONIN I (HIGH SENSITIVITY): Troponin I (High Sensitivity): 5 ng/L (ref <18)

## 2020-11-08 SURGERY — SHOULDER ARTHROSCOPY WITH SUBACROMIAL DECOMPRESSION, ROTATOR CUFF REPAIR AND BICEP TENDON REPAIR
Anesthesia: General | Site: Shoulder | Laterality: Right

## 2020-11-08 MED ORDER — FENTANYL CITRATE (PF) 100 MCG/2ML IJ SOLN: 50.0000 ug | Freq: Once | INTRAMUSCULAR | Status: AC

## 2020-11-08 MED ORDER — OXYCODONE HCL 5 MG PO TABS: 10.0000 mg | ORAL_TABLET | ORAL | Status: DC | PRN

## 2020-11-08 MED ORDER — EPINEPHRINE PF 1 MG/ML IJ SOLN
INTRAMUSCULAR | Status: AC
  Filled 2020-11-08: qty 4

## 2020-11-08 MED ORDER — LACTATED RINGERS IV SOLN: INTRAVENOUS | Status: DC | PRN

## 2020-11-08 MED ORDER — PROPOFOL 10 MG/ML IV BOLUS
INTRAVENOUS | Status: DC | PRN
  Administered 2020-11-08: 160 mg via INTRAVENOUS

## 2020-11-08 MED ORDER — FENTANYL CITRATE (PF) 100 MCG/2ML IJ SOLN
INTRAMUSCULAR | Status: AC
  Filled 2020-11-08: qty 2

## 2020-11-08 MED ORDER — CHLORHEXIDINE GLUCONATE 0.12 % MT SOLN
OROMUCOSAL | Status: AC
  Administered 2020-11-08: 15 mL via OROMUCOSAL
  Filled 2020-11-08: qty 15

## 2020-11-08 MED ORDER — OXYCODONE HCL 5 MG PO TABS: 5.0000 mg | ORAL_TABLET | ORAL | 0 refills | Status: DC | PRN

## 2020-11-08 MED ORDER — DEXAMETHASONE SODIUM PHOSPHATE 10 MG/ML IJ SOLN
INTRAMUSCULAR | Status: DC | PRN
  Administered 2020-11-08: 10 mg via INTRAVENOUS

## 2020-11-08 MED ORDER — ROCURONIUM BROMIDE 100 MG/10ML IV SOLN
INTRAVENOUS | Status: DC | PRN
  Administered 2020-11-08: 80 mg via INTRAVENOUS

## 2020-11-08 MED ORDER — BUPIVACAINE-EPINEPHRINE 0.5% -1:200000 IJ SOLN
INTRAMUSCULAR | Status: DC | PRN
  Administered 2020-11-08: 30 mL

## 2020-11-08 MED ORDER — CEFAZOLIN SODIUM-DEXTROSE 2-4 GM/100ML-% IV SOLN
2.0000 g | INTRAVENOUS | Status: AC
  Administered 2020-11-08: 2 g via INTRAVENOUS

## 2020-11-08 MED ORDER — PHENYLEPHRINE HCL (PRESSORS) 10 MG/ML IV SOLN
INTRAVENOUS | Status: DC | PRN
  Administered 2020-11-08 (×3): 100 ug via INTRAVENOUS

## 2020-11-08 MED ORDER — MIDAZOLAM HCL 2 MG/2ML IJ SOLN: 1.0000 mg | Freq: Once | INTRAMUSCULAR | Status: AC

## 2020-11-08 MED ORDER — LACTATED RINGERS IV SOLN: INTRAVENOUS | Status: DC

## 2020-11-08 MED ORDER — ONDANSETRON HCL 4 MG/2ML IJ SOLN
INTRAMUSCULAR | Status: DC | PRN
  Administered 2020-11-08: 4 mg via INTRAVENOUS

## 2020-11-08 MED ORDER — FENTANYL CITRATE (PF) 100 MCG/2ML IJ SOLN: 25.0000 ug | INTRAMUSCULAR | Status: DC | PRN

## 2020-11-08 MED ORDER — BUPIVACAINE HCL (PF) 0.5 % IJ SOLN
INTRAMUSCULAR | Status: AC
  Filled 2020-11-08: qty 10

## 2020-11-08 MED ORDER — SUGAMMADEX SODIUM 200 MG/2ML IV SOLN
INTRAVENOUS | Status: DC | PRN
  Administered 2020-11-08 (×4): 50 mg via INTRAVENOUS

## 2020-11-08 MED ORDER — POTASSIUM CHLORIDE IN NACL 20-0.9 MEQ/L-% IV SOLN: INTRAVENOUS | Status: DC

## 2020-11-08 MED ORDER — ONDANSETRON HCL 4 MG PO TABS: 4.0000 mg | ORAL_TABLET | Freq: Four times a day (QID) | ORAL | Status: DC | PRN

## 2020-11-08 MED ORDER — FENTANYL CITRATE (PF) 100 MCG/2ML IJ SOLN
INTRAMUSCULAR | Status: AC
  Administered 2020-11-08: 50 ug via INTRAVENOUS
  Filled 2020-11-08: qty 2

## 2020-11-08 MED ORDER — MIDAZOLAM HCL 2 MG/2ML IJ SOLN
INTRAMUSCULAR | Status: AC
  Administered 2020-11-08: 1 mg via INTRAVENOUS
  Filled 2020-11-08: qty 2

## 2020-11-08 MED ORDER — LIDOCAINE HCL (PF) 1 % IJ SOLN
INTRAMUSCULAR | Status: AC
  Filled 2020-11-08: qty 5

## 2020-11-08 MED ORDER — FAMOTIDINE 20 MG PO TABS: 20.0000 mg | ORAL_TABLET | Freq: Once | ORAL | Status: AC

## 2020-11-08 MED ORDER — CHLORHEXIDINE GLUCONATE 0.12 % MT SOLN: 15.0000 mL | Freq: Once | OROMUCOSAL | Status: AC

## 2020-11-08 MED ORDER — ORAL CARE MOUTH RINSE: 15.0000 mL | Freq: Once | OROMUCOSAL | Status: AC

## 2020-11-08 MED ORDER — CEFAZOLIN SODIUM-DEXTROSE 2-4 GM/100ML-% IV SOLN
INTRAVENOUS | Status: AC
  Filled 2020-11-08: qty 100

## 2020-11-08 MED ORDER — METOCLOPRAMIDE HCL 10 MG PO TABS: 5.0000 mg | ORAL_TABLET | Freq: Three times a day (TID) | ORAL | Status: DC | PRN

## 2020-11-08 MED ORDER — ONDANSETRON HCL 4 MG/2ML IJ SOLN: 4.0000 mg | Freq: Four times a day (QID) | INTRAMUSCULAR | Status: DC | PRN

## 2020-11-08 MED ORDER — PROPOFOL 10 MG/ML IV BOLUS
INTRAVENOUS | Status: AC
  Filled 2020-11-08: qty 20

## 2020-11-08 MED ORDER — FAMOTIDINE 20 MG PO TABS
ORAL_TABLET | ORAL | Status: AC
  Administered 2020-11-08: 20 mg via ORAL
  Filled 2020-11-08: qty 1

## 2020-11-08 MED ORDER — METOCLOPRAMIDE HCL 5 MG/ML IJ SOLN: 5.0000 mg | Freq: Three times a day (TID) | INTRAMUSCULAR | Status: DC | PRN

## 2020-11-08 MED ORDER — GLYCOPYRROLATE 0.2 MG/ML IJ SOLN
INTRAMUSCULAR | Status: DC | PRN
  Administered 2020-11-08: .2 mg via INTRAVENOUS

## 2020-11-08 MED ORDER — FENTANYL CITRATE (PF) 100 MCG/2ML IJ SOLN
INTRAMUSCULAR | Status: DC | PRN
  Administered 2020-11-08: 50 ug via INTRAVENOUS
  Administered 2020-11-08 (×2): 25 ug via INTRAVENOUS

## 2020-11-08 MED ORDER — BUPIVACAINE LIPOSOME 1.3 % IJ SUSP
INTRAMUSCULAR | Status: AC
  Filled 2020-11-08: qty 20

## 2020-11-08 MED ORDER — BUPIVACAINE LIPOSOME 1.3 % IJ SUSP
INTRAMUSCULAR | Status: DC | PRN
  Administered 2020-11-08: 13 mL via PERINEURAL
  Administered 2020-11-08: 7 mL via PERINEURAL

## 2020-11-08 MED ORDER — BUPIVACAINE HCL (PF) 0.5 % IJ SOLN
INTRAMUSCULAR | Status: DC | PRN
  Administered 2020-11-08: 7 mL via PERINEURAL
  Administered 2020-11-08: 3 mL via PERINEURAL

## 2020-11-08 MED ORDER — BUPIVACAINE-EPINEPHRINE (PF) 0.5% -1:200000 IJ SOLN
INTRAMUSCULAR | Status: AC
  Filled 2020-11-08: qty 30

## 2020-11-08 SURGICAL SUPPLY — 54 items
ANCH SUT 2 2/0 ABS BRD STRL (Anchor) ×1 IMPLANT
ANCH SUT 5.5 KNTLS (Anchor) ×2 IMPLANT
ANCH SUT Q-FX 2.8 (Anchor) ×2 IMPLANT
ANCHOR ALL-SUT Q-FIX 2.8 (Anchor) ×4 IMPLANT
ANCHOR HEALICOIL REGEN 5.5 (Anchor) ×4 IMPLANT
ANCHOR SUT W/ ORTHOCORD (Anchor) ×2 IMPLANT
APL PRP STRL LF DISP 70% ISPRP (MISCELLANEOUS) ×1
BIT DRILL JUGRKNT W/NDL BIT2.9 (DRILL) IMPLANT
BLADE FULL RADIUS 3.5 (BLADE) ×2 IMPLANT
BUR ACROMIONIZER 4.0 (BURR) ×2 IMPLANT
CANNULA SHAVER 8MMX76MM (CANNULA) ×2 IMPLANT
CHLORAPREP W/TINT 26 (MISCELLANEOUS) ×2 IMPLANT
COVER MAYO STAND REUSABLE (DRAPES) ×2 IMPLANT
DILATOR 5.5 THREADED HEALICOIL (MISCELLANEOUS) ×2 IMPLANT
DRAPE IMP U-DRAPE 54X76 (DRAPES) ×4 IMPLANT
DRILL JUGGERKNOT W/NDL BIT 2.9 (DRILL)
ELECT CAUTERY BLADE 6.4 (BLADE) ×2 IMPLANT
ELECT REM PT RETURN 9FT ADLT (ELECTROSURGICAL) ×2
ELECTRODE REM PT RTRN 9FT ADLT (ELECTROSURGICAL) ×1 IMPLANT
GAUZE SPONGE 4X4 12PLY STRL (GAUZE/BANDAGES/DRESSINGS) ×2 IMPLANT
GAUZE XEROFORM 1X8 LF (GAUZE/BANDAGES/DRESSINGS) ×2 IMPLANT
GLOVE SRG 8 PF TXTR STRL LF DI (GLOVE) ×1 IMPLANT
GLOVE SURG ENC MOIS LTX SZ7.5 (GLOVE) ×4 IMPLANT
GLOVE SURG ENC MOIS LTX SZ8 (GLOVE) ×4 IMPLANT
GLOVE SURG UNDER LTX SZ8 (GLOVE) ×2 IMPLANT
GLOVE SURG UNDER POLY LF SZ8 (GLOVE) ×2
GOWN STRL REUS W/ TWL LRG LVL3 (GOWN DISPOSABLE) ×1 IMPLANT
GOWN STRL REUS W/ TWL XL LVL3 (GOWN DISPOSABLE) ×1 IMPLANT
GOWN STRL REUS W/TWL LRG LVL3 (GOWN DISPOSABLE) ×2
GOWN STRL REUS W/TWL XL LVL3 (GOWN DISPOSABLE) ×2
GRASPER SUT 15 45D LOW PRO (SUTURE) ×4 IMPLANT
IV LACTATED RINGER IRRG 3000ML (IV SOLUTION) ×4
IV LR IRRIG 3000ML ARTHROMATIC (IV SOLUTION) ×2 IMPLANT
KIT CANNULA 8X76-LX IN CANNULA (CANNULA) ×4 IMPLANT
KIT SUTURE 2.8 Q-FIX DISP (MISCELLANEOUS) ×2 IMPLANT
MANIFOLD NEPTUNE II (INSTRUMENTS) ×4 IMPLANT
MASK FACE SPIDER DISP (MASK) ×2 IMPLANT
MAT ABSORB  FLUID 56X50 GRAY (MISCELLANEOUS) ×1
MAT ABSORB FLUID 56X50 GRAY (MISCELLANEOUS) ×1 IMPLANT
PACK ARTHROSCOPY SHOULDER (MISCELLANEOUS) ×2 IMPLANT
PASSER SUT FIRSTPASS SELF (INSTRUMENTS) ×4 IMPLANT
SLING ULTRA II LG (MISCELLANEOUS) ×4 IMPLANT
SPONGE T-LAP 18X18 ~~LOC~~+RFID (SPONGE) ×2 IMPLANT
STAPLER SKIN PROX 35W (STAPLE) ×2 IMPLANT
STRAP SAFETY 5IN WIDE (MISCELLANEOUS) ×2 IMPLANT
SUT ETHIBOND 0 MO6 C/R (SUTURE) ×2 IMPLANT
SUT ULTRABRAID 2 COBRAID 38 (SUTURE) ×12 IMPLANT
SUT VIC AB 2-0 CT1 27 (SUTURE) ×4
SUT VIC AB 2-0 CT1 TAPERPNT 27 (SUTURE) ×2 IMPLANT
TAPE MICROFOAM 4IN (TAPE) ×2 IMPLANT
TISSUE ARTHOFLEX THICK 3MM (Tissue) ×2 IMPLANT
TUBING INFLOW SET DBFLO PUMP (TUBING) ×2 IMPLANT
WAND WEREWOLF FLOW 90D (MISCELLANEOUS) ×2 IMPLANT
WATER STERILE IRR 500ML POUR (IV SOLUTION) ×2 IMPLANT

## 2020-11-08 NOTE — Anesthesia Procedure Notes (Signed)
Anesthesia Regional Block: Interscalene brachial plexus block   Pre-Anesthetic Checklist: , timeout performed,  Correct Patient, Correct Site, Correct Laterality,  Correct Procedure, Correct Position, site marked,  Risks and benefits discussed,  Surgical consent,  Pre-op evaluation,  At surgeon's request and post-op pain management  Laterality: Upper and Right  Prep: chloraprep       Needles:  Injection technique: Single-shot  Needle Type: Stimiplex     Needle Length: 5cm  Needle Gauge: 22     Additional Needles:   Procedures:,,,, ultrasound used (permanent image in chart),,    Narrative:  Start time: 11/08/2020 12:34 PM End time: 11/08/2020 12:36 PM Injection made incrementally with aspirations every 5 mL.  Performed by: Personally  Anesthesiologist: Allyah Heather, Cleda Mccreedy, MD  Additional Notes: Patient consented for risk and benefits of nerve block including but not limited to nerve damage, failed block, bleeding and infection.  Patient voiced understanding.  Functioning IV was confirmed and monitors were applied.  Timeout done prior to procedure and prior to any sedation being given to the patient.  Patient confirmed procedure site prior to any sedation given to the patient.  A 30mm 22ga Stimuplex needle was used. Sterile prep,hand hygiene and sterile gloves were used.  Minimal sedation used for procedure.  No paresthesia endorsed by patient during the procedure.  Negative aspiration and negative test dose prior to incremental administration of local anesthetic. The patient tolerated the procedure well with no immediate complications.

## 2020-11-08 NOTE — Op Note (Signed)
11/08/2020  4:11 PM  Patient:   Leslie Carrillo  Pre-Op Diagnosis:   Large rotator cuff with biceps tendinopathy, right shoulder.  Post-Op Diagnosis:   Massive rotator cuff tear with labral fraying and chronically torn and retracted biceps tendon, right shoulder.  Procedure:   Extensive arthroscopic debridement, arthroscopic repair of partial-thickness subscapularis tendon tear, arthroscopic subacromial decompression, and mini-open rotator cuff repair with human dermal allograft patch, right shoulder.  Anesthesia:   General endotracheal with interscalene block using Exparel placed preoperatively by the anesthesiologist.  Surgeon:   Maryagnes Amos, MD  Assistant:   Horris Latino, PA-C  Findings:   As above.  There was a large irreparable rotator cuff tear involving the entire supraspinatus and much of the infraspinatus tendons retracted back to the glenoid rim that was only able to be incompletely mobilized.  The teres minor was in satisfactory condition.  There was partial tearing of the superior insertional fibers of the subscapularis tendon as well.  The biceps tendon had chronically torn and retracted distally.  The superior portion of the labrum was frayed, but not detached from the glenoid rim.  The articular surfaces of the glenoid and humerus both were in satisfactory condition.  Complications:   None  Fluids:   1100 cc  Estimated blood loss:   15 cc  Tourniquet time:   None  Drains:   None  Closure:   Staples      Brief clinical note:   The patient is a 60 year old female with a long history of gradually worsening right shoulder pain. The patient's symptoms have progressed despite medications, activity modification, etc. The patient's history and examination are consistent with impingement/tendinopathy with a rotator cuff tear. These findings were confirmed by MRI scan. The patient presents at this time for definitive management of these shoulder symptoms.  Procedure:   The  patient underwent placement of an interscalene block using Exparel by the anesthesiologist in the preoperative holding area before being brought into the operating room and lain in the supine position. The patient then underwent general endotracheal intubation and anesthesia before being repositioned in the beach chair position using the beach chair positioner. The right shoulder and upper extremity were prepped with ChloraPrep solution before being draped sterilely. Preoperative antibiotics were administered. A timeout was performed to confirm the proper surgical site before the expected portal sites and incision site were injected with 0.5% Sensorcaine with epinephrine.   A posterior portal was created and the glenohumeral joint thoroughly inspected with the findings as described above. An anterior portal was created using an outside-in technique. The labrum and rotator cuff were further probed, again confirming the above-noted findings. The areas of labral fraying were debrided back to stable margins using the full-radius resector, as were areas of synovitis and the torn margins of the rotator cuff tear. The ArthroCare wand was inserted and used to obtain hemostasis as well as to "anneal" the labrum superiorly and anteriorly.   A separate superolateral portal was created to act as a working portal.  Utilizing the two anterior portals, the subscapularis tendon tear was repaired using a single Mitek BioKnotless anchor. Subsequent probing of the repair demonstrated excellent stability. The instruments were removed from the joint after suctioning the excess fluid.  The camera was repositioned through the posterior portal into the subacromial space. A separate lateral portal was created using an outside-in technique. The 3.5 mm full-radius resector was introduced and used to perform a subtotal bursectomy. The ArthroCare wand was then inserted  and used to remove the periosteal tissue off the undersurface of the  anterior third of the acromion as well as to recess the coracoacromial ligament from its attachment along the anterior and lateral margins of the acromion. The 4.0 mm acromionizing bur was introduced and used to complete the decompression by removing the undersurface of the anterior third of the acromion. The full radius resector was reintroduced to remove any residual bony debris before the ArthroCare wand was reintroduced to obtain hemostasis. The instruments were then removed from the subacromial space after suctioning the excess fluid.  An approximately 4-5 cm incision was made over the anterolateral aspect of the shoulder beginning at the anterolateral corner of the acromion and extending distally in line with the bicipital groove. This incision was carried down through the subcutaneous tissues to expose the deltoid fascia. The raphae between the anterior and middle thirds was identified and this plane developed to provide access into the subacromial space. Additional bursal tissues were debrided sharply using Metzenbaum scissors. The rotator cuff tear was readily identified. The margins were debrided sharply with a #15 blade and the exposed greater tuberosity roughened with a rongeur. Despite extensive efforts at mobilization of the supraspinatus and infraspinatus tendons, the tendon could not be brought back to the greater tuberosity footprint. Therefore, it was elected to bridge the defect with a dermal allograft. The 3.5 mm thick allograft was trimmed to the appropriate size. Five sets of #2 FiberWire sutures were placed through the remaining margin of the rotator cuff, then passed sequentially through the graft with care taken to avoid dangling the sutures. More laterally, two Smith [employer] Nephew 2.8 mm Q -Fix anchors were placed at the articular margin. Each of these sutures were passed through the more lateral portion of the graft. Next, each of the sutures that have been placed through the rotator cuff  more medially was tied securely over the graft then cut. The more lateral sutures from the Q-Fix anchors were tied securely then brought back laterally and secured using two Smith & Nephew Healicoil knotless RegeneSorb anchors to create a two-layer closure. An apparent watertight closure was obtained.  The wound was copiously irrigated with sterile saline solution before the deltoid raphae was reapproximated using 2-0 Vicryl interrupted sutures. The subcutaneous tissues were closed in two layers using 2-0 Vicryl interrupted sutures before the skin was closed using staples. The portal sites also were closed using staples. A sterile bulky dressing was applied to the shoulder before the arm was placed into a shoulder immobilizer. The patient was then awakened, extubated, and returned to the recovery room in satisfactory condition after tolerating the procedure well.

## 2020-11-08 NOTE — Anesthesia Preprocedure Evaluation (Signed)
Anesthesia Evaluation  Patient identified by MRN, date of birth, ID band Patient awake    Reviewed: Allergy & Precautions, NPO status , Patient's Chart, lab work & pertinent test results  History of Anesthesia Complications (+) PROLONGED EMERGENCE, Emergence Delirium and history of anesthetic complications  Airway Mallampati: III  TM Distance: <3 FB Neck ROM: full    Dental  (+) Chipped, Poor Dentition, Missing   Pulmonary neg shortness of breath, asthma , Current Smoker and Patient abstained from smoking.,    Pulmonary exam normal        Cardiovascular Exercise Tolerance: Good negative cardio ROS Normal cardiovascular exam     Neuro/Psych PSYCHIATRIC DISORDERS  Neuromuscular disease    GI/Hepatic negative GI ROS, Neg liver ROS,   Endo/Other  negative endocrine ROS  Renal/GU Renal disease     Musculoskeletal  (+) Arthritis ,   Abdominal   Peds  Hematology negative hematology ROS (+)   Anesthesia Other Findings Past Medical History: No date: Anemia No date: Arthritis No date: Asthma No date: Chronic kidney disease No date: Chronic pain No date: History of kidney stones  Past Surgical History: 1985: c sections; N/A No date: carpel tunnel; Right 2014: CERVICAL FUSION No date: CHOLECYSTECTOMY No date: COLONOSCOPY  BMI    Body Mass Index: 31.87 kg/m      Reproductive/Obstetrics negative OB ROS                             Anesthesia Physical Anesthesia Plan  ASA: 3  Anesthesia Plan: General ETT   Post-op Pain Management:    Induction: Intravenous  PONV Risk Score and Plan: Ondansetron, Dexamethasone, Midazolam and Treatment may vary due to age or medical condition  Airway Management Planned: Oral ETT  Additional Equipment:   Intra-op Plan:   Post-operative Plan: Extubation in OR  Informed Consent: I have reviewed the patients History and Physical, chart, labs and  discussed the procedure including the risks, benefits and alternatives for the proposed anesthesia with the patient or authorized representative who has indicated his/her understanding and acceptance.     Dental Advisory Given  Plan Discussed with: Anesthesiologist, CRNA and Surgeon  Anesthesia Plan Comments: (Patient consented for risks of anesthesia including but not limited to:  - adverse reactions to medications - damage to eyes, teeth, lips or other oral mucosa - nerve damage due to positioning  - sore throat or hoarseness - Damage to heart, brain, nerves, lungs, other parts of body or loss of life  Patient voiced understanding.)        Anesthesia Quick Evaluation

## 2020-11-08 NOTE — Anesthesia Procedure Notes (Signed)
Procedure Name: Intubation Date/Time: 11/08/2020 1:32 PM Performed by: Gayland Curry, CRNA Pre-anesthesia Checklist: Patient identified, Emergency Drugs available, Suction available and Patient being monitored Patient Re-evaluated:Patient Re-evaluated prior to induction Oxygen Delivery Method: Circle system utilized Preoxygenation: Pre-oxygenation with 100% oxygen Induction Type: IV induction Ventilation: Mask ventilation without difficulty Laryngoscope Size: Mac and 3 Grade View: Grade I Tube type: Oral Tube size: 7.0 mm Number of attempts: 1 Placement Confirmation: ETT inserted through vocal cords under direct vision, positive ETCO2 and breath sounds checked- equal and bilateral Secured at: 19 cm Tube secured with: Tape Dental Injury: Teeth and Oropharynx as per pre-operative assessment

## 2020-11-08 NOTE — Discharge Instructions (Addendum)
Orthopedic discharge instructions: Keep dressing dry and intact.  May shower once nerve block has worn off and after dressing changed on post-op day #4 (Saturday).  Cover staples with Band-Aids after drying off. Apply ice frequently to shoulder. Take oxycodone as prescribed when needed.  May supplement with ES Tylenol if necessary. Keep shoulder immobilizer on at all times except may remove for bathing purposes. Follow-up in 10-14 days or as scheduled.      Interscalene Nerve Block with Exparel   For your surgery you have received an Interscalene Nerve Block with Exparel. Nerve Blocks affect many types of nerves, including nerves that control movement, pain and normal sensation.  You may experience feelings such as numbness, tingling, heaviness, weakness or the inability to move your arm or the feeling or sensation that your arm has "fallen asleep". A nerve block with Exparel can last up to 5 days.  Usually the weakness wears off first.  The tingling and heaviness usually wear off next.  Finally you may start to notice pain.  Keep in mind that this may occur in any order.  Once a nerve block starts to wear off it is usually completely gone within 60 minutes. ISNB may cause mild shortness of breath, a hoarse voice, blurry vision, unequal pupils, or drooping of the face on the same side as the nerve block.  These symptoms will usually resolve with the numbness.  Very rarely the procedure itself can cause mild seizures. If needed, your surgeon will give you a prescription for pain medication.  It will take about 60 minutes for the oral pain medication to become fully effective.  So, it is recommended that you start taking this medication before the nerve block first begins to wear off, or when you first begin to feel discomfort. Take your pain medication only as prescribed.  Pain medication can cause sedation and decrease your breathing if you take more than you need for the level of pain that you  have. Nausea is a common side effect of many pain medications.  You may want to eat something before taking your pain medicine to prevent nausea. After an Interscalene nerve block, you cannot feel pain, pressure or extremes in temperature in the effected arm.  Because your arm is numb it is at an increased risk for injury.  To decrease the possibility of injury, please practice the following:  While you are awake change the position of your arm frequently to prevent too much pressure on any one area for prolonged periods of time.  If you have a cast or tight dressing, check the color or your fingers every couple of hours.  Call your surgeon with the appearance of any discoloration (white or blue). If you are given a sling to wear before you go home, please wear it  at all times until the block has completely worn off.  Do not get up at night without your sling. Please contact [facility] or your surgeon if you do not begin to regain sensation after 7 days from the surgery.  Anesthesia may be contacted by calling the Same Day Surgery Department, Mon. through Fri., 6 am to 4 pm at [phone].   If you experience any other problems or concerns, please contact your surgeon's office. If you experience severe or prolonged shortness of breath go to the nearest emergency department.    AMBULATORY SURGERY  DISCHARGE INSTRUCTIONS   The drugs that you were given will stay in your system until tomorrow so for  the next 24 hours you should not:  Drive an automobile Make any legal decisions Drink any alcoholic beverage   You may resume regular meals tomorrow.  Today it is better to start with liquids and gradually work up to solid foods.  You may eat anything you prefer, but it is better to start with liquids, then soup and crackers, and gradually work up to solid foods.   Please notify your doctor immediately if you have any unusual bleeding, trouble breathing, redness and pain at the surgery  site, drainage, fever, or pain not relieved by medication.    Additional Instructions:  Please contact your physician with any problems or Same Day Surgery at [phone], Monday through Friday 6 am to 4 pm, or [place] at [facility] number at [phone].

## 2020-11-08 NOTE — H&P (Signed)
History of Present Illness: Leslie Carrillo is a 60 y.o. female who presents today physical for her upcoming right shoulder arthroscopy with debridement, decompression, repair of right rotator cuff tear. The patient is scheduled for surgery with Dr. Joice Lofts on 11/08/2020. The patient denies any changes in her medical history since she was last evaluated. She denies any falls or trauma affecting the right shoulder since she was last evaluated. The patient reports a 4 out of 10 pain score in the right shoulder. The patient does take chronic pain medication including a Butrans patch, she has stopped this medication for now. She denies any personal history of heart attack, stroke, asthma or COPD. No personal history of blood clots.  Past Medical History:  Anxiety At times   Arrhythmia 2014 (Sinus arrhythmia, PAC's & PVC's noted on Holter)   Childhood asthma, unspecified   Chronic low back pain 2013   Chronic pain of left knee 2016   DDD (degenerative disc disease), cervical 2013   DDD (degenerative disc disease), lumbar - Multilevel   DDD (degenerative disc disease), thoracic 2013   Encounter for blood transfusion 2008 (Bleeding event)   GERD (gastroesophageal reflux disease)   Gestational diabetes mellitus 1989   Heart murmur, unspecified 1976   Hiatal hernia   Hyperlipidemia (Intolerant to statins)   Hypertension   Impaired glucose tolerance 09/17/2016   Iron deficiency anemia 09/10/2016 - Prior h/o of IDA, prior followed by [facility], family h/o HHT. Normal iron studies in 2018.   Leukocytosis 04/28/2014 (Eval'd by [facility] & felt to be r/t smoking & OA)   Restless leg syndrome 08/26/2015   Scoliosis in both lumbar & thoracic spine.   Seasonal allergic rhinitis   Past Surgical History:  CESAREAN SECTION   CHOLECYSTECTOMY   ENDOSCOPIC CARPAL TUNNEL RELEASE Right 2012 (Dr. Rosita Kea)   POSTERIOR FUSION CERVICAL SPINE 2013-2014 (Dr. Gerlene Fee. C5-6 & C6-7 anterior & interbody fusion w/ hardware)    UNLISTED LAPAROSCOPY PROCEDURE UTERUS (for blood vessel bleed in uterine)   Past Family History:  Heart disease Mother   Other Father (osler weber rendu, HHT)   Heart disease Father   Other Brother (HHT)   Other Paternal Uncle   Medications:  acetaminophen (TYLENOL) 500 MG tablet Take by mouth Take 1,000 mg by mouth every 8 (eight) hours as needed for moderate pain.   buprenorphine (BUTRANS) 15 mcg/hour Place 1 patch onto the skin every 7 (seven) days   diazePAM (VALIUM) 2 MG tablet Take 2 mg by mouth as needed   methocarbamoL (ROBAXIN) 500 MG tablet Take by mouth Take 1 tablet (500 mg total) by mouth 2 (two) times daily as needed for muscle spasms.   oxyCODONE-acetaminophen (PERCOCET) 5-325 mg tablet Take 1-2 tablets by mouth every 4 (four) hours as needed for severe pain.   sertraline (ZOLOFT) 100 MG tablet Take 1 tablet (100 mg total) by mouth once daily 90 tablet 1   methocarbamoL (ROBAXIN) 500 MG tablet 1/2-1 po qHS prn (Patient not taking: Reported on 11/02/2020) 30 tablet 5   Allergies:  Codeine Anaphylaxis and Nausea And Vomiting (When taken in cough syrup)   Mycinette [Cetylpyridinium-Benzocaine] Anaphylaxis   Pregabalin Palpitations (Rapid heart beat)   Hydrocodone-Acetaminophen Rash (Rashes all over her body)   Tramadol Rash (At high dosage)   Chicken Flavor (Bulk) Nausea   Lovastatin Other ("I just feel ill")   Pork/Porcine Containing Products Nausea   Simvastatin Other ("I just feel ill")   Review of Systems:  A comprehensive 14 point  ROS was performed, reviewed by me today, and the pertinent orthopaedic findings are documented in the HPI.  Physical Exam: BP 130/80  Ht 160 cm (5' 2.99")  Wt 81.6 kg (179 lb 12.8 oz)  BMI 31.86 kg/m  General/Constitutional: The patient appears to be well-nourished, well-developed, and in no acute distress. Neuro/Psych: Normal mood and affect, oriented to person, place and time. Eyes: Non-icteric. Pupils are equal, round, and reactive  to light, and exhibit synchronous movement. ENT: Unremarkable. Lymphatic: No palpable adenopathy. Respiratory: Lungs clear to auscultation, Normal chest excursion, No wheezes and Non-labored breathing Cardiovascular: Regular rate and rhythm. No murmurs. and No edema, swelling or tenderness, except as noted in detailed exam. Integumentary: No impressive skin lesions present, except as noted in detailed exam. Musculoskeletal: Unremarkable, except as noted in detailed exam.  Right shoulder exam: SKIN: normal SWELLING: none WARMTH: none LYMPH NODES: no adenopathy palpable CREPITUS: none TENDERNESS: Mildly tender over anterolateral shoulder ROM (active):  Forward flexion: 155 Abduction: 150 Internal rotation: L1 ROM (passive):  Forward flexion: 160 Abduction: 155 ER/IR at 90 abd: 85 / 50   She has mild-moderate pain at the extremes of all motions.   STRENGTH: Forward flexion: 4/5 Abduction: 4/5 External rotation: 4/5 Internal rotation: 4-4+/5 Pain with RC testing: Mild-moderate pain with resisted abduction more so than with resisted forward flexion   STABILITY: Normal   SPECIAL TESTS: Juanetta Gosling' test: positive, mild Speed's test: negative Capsulitis - pain w/ passive ER: Negative Crossed arm test: Minimally positive Crank: Not evaluated Anterior apprehension: Negative Posterior apprehension: Not evaluated   Cervical Spine:  Supple, non-tender.   ROM: Flexion: 40 Extension: 30 Left/Right turn: 65 / 65 Left/Right tilt: 25 / 25   She has mild pain with extension, left turn and right turn. Neck motion does not reproduce the patient's shoulder symptoms. She has a negative Spurling's test bilaterally. She is neurovascularly intact to the right upper extremity.   Right Shoulder Imaging, MRI: MRI Shoulder Cartilage: No cartilage abnormality. MRI Shoulder Rotator Cuff: Full thickness tear of the supraspinatus. Retracted to the humeral head. Moderate tendinopathy of  supraspinatus, infraspinatus, and subscapularis tendons. MRI Shoulder Labrum / Biceps: SLAP tear. Long head of biceps appears to be chronically torn and retracted distally.  Impression: 1. Injury of tendon of long head of right biceps. 2. Nontraumatic complete tear of right rotator cuff. 3. Rotator cuff tendinitis, right.  Plan:  1. Treatment options were discussed today with the patient. 2. She is scheduled for a right shoulder arthroscopy with debridement, decompression and repair of right rotator cuff tear. Surgery scheduled with Dr. Joice Lofts on 11/08/2020. 3. The patient was informed on the risk and benefits of surgery and wishes to proceed at this time. Plan will be to prescribe oxycodone 5 mg 1-2 tablets as needed for pain every 4 hours for acute pain control. 4. This document will serve as a surgical history and physical for the patient. 5. The patient will follow-up per standard postop protocol. They can call the clinic they have any questions, new symptoms develop or symptoms worsen.  The procedure was discussed with the patient, as were the potential risks (including bleeding, infection, nerve and/or blood vessel injury, persistent or recurrent pain, failure of the repair, progression of arthritis, need for further surgery, blood clots, strokes, heart attacks and/or arhythmias, pneumonia, etc.) and benefits. The patient states her understanding and wishes to proceed.   H&P reviewed and patient re-examined. No changes.

## 2020-11-08 NOTE — Transfer of Care (Signed)
Immediate Anesthesia Transfer of Care Note  Patient: Leslie Carrillo  Procedure(s) Performed: RIGHT SHOULDER ARTHROSCOPY WITH DEBRIDEMENT, DECOMPRESSION, ARTHROSCOPIC ROTATOR CUFF REPAIR, MINI OPEN ROTATOR CUFF REPAIR WITH DERMAL ALLOGRAFT (Right: Shoulder)  Patient Location: PACU  Anesthesia Type:General  Level of Consciousness: awake, alert , oriented and patient cooperative  Airway & Oxygen Therapy: Patient Spontanous Breathing and Patient connected to nasal cannula oxygen  Post-op Assessment: Report given to RN and Post -op Vital signs reviewed and stable  Post vital signs: Reviewed and stable  Last Vitals:  Vitals Value Taken Time  BP 143/57 11/08/20 1547  Temp    Pulse 64 11/08/20 1552  Resp 17 11/08/20 1552  SpO2 100 % 11/08/20 1552  Vitals shown include unvalidated device data.  Last Pain:  Vitals:   11/08/20 1131  TempSrc: Temporal  PainSc: 5          Complications: No notable events documented.

## 2020-11-08 NOTE — ED Triage Notes (Addendum)
Pt arrived via POV with reports of shortness of breath and chest pain after arthroscopy of R shoulder.  Pt c/o central chest pain, pt states the pain radiates to the back.  Pt states the pain and shortness of breath started shortly after leaving hospital from surgery.  Pt taking oxycodone for the pain - pt took 1 dose at 720pm. Pt also took 1 tylenol at 830pm  Pt states she was supposed to be given a spirometer after surgery but was not

## 2020-11-08 NOTE — Anesthesia Postprocedure Evaluation (Signed)
Anesthesia Post Note  Patient: Leslie Carrillo  Procedure(s) Performed: RIGHT SHOULDER ARTHROSCOPY WITH DEBRIDEMENT, DECOMPRESSION, ARTHROSCOPIC ROTATOR CUFF REPAIR, MINI OPEN ROTATOR CUFF REPAIR WITH DERMAL ALLOGRAFT (Right: Shoulder)  Patient location during evaluation: PACU Anesthesia Type: General Level of consciousness: awake and alert, awake and oriented Pain management: pain level controlled Vital Signs Assessment: post-procedure vital signs reviewed and stable Respiratory status: spontaneous breathing, nonlabored ventilation and respiratory function stable Cardiovascular status: blood pressure returned to baseline and stable Postop Assessment: no apparent nausea or vomiting Anesthetic complications: no   No notable events documented.   Last Vitals:  Vitals:   11/08/20 1615 11/08/20 1623  BP: 127/68   Pulse: 62 62  Resp: 15 20  Temp:  (!) 36.1 C  SpO2: 98% 97%    Last Pain:  Vitals:   11/08/20 1623  TempSrc:   PainSc: 0-No pain                 Manfred Arch

## 2020-11-09 ENCOUNTER — Emergency Department
Admission: EM | Admit: 2020-11-09 | Discharge: 2020-11-09 | Disposition: A | Discharge status: Home / Self Care | Payer: Medicare Other | Source: Home / Self Care

## 2020-11-09 ENCOUNTER — Encounter: Payer: Self-pay | Admitting: Surgery

## 2020-11-09 DIAGNOSIS — R079 Chest pain, unspecified: Secondary | ICD-10-CM

## 2020-11-16 ENCOUNTER — Encounter: Payer: Self-pay | Admitting: Surgery

## 2020-11-24 ENCOUNTER — Ambulatory Visit
Payer: Medicare Other | Attending: Student in an Organized Health Care Education/Training Program | Admitting: Student in an Organized Health Care Education/Training Program

## 2020-11-24 ENCOUNTER — Other Ambulatory Visit: Payer: Self-pay

## 2020-11-24 ENCOUNTER — Encounter: Payer: Self-pay | Admitting: Student in an Organized Health Care Education/Training Program

## 2020-11-24 VITALS — BP 139/101 | HR 78 | Temp 97.2°F | Resp 16 | Ht 63.0 in | Wt 175.0 lb

## 2020-11-24 DIAGNOSIS — G894 Chronic pain syndrome: Secondary | ICD-10-CM | POA: Insufficient documentation

## 2020-11-24 DIAGNOSIS — M7581 Other shoulder lesions, right shoulder: Secondary | ICD-10-CM | POA: Diagnosis present

## 2020-11-24 DIAGNOSIS — M47816 Spondylosis without myelopathy or radiculopathy, lumbar region: Secondary | ICD-10-CM | POA: Insufficient documentation

## 2020-11-24 DIAGNOSIS — M75101 Unspecified rotator cuff tear or rupture of right shoulder, not specified as traumatic: Secondary | ICD-10-CM | POA: Insufficient documentation

## 2020-11-24 DIAGNOSIS — M7591 Shoulder lesion, unspecified, right shoulder: Secondary | ICD-10-CM | POA: Diagnosis present

## 2020-11-24 DIAGNOSIS — M12811 Other specific arthropathies, not elsewhere classified, right shoulder: Secondary | ICD-10-CM | POA: Insufficient documentation

## 2020-11-24 MED ORDER — BUPRENORPHINE 15 MCG/HR TD PTWK: 1.0000 | MEDICATED_PATCH | TRANSDERMAL | 2 refills | Status: AC

## 2020-11-24 NOTE — Progress Notes (Signed)
Nursing Pain Medication Assessment:  Safety precautions to be maintained throughout the outpatient stay will include: orient to surroundings, keep bed in low position, maintain call bell within reach at all times, provide assistance with transfer out of bed and ambulation.  Medication Inspection Compliance: Pill count conducted under aseptic conditions, in front of the patient. Neither the pills nor the bottle was removed from the patient's sight at any time. Once count was completed pills were immediately returned to the patient in their original bottle.  Medication: Oxycodone IR Pill/Patch Count:  12 of 30 pills remain Pill/Patch Appearance: Markings consistent with prescribed medication Bottle Appearance: Standard pharmacy container. Clearly labeled. Filled Date: 8 / 29 / 2022 Last Medication intake:  Today These given to patient from Dr Joice Lofts from surgery  Buprenorphine Patches 3/4 remain Filled 10/20/20   was told to stop before surgery.

## 2020-11-24 NOTE — Progress Notes (Signed)
PROVIDER NOTE: Information contained herein reflects review and annotations entered in association with encounter. Interpretation of such information and data should be left to medically-trained personnel. Information provided to patient can be located elsewhere in the medical record under "Patient Instructions". Document created using STT-dictation technology, any transcriptional errors that may result from process are unintentional.    Patient: Leslie Carrillo  Service Category: E/M  Provider: Edward Jolly, MD  DOB: 1960-12-27  DOS: 11/24/2020  Specialty: Interventional Pain Management  MRN: [id number]  Setting: Ambulatory outpatient  PCP: Leslie Nurse, MD  Type: Established Patient    Referring Provider: Gracelyn Nurse, MD  Location: Office  Delivery: Face-to-face     HPI  [name], a 60 y.o. year old female, is here today because of her Right rotator cuff tear arthropathy [M75.101, M12.811]. [name] primary complain today is Back Pain (Entire back)  Last encounter: My last encounter with her was on 08/25/20  Pertinent problems: [name] has Chronic low back pain; DDD (degenerative disc disease), thoracic; Chronic radicular lumbar pain; Cervical facet joint syndrome; Bilateral primary osteoarthritis of knee; Lumbar degenerative disc disease; Chronic pain syndrome; Myofascial pain syndrome; and Lumbar spondylosis on their pertinent problem list. Pain Assessment: Severity of Chronic pain is reported as a 2 /10. Location: Back Upper, Mid, Lower/buttocks bilateral down entire legs. Onset: More than a month ago. Quality: Aching, Constant, Discomfort. Timing: Constant. Modifying factor(s): "Nothing really"  medications, change positions. Vitals:  height is 5\' 3"  (1.6 m) and weight is 175 lb (79.4 kg). Her temperature is 97.2 F (36.2 C) (abnormal). Her blood pressure is 139/101 (abnormal) and her pulse is 78. Her respiration is 16 and oxygen saturation is 98%.   Reason for encounter:  medication management.    Leslie Carrillo follows up today for medication management.  She is status post right shoulder arthroscopy on 06/08/2020.  She is recovering from the surgery is almost 2 weeks out.  She has been working with physical therapy postoperatively.  She has been utilizing oxycodone prescribed by her orthopedic team to help manage her postoperative pain.  She states that her acute postoperative pain is starting to improve.  I think it is reasonable for her to restart her buprenorphine transdermal pain patch which should help with chronic pain management.  She can continue oxycodone as prescribed by her orthopedic team.  I informed her that should she need any refills of oxycodone within the first 3 months of her surgery, she needs to reach out to her orthopedic team for that as she was prescribed for acute postsurgical pain.  Otherwise I have encouraged her to continue with physical therapy.  She is starting to engage in shoulder ABduction as well as shoulder flexion and extension.  She states that she has been having increased fatigue.  She has hematuria which her primary care provider and her urologist are aware of.  She states that she is at a previous cystoscopy.  I recommended that she follow back up with urology of her primary care provider to address this.  Pharmacotherapy Assessment  Analgesic: Buprenorphine transdermal patch, 15 mcg an hour, oxycodone 5 mg daily as needed for breakthrough pain, quantity #30 to last for 90 days MME/day: 10 mg/day   Monitoring: [place] PMP: PDMP reviewed during this encounter.       Pharmacotherapy: No side-effects or adverse reactions reported. Compliance: No problems identified. Effectiveness: Clinically acceptable.  UDS:  Summary  Date Value Ref Range Status  03/03/2020 Note  Final    Comment:    ==================================================================== [employer] [facility] 13  (MW) ==================================================================== Test                             Result       Flag       Units  Drug Present and Declared for Prescription Verification   Buprenorphine                  15           EXPECTED   ng/mg creat   Norbuprenorphine               37           EXPECTED   ng/mg creat    Source of buprenorphine is a scheduled prescription medication.    Norbuprenorphine is an expected metabolite of buprenorphine.  Drug Absent but Declared for Prescription Verification   Oxycodone                      Not Detected UNEXPECTED ng/mg creat ==================================================================== Test                      Result    Flag   Units      Ref Range   Creatinine              124              mg/dL      >=20 ==================================================================== Declared Medications:  The flagging and interpretation on this report are based on the  following declared medications.  Unexpected results may arise from  inaccuracies in the declared medications.   **Note: The testing scope of this panel includes these medications:   Oxycodone (Percocet)   **Note: The testing scope of this panel does not include small to  moderate amounts of these reported medications:   Buprenorphine Patch (BuTrans)   **Note: The testing scope of this panel does not include the  following reported medications:   Acetaminophen (Percocet)  Hydroxyzine (Vistaril)  Methocarbamol (Robaxin)  Sertraline (Zoloft)  Triamcinolone (Kenalog) ==================================================================== For clinical consultation, please call [phone]. ====================================================================       ROS  Constitutional: Denies any fever or chills Gastrointestinal: No reported hemesis, hematochezia, vomiting, or acute GI distress Musculoskeletal:  Right shoulder and rotator cuff  pain Neurological: No reported episodes of acute onset apraxia, aphasia, dysarthria, agnosia, amnesia, paralysis, loss of coordination, or loss of consciousness  Medication Review  acetaminophen, buprenorphine, and sertraline  History Review  Allergy: [name] is allergic to codeine, pregabalin, hydrocodone-acetaminophen, tramadol, latex, lovastatin, and simvastatin. Drug: [name]  reports no history of drug use. Alcohol:  reports no history of alcohol use. Tobacco:  reports that she has been smoking cigarettes. She has a 10.00 pack-year smoking history. She has never used smokeless tobacco. Social: [name]  reports that she has been smoking cigarettes. She has a 10.00 pack-year smoking history. She has never used smokeless tobacco. She reports that she does not drink alcohol and does not use drugs. Medical:  has a past medical history of Anemia, Arthritis, Asthma, Chronic kidney disease, Chronic pain, and History of kidney stones. Surgical: [name]  has a past surgical history that includes Cervical fusion (2014); carpel tunnel (Right); Cholecystectomy; c sections (N/A, 1985); Colonoscopy; and Shoulder arthroscopy with subacromial decompression, rotator cuff repair and bicep tendon repair (Right, 11/08/2020). Family:  family history includes Dementia in her mother; Prostate cancer in her father; Renal Cyst in her father.  Laboratory Chemistry Profile   Renal Lab Results  Component Value Date   BUN 11 11/08/2020   CREATININE 0.97 11/08/2020   GFRAA >60 10/16/2019   GFRNONAA >60 11/08/2020     Hepatic Lab Results  Component Value Date   AST 20 10/16/2019   ALT 15 10/16/2019   ALBUMIN 4.0 10/16/2019   ALKPHOS 75 10/16/2019   LIPASE 29 10/16/2019     Electrolytes Lab Results  Component Value Date   NA 137 11/08/2020   K 3.9 11/08/2020   CL 102 11/08/2020   CALCIUM 9.0 11/08/2020     Bone No results found for: VD25OH, VD125OH2TOT, WP8099IP3, AS5053ZJ6, 25OHVITD1,  25OHVITD2, 25OHVITD3, TESTOFREE, TESTOSTERONE   Inflammation (CRP: Acute Phase) (ESR: Chronic Phase) No results found for: CRP, ESRSEDRATE, LATICACIDVEN     Note: Above Lab results reviewed.  Recent Imaging Review  DG Chest 1 View CLINICAL DATA:  Shortness of breath and chest pain after right shoulder arthroscopy.  EXAM: CHEST  1 VIEW  COMPARISON:  04/05/2012  FINDINGS: Shallow inspiration. Linear atelectasis in the right mid lung. No airspace disease or consolidation. No pleural effusions. No pneumothorax. Normal heart size. Postoperative changes in the lower cervical spine and right shoulder.  IMPRESSION: Shallow inspiration with linear atelectasis in the right mid lung.  Electronically Signed   By: Lucienne Capers M.D.   On: 11/08/2020 22:26 [place] OR NERVE BLOCK-IMAGE ONLY ([place]) There is no interpretation for this exam.    This order is for images obtained during a surgical procedure.  Please See  "Surgeries" Tab for more information regarding the procedure. Note: Reviewed        Physical Exam  General appearance: Well nourished, well developed, and well hydrated. In no apparent acute distress Mental status: Alert, oriented x 3 (person, place, & time)       Respiratory: No evidence of acute respiratory distress Eyes: PERLA Vitals: BP (!) 139/101 Comment: after 3 check, patient states she has had Headache and some blurred vision, MD notiified  Pulse 78   Temp (!) 97.2 F (36.2 C)   Resp 16   Ht $R'5\' 3"'qN$  (1.6 m)   Wt 175 lb (79.4 kg)   SpO2 98%   BMI 31.00 kg/m  BMI: Estimated body mass index is 31 kg/m as calculated from the following:   Height as of this encounter: $RemoveBeforeD'5\' 3"'OpfJDKdrMxhjsE$  (1.6 m).   Weight as of this encounter: 175 lb (79.4 kg). Ideal: Ideal body weight: 52.4 kg (115 lb 8.3 oz) Adjusted ideal body weight: 63.2 kg (139 lb 5 oz)   Upper Extremity (UE) Exam      Side: Right upper extremity   Side: Left upper extremity  Skin & Extremity Inspection: Right  shoulder in brace   Skin & Extremity Inspection: Skin color, temperature, and hair growth are WNL. No peripheral edema or cyanosis. No masses, redness, swelling, asymmetry, or associated skin lesions. No contractures.  Functional ROM:  Pain restricted range of motion         Functional ROM: Unrestricted ROM          Muscle Tone/Strength: Functionally intact. No obvious neuro-muscular anomalies detected.   Muscle Tone/Strength: Functionally intact. No obvious neuro-muscular anomalies detected.  Sensory (Neurological):  Arthropathic arthralgia of right glenohumeral joint         Sensory (Neurological): Unimpaired          Palpation: No  palpable anomalies               Palpation: No palpable anomalies                 Provocative Test(s):  Phalen's test: deferred Tinel's test: deferred Apley's scratch test (touch opposite shoulder):  Action 1 (Across chest): deferred Action 2 (Overhead): deferred Action 3 (LB reach): deferred        Assessment   Status Diagnosis  Persistent Persistent Persistent 1. Right rotator cuff tear arthropathy   2. Tear of right supraspinatus tendon   3. Supraspinatus tendonitis, right   4. Lumbar spondylosis   5. Lumbar facet arthropathy   6. Infraspinatus tendinitis, right   7. Chronic pain syndrome        Plan of Care   Leslie Carrillo has a current medication list which includes the following long-term medication(s): sertraline.  Pharmacotherapy (Medications Ordered): Meds ordered this encounter  Medications   buprenorphine (BUTRANS) 15 MCG/HR    Sig: Place 1 patch onto the skin once a week.    Dispense:  4 patch    Refill:  2    Chronic Pain: STOP Act (Not applicable) Fill 1 day early if closed on refill date. Avoid benzodiazepines within 8 hours of opioids    Follow-up plan:   Return in about 3 months (around 02/23/2021) for Medication Management, in person.     Has tried cervical and thoracic epidural steroid injections with physiatry at [facility].   Consider thoracic TPI.  Consider lumbar facet medial branch nerve blocks for severe facet arthropathy lower lumbar spine present on x-ray.  Could also consider knee intra-articular steroid, Hyalgan, genicular nerve block. S/p right shoulder injection 12/23/19, 02/03/20 right shoulder MRI-supraspinatus tendon tear with infraspinatus tendinopathy, has been told that she needs to have right shoulder surgery, tentative plan early August.    Recent Visits No visits were found meeting these conditions. Showing recent visits within past 90 days and meeting all other requirements Today's Visits Date Type Provider Dept  11/24/20 Office Visit Gillis Santa, MD [facility]  Showing today's visits and meeting all other requirements Future Appointments No visits were found meeting these conditions. Showing future appointments within next 90 days and meeting all other requirements I discussed the assessment and treatment plan with the patient. The patient was provided an opportunity to ask questions and all were answered. The patient agreed with the plan and demonstrated an understanding of the instructions.  Patient advised to call back or seek an in-person evaluation if the symptoms or condition worsens.  Duration of encounter:69minutes.  Note by: Gillis Santa, MD Date: 11/24/2020; Time: 1:16 PM

## 2020-11-28 ENCOUNTER — Other Ambulatory Visit: Payer: Self-pay

## 2020-11-28 ENCOUNTER — Ambulatory Visit
Admission: RE | Admit: 2020-11-28 | Discharge: 2020-11-28 | Disposition: A | Discharge status: Home / Self Care | Payer: Medicare Other | Source: Ambulatory Visit | Attending: Physician Assistant | Admitting: Physician Assistant

## 2020-11-28 ENCOUNTER — Other Ambulatory Visit: Payer: Self-pay | Admitting: Physician Assistant

## 2020-11-28 DIAGNOSIS — R1012 Left upper quadrant pain: Secondary | ICD-10-CM

## 2020-11-28 DIAGNOSIS — R109 Unspecified abdominal pain: Secondary | ICD-10-CM

## 2020-11-28 DIAGNOSIS — R110 Nausea: Secondary | ICD-10-CM

## 2020-11-28 DIAGNOSIS — R319 Hematuria, unspecified: Secondary | ICD-10-CM

## 2021-01-04 IMAGING — CR DG KNEE 1-2V*R*
1 series · 2 of 2 positions shown · non-contrast
Comparison: MRI lumbar spine 01/09/2012

CLINICAL DATA: Low back pain extending into the hips bilaterally.

EXAM:
RIGHT KNEE - 1-2 VIEW; LEFT KNEE - 1-2 VIEW; DG HIP (WITH OR WITHOUT
PELVIS) 2-3V LEFT; LUMBAR SPINE - COMPLETE WITH BENDING VIEWS; DG
HIP (WITH OR WITHOUT PELVIS) 2-3V RIGHT

[Series 1: dg knee 1-2 views right · 0.14mm/px · 2 of 2 slices shown]
[im 1/2]
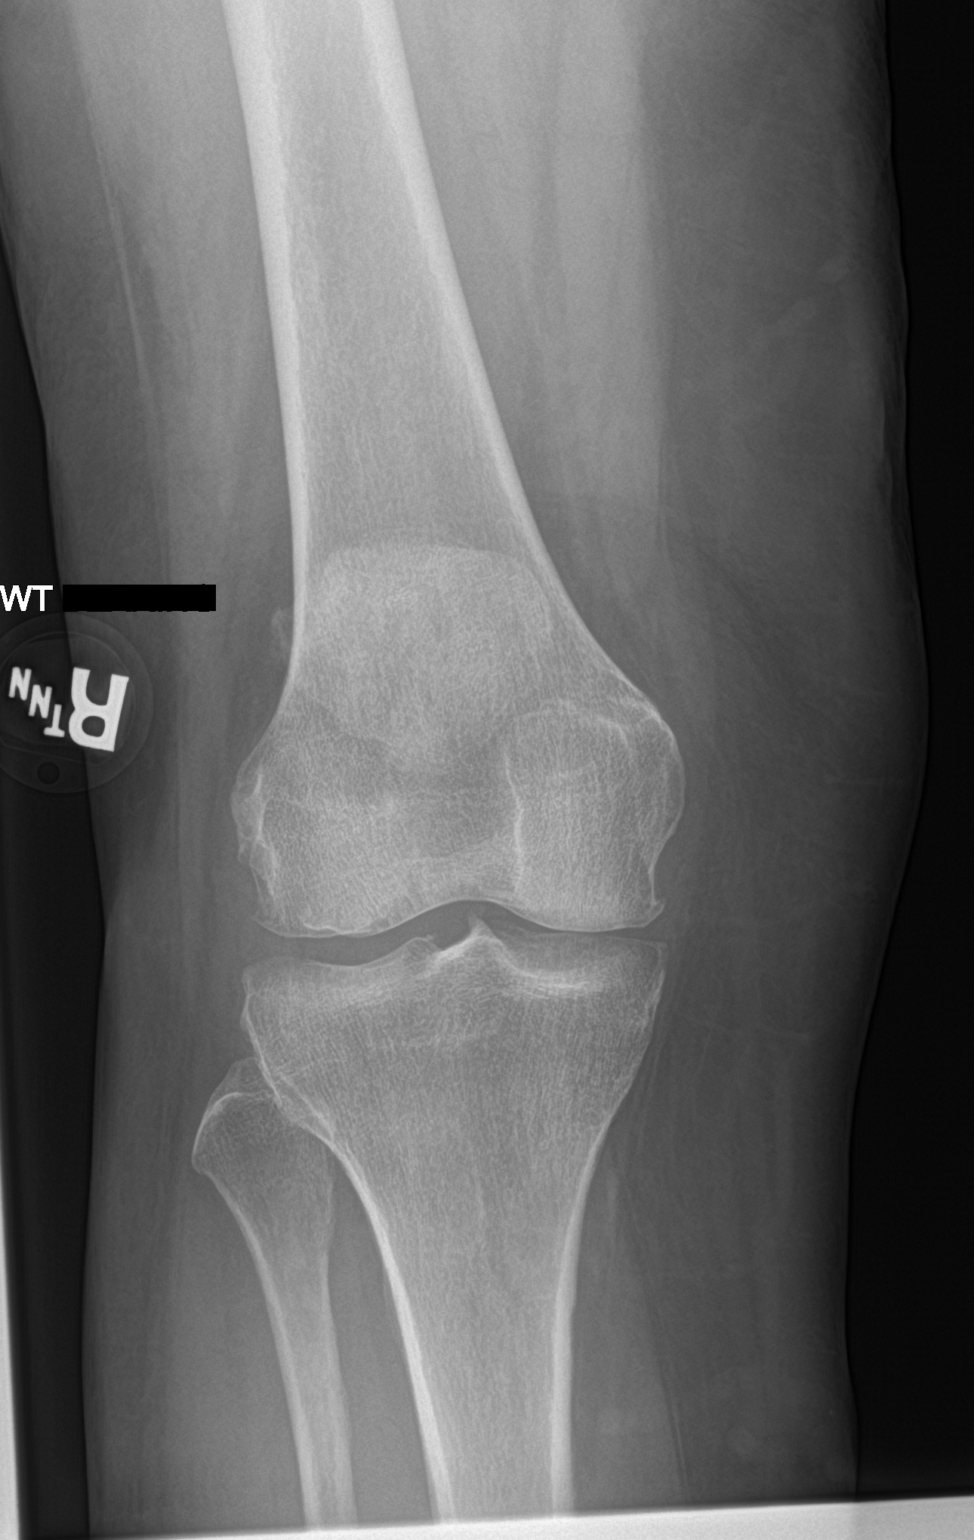
[im 2/2]
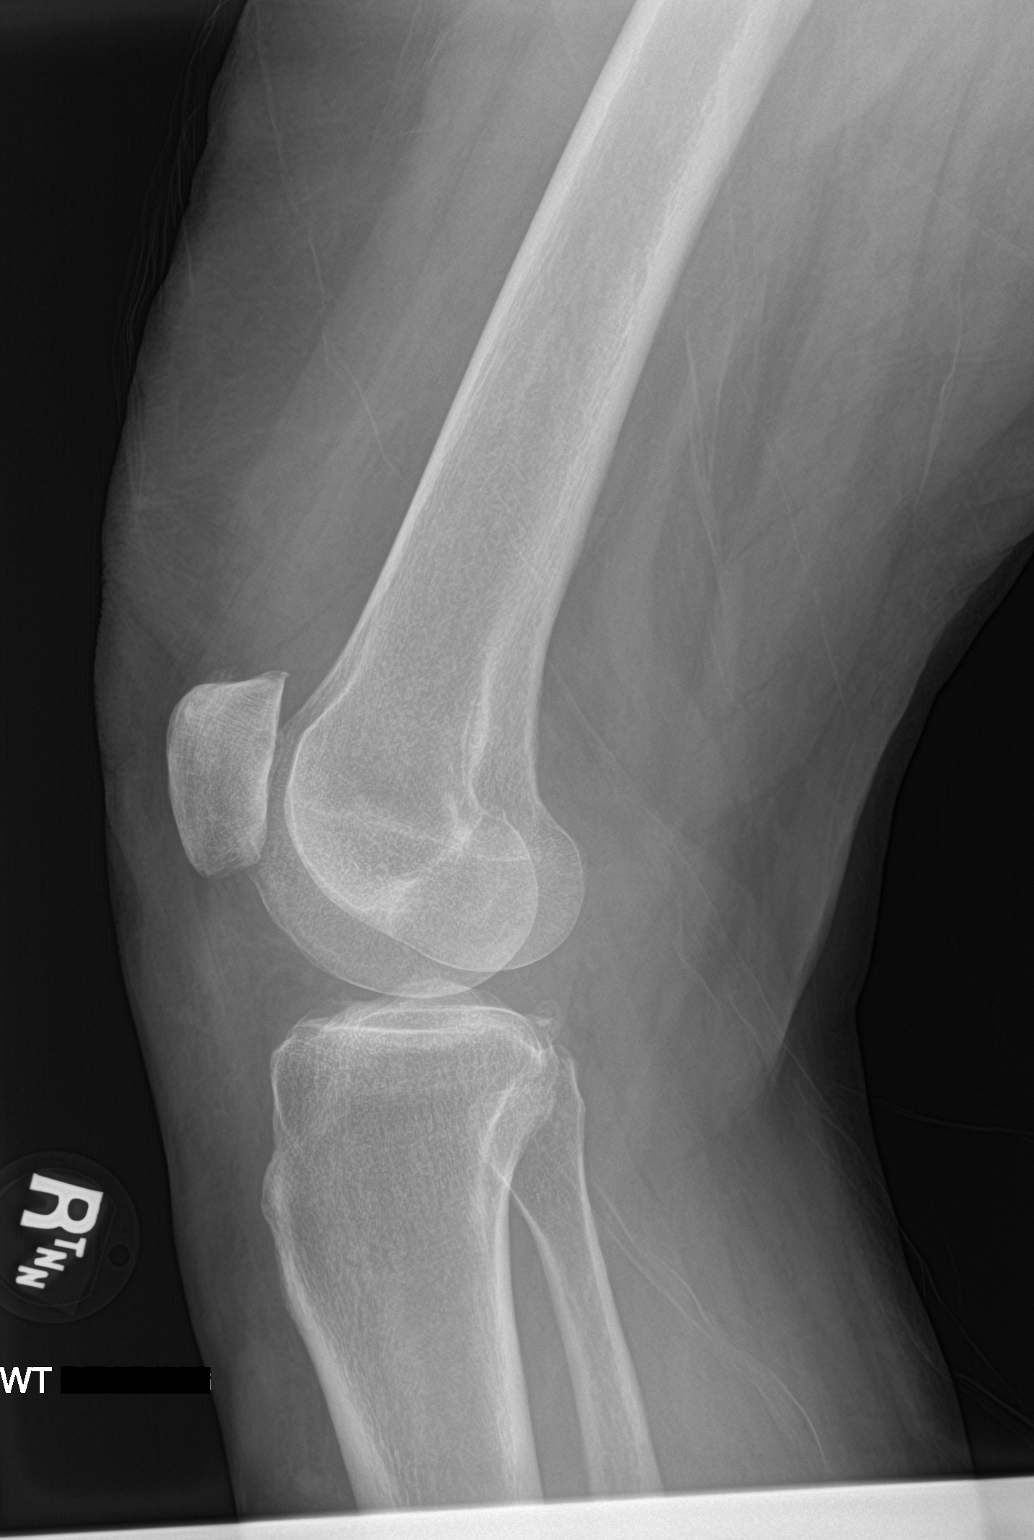

[2 of 2 positions shown; findings below may reference images not displayed]

FINDINGS: Non rib-bearing lumbar type vertebral bodies are present.
Progressive levoconvex curvature of the lumbar spine is centered at
L2-3. Compensatory rightward curvature is present at L5. Slight
retrolisthesis is present at L2-3 and L3-4. There is slight
anterolisthesis at L4-5. Marked facet hypertrophy is present from
L3-4 through L5-S1.

Atherosclerotic calcifications are present in the aorta.

There is no significant change in listhesis associated with flexion
or extension.
IMPRESSION: 1. Progressive levoconvex curvature of the lumbar spine.
2. Retrolisthesis at L3-4 and anterolisthesis at L4-5 is new.
3. Significant facet hypertrophy at L3-4 and L4-5 in particular may
contribute to radicular symptoms.

## 2021-01-04 IMAGING — CR DG SI JOINTS 3+V
1 series · 3 of 3 positions shown · non-contrast
Comparison: One-view pelvis 11/22/2011

CLINICAL DATA: Sacroiliac pain.  Chronic low back pain.

EXAM:
BILATERAL SACROILIAC JOINTS - 3+ VIEW

[Series 1: dg si joints · 0.14mm/px · 3 of 3 slices shown]
[im 1/3]
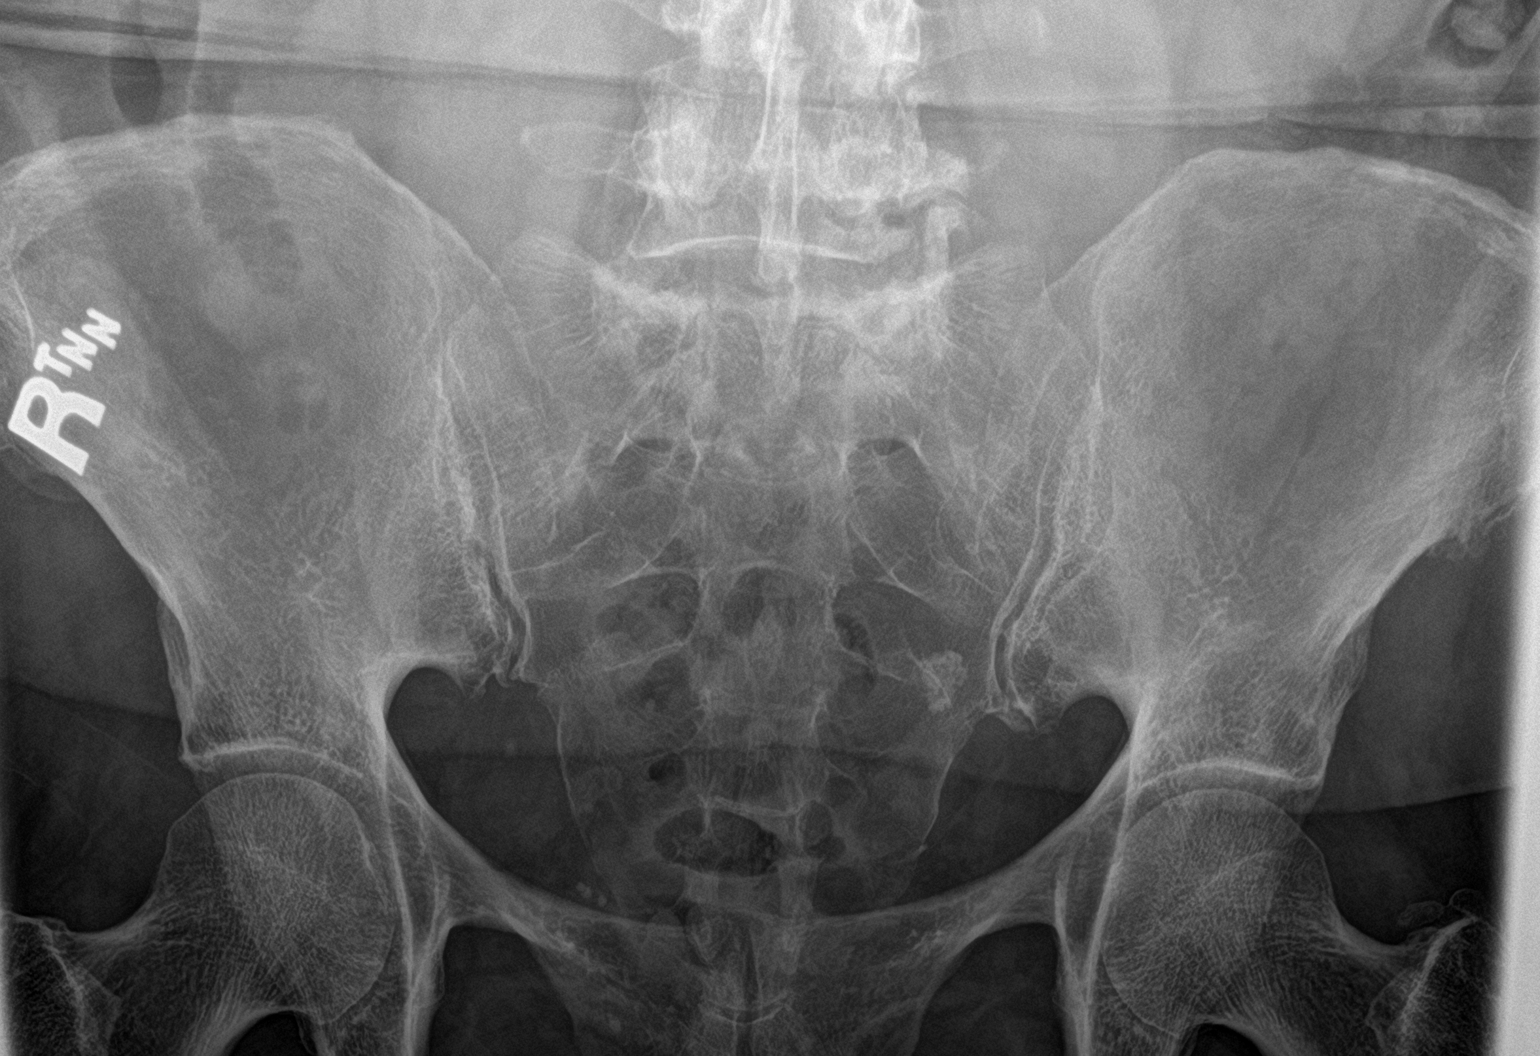
[im 2/3]
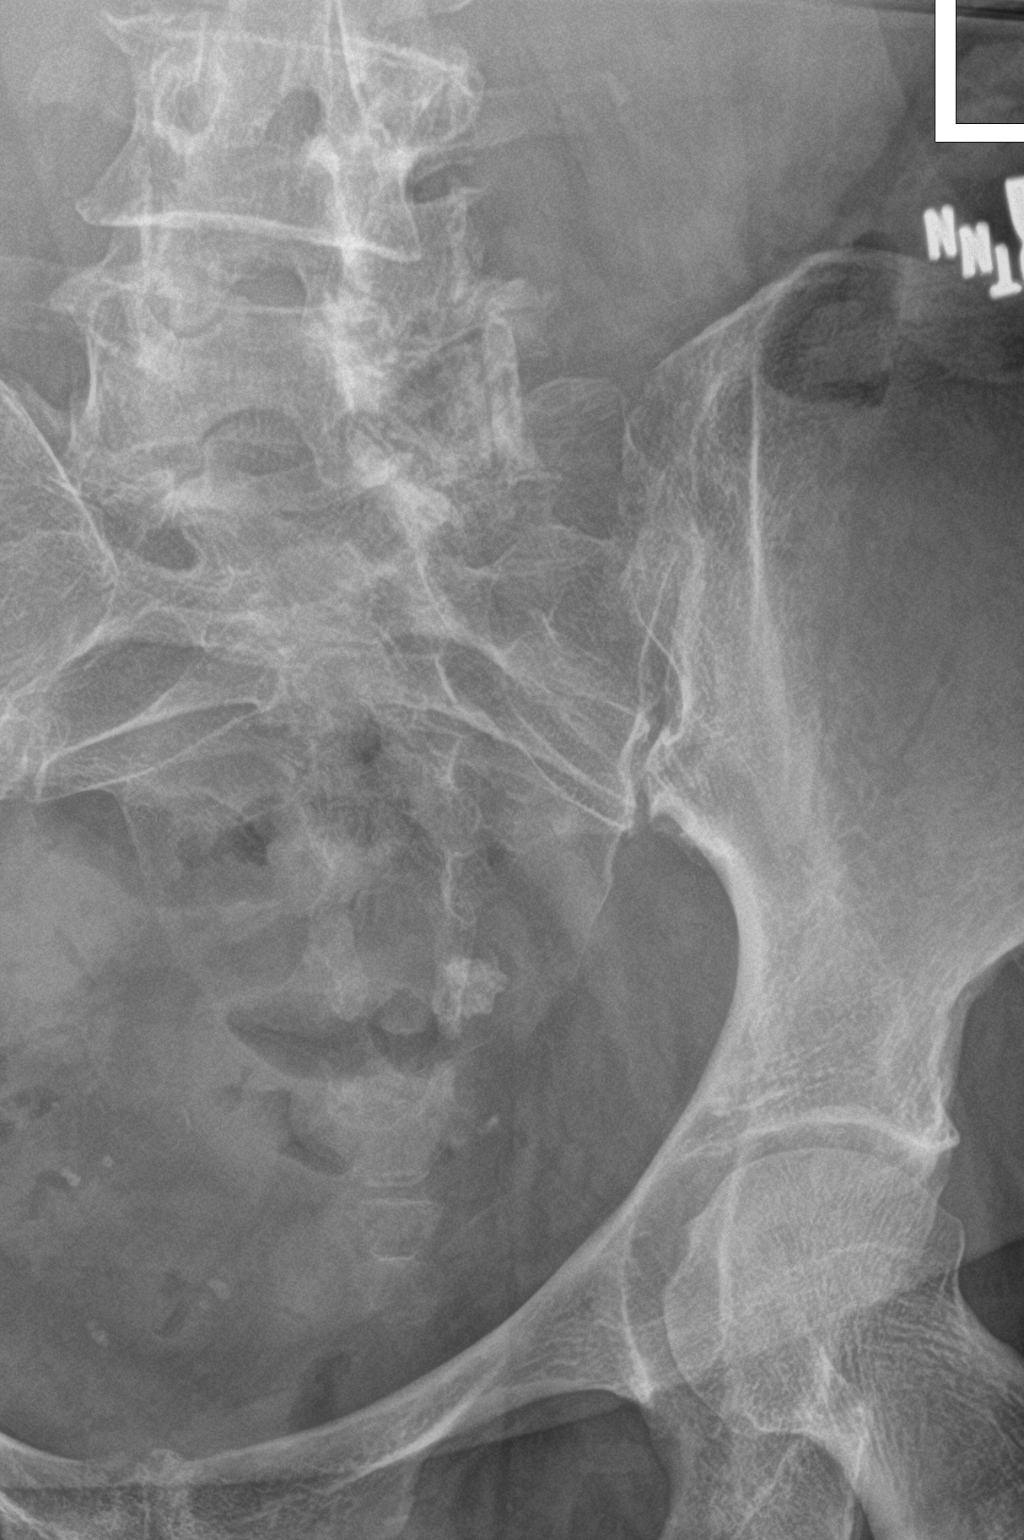
[im 3/3]
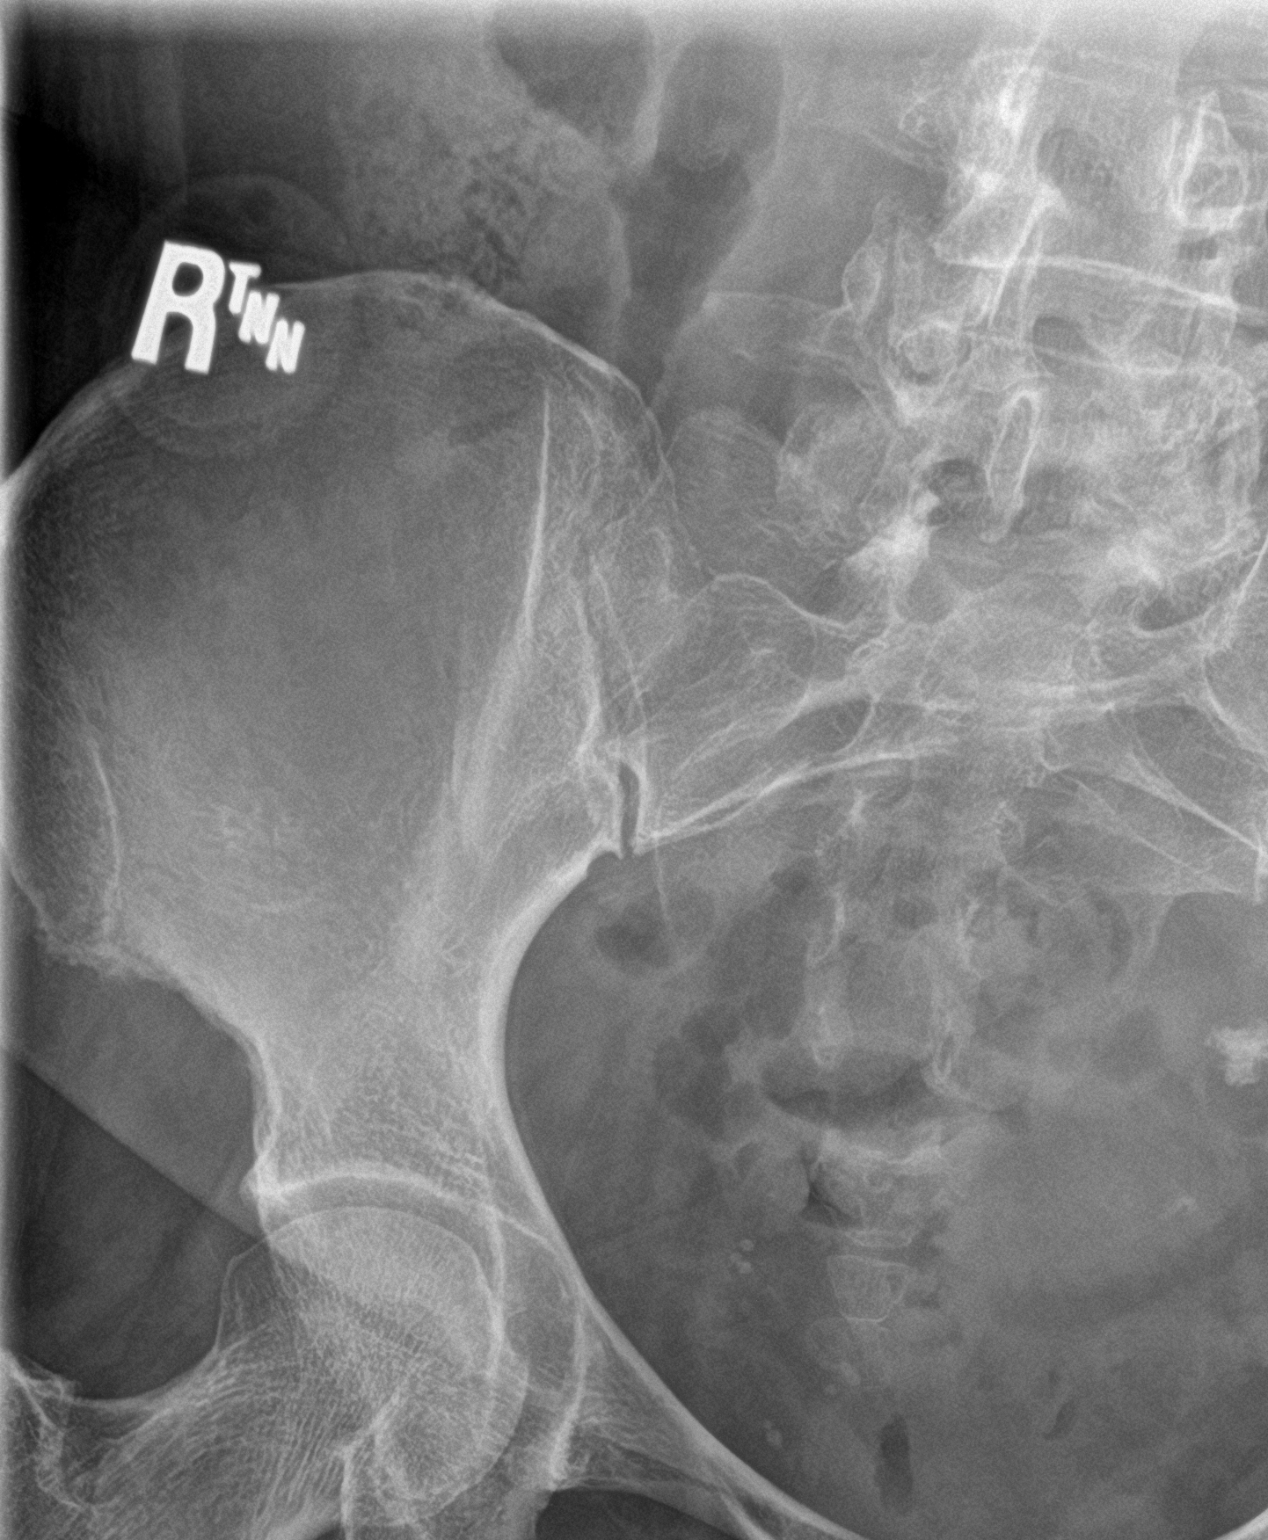

[3 of 3 positions shown; findings below may reference images not displayed]

FINDINGS: The sacroiliac joint spaces are maintained and there is no evidence
of arthropathy. No other bone abnormalities are seen.
IMPRESSION: Negative radiographs of the sacroiliac joints.

## 2021-01-04 IMAGING — CR DG HIP (WITH OR WITHOUT PELVIS) 2-3V*R*
1 series · 3 of 3 positions shown · non-contrast
Comparison: MRI lumbar spine 01/09/2012

CLINICAL DATA: Low back pain extending into the hips bilaterally.

EXAM:
RIGHT KNEE - 1-2 VIEW; LEFT KNEE - 1-2 VIEW; DG HIP (WITH OR WITHOUT
PELVIS) 2-3V LEFT; LUMBAR SPINE - COMPLETE WITH BENDING VIEWS; DG
HIP (WITH OR WITHOUT PELVIS) 2-3V RIGHT

[Series 1: dg hip unilat w or w/o pelvis 2-3 views  · non-contrast · 0.14mm/px · 3 of 3 slices shown]
[im 1/3]
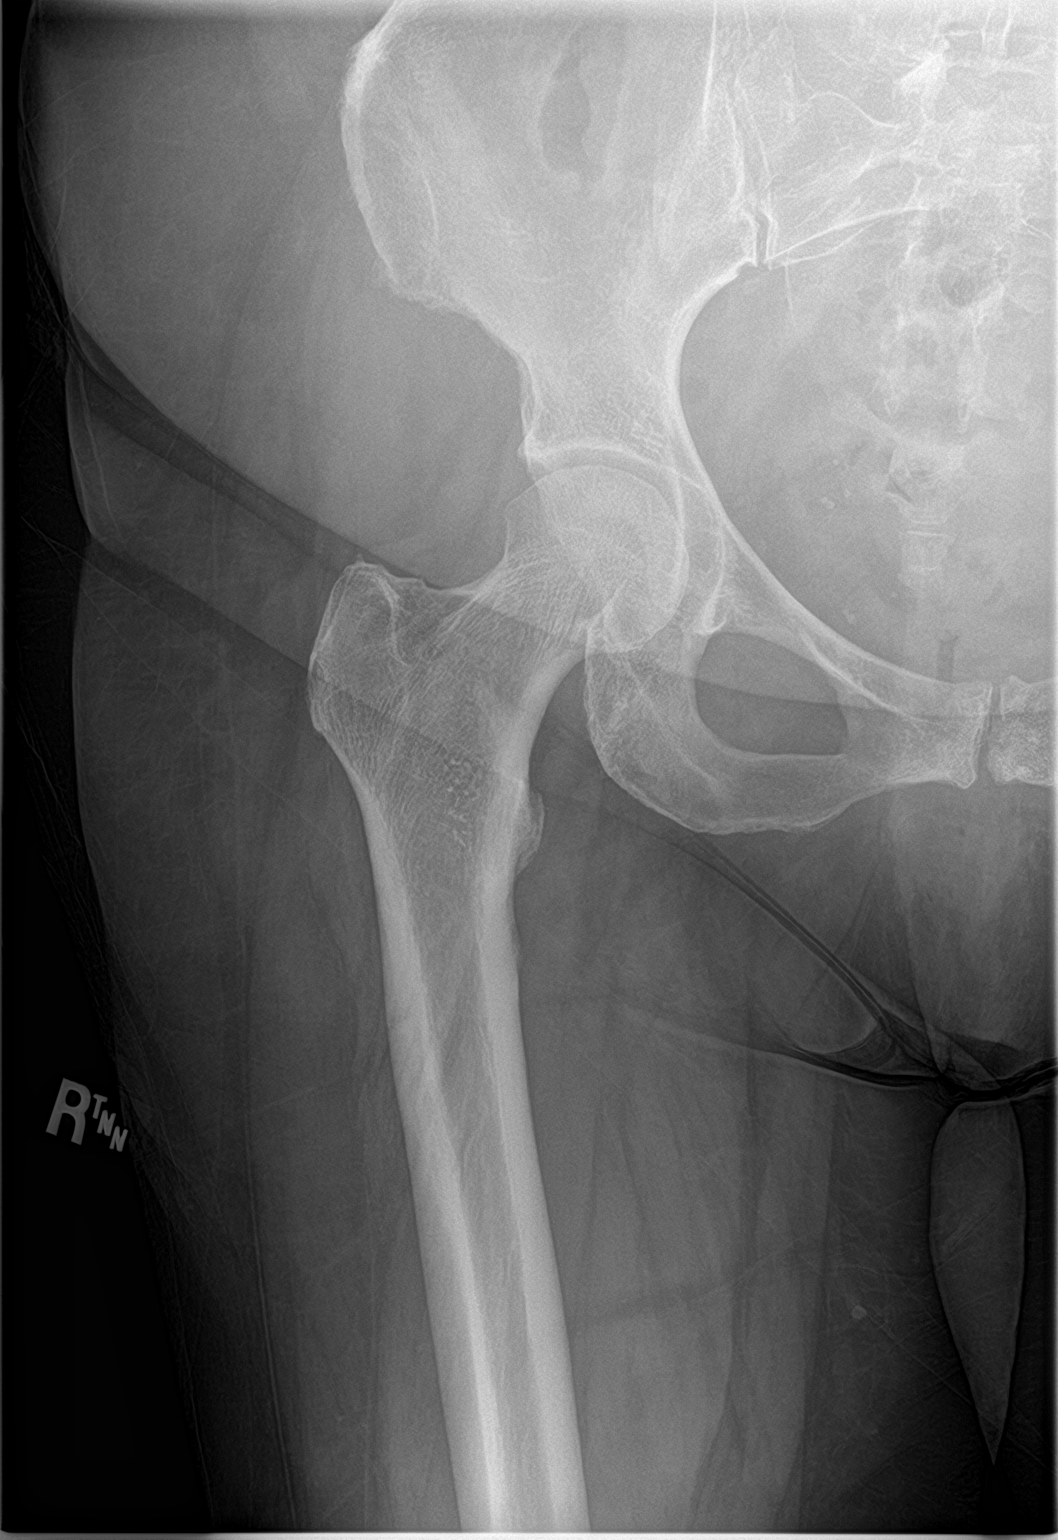
[im 2/3]
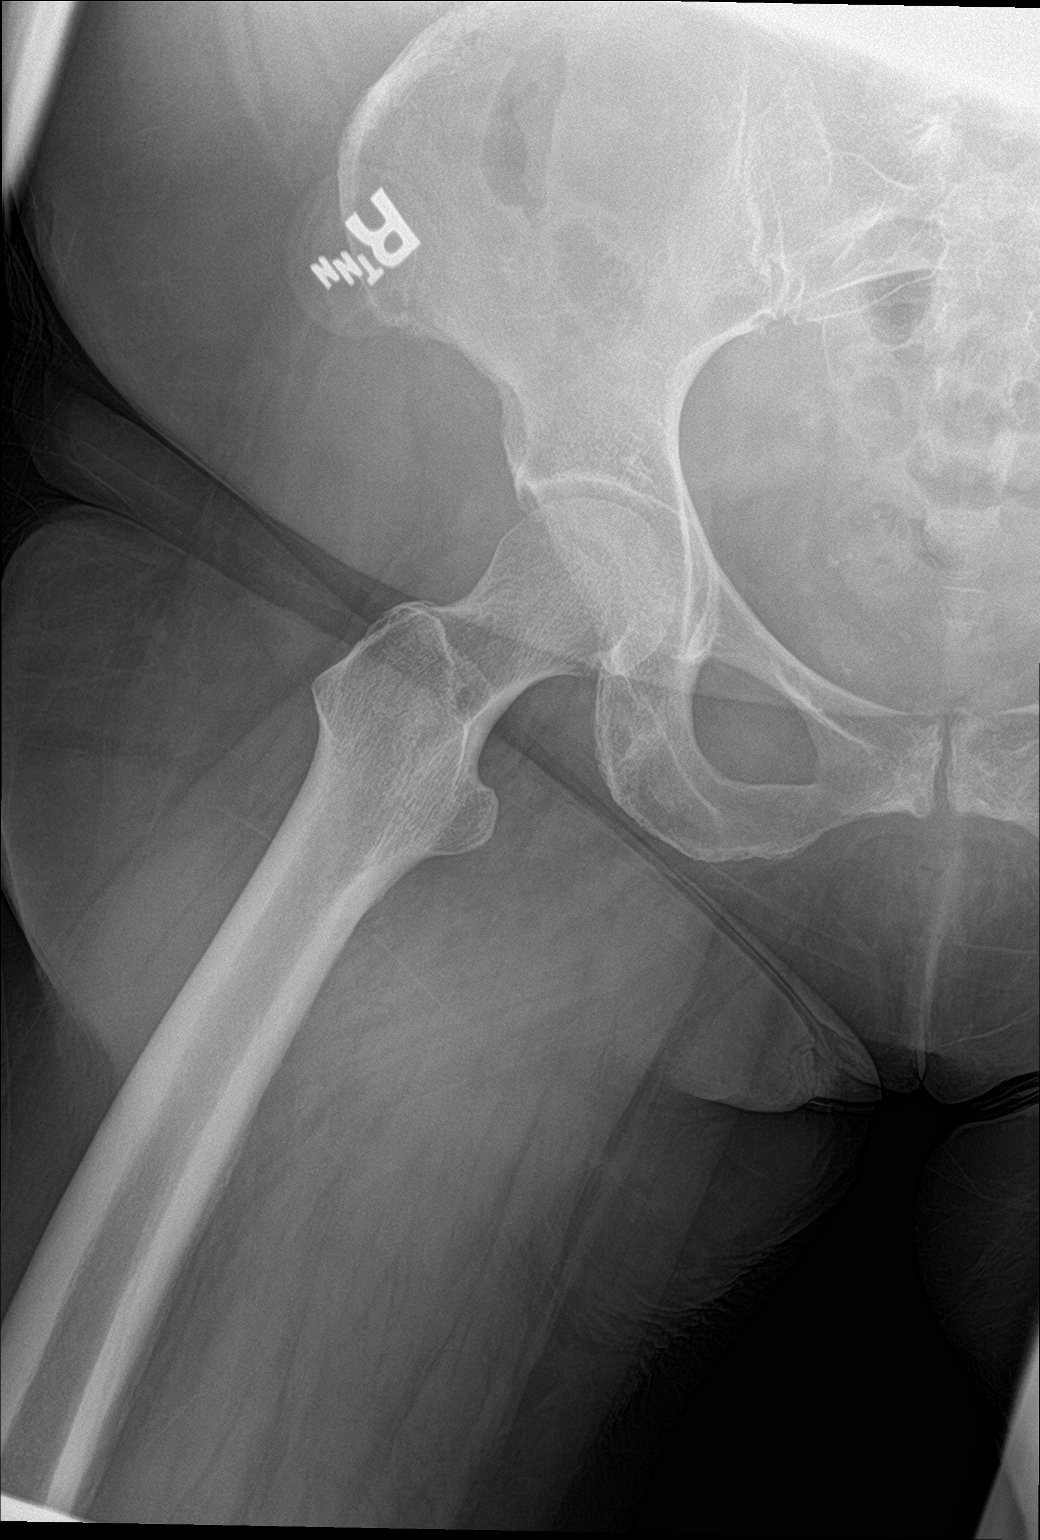
[im 3/3]
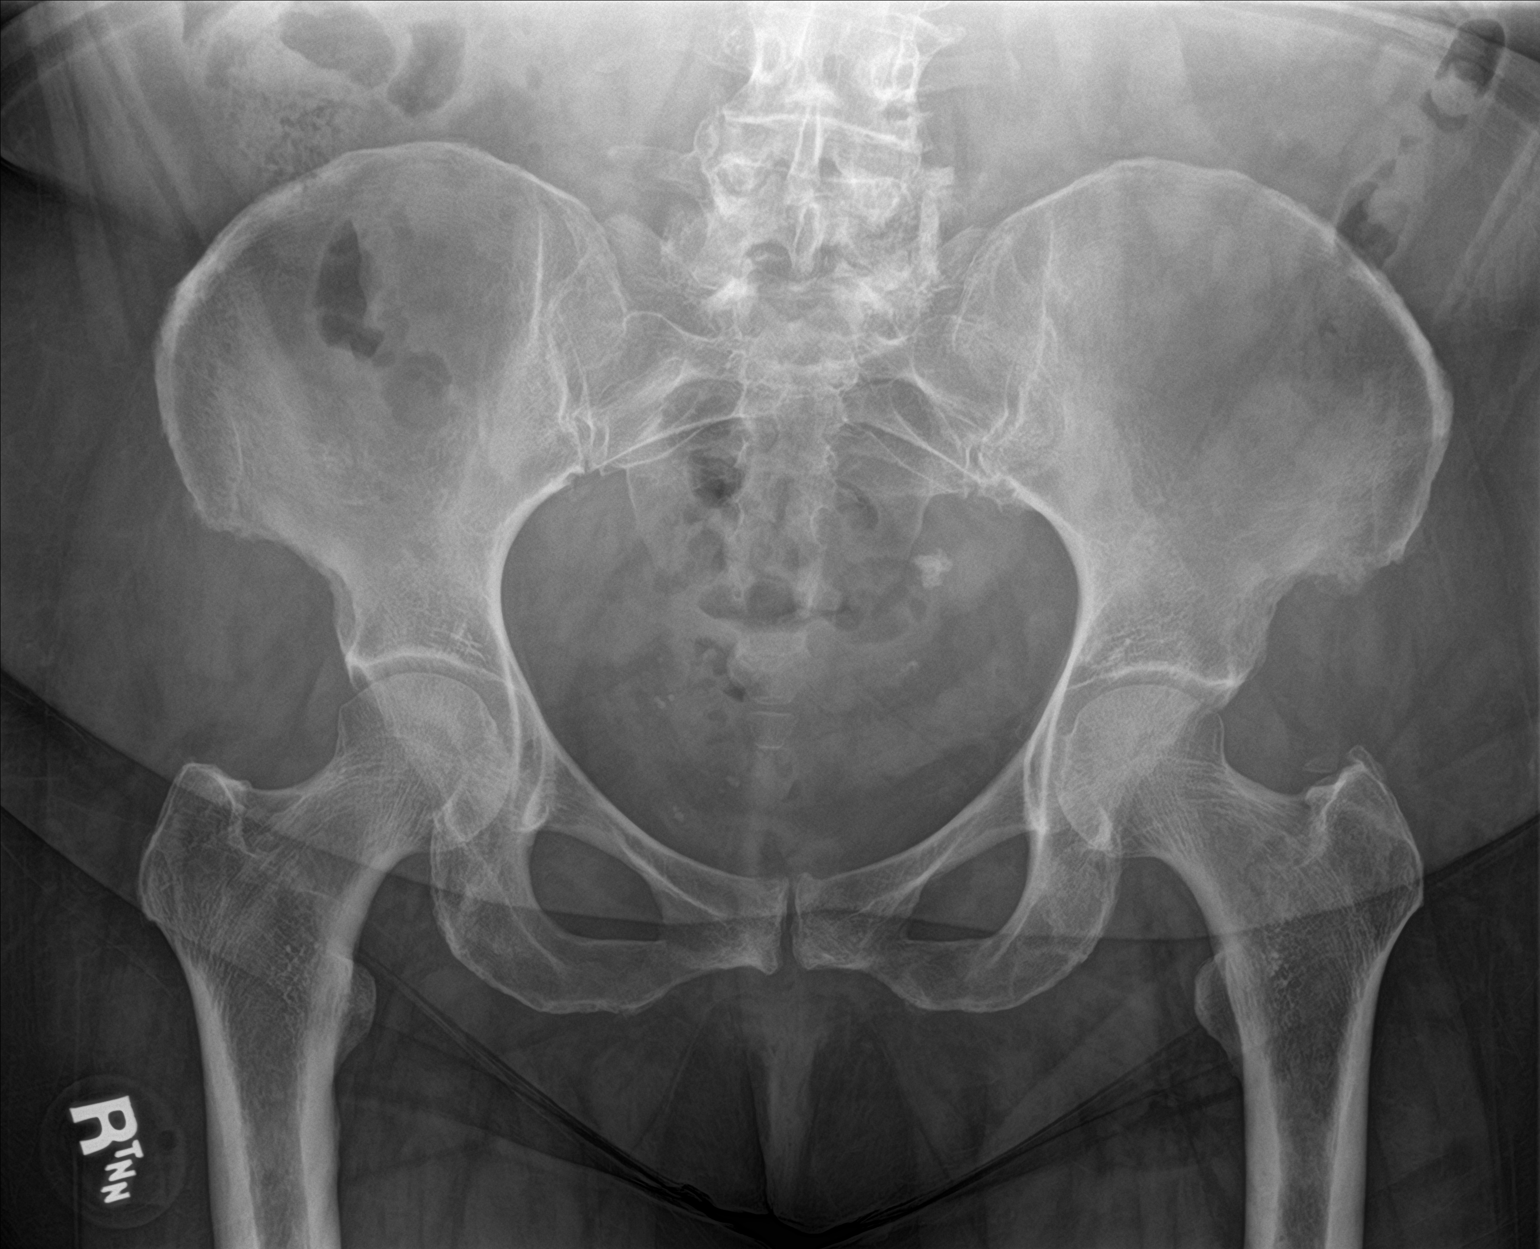

[3 of 3 positions shown; findings below may reference images not displayed]

FINDINGS: Non rib-bearing lumbar type vertebral bodies are present.
Progressive levoconvex curvature of the lumbar spine is centered at
L2-3. Compensatory rightward curvature is present at L5. Slight
retrolisthesis is present at L2-3 and L3-4. There is slight
anterolisthesis at L4-5. Marked facet hypertrophy is present from
L3-4 through L5-S1.

Atherosclerotic calcifications are present in the aorta.

There is no significant change in listhesis associated with flexion
or extension.
IMPRESSION: 1. Progressive levoconvex curvature of the lumbar spine.
2. Retrolisthesis at L3-4 and anterolisthesis at L4-5 is new.
3. Significant facet hypertrophy at L3-4 and L4-5 in particular may
contribute to radicular symptoms.

## 2021-03-09 ENCOUNTER — Encounter: Payer: Self-pay | Admitting: Student in an Organized Health Care Education/Training Program

## 2021-03-09 ENCOUNTER — Ambulatory Visit
Payer: Medicare Other | Attending: Student in an Organized Health Care Education/Training Program | Admitting: Student in an Organized Health Care Education/Training Program

## 2021-03-09 ENCOUNTER — Other Ambulatory Visit: Payer: Self-pay

## 2021-03-09 VITALS — BP 111/71 | HR 76 | Temp 97.0°F | Resp 18 | Ht 63.0 in | Wt 175.0 lb

## 2021-03-09 DIAGNOSIS — M7581 Other shoulder lesions, right shoulder: Secondary | ICD-10-CM | POA: Diagnosis present

## 2021-03-09 DIAGNOSIS — M7591 Shoulder lesion, unspecified, right shoulder: Secondary | ICD-10-CM | POA: Insufficient documentation

## 2021-03-09 DIAGNOSIS — M47816 Spondylosis without myelopathy or radiculopathy, lumbar region: Secondary | ICD-10-CM | POA: Insufficient documentation

## 2021-03-09 DIAGNOSIS — M75101 Unspecified rotator cuff tear or rupture of right shoulder, not specified as traumatic: Secondary | ICD-10-CM

## 2021-03-09 DIAGNOSIS — G894 Chronic pain syndrome: Secondary | ICD-10-CM

## 2021-03-09 DIAGNOSIS — M12811 Other specific arthropathies, not elsewhere classified, right shoulder: Secondary | ICD-10-CM

## 2021-03-09 MED ORDER — BUPRENORPHINE 15 MCG/HR TD PTWK: 1.0000 | MEDICATED_PATCH | TRANSDERMAL | 2 refills | Status: AC

## 2021-03-09 MED ORDER — OXYCODONE HCL 5 MG PO TABS: 5.0000 mg | ORAL_TABLET | Freq: Every day | ORAL | 0 refills | Status: AC | PRN

## 2021-03-09 NOTE — Progress Notes (Signed)
Nursing Pain Medication Assessment:  Safety precautions to be maintained throughout the outpatient stay will include: orient to surroundings, keep bed in low position, maintain call bell within reach at all times, provide assistance with transfer out of bed and ambulation.  Medication Inspection Compliance: Pill count conducted under aseptic conditions, in front of the patient. Neither the pills nor the bottle was removed from the patient's sight at any time. Once count was completed pills were immediately returned to the patient in their original bottle.  Medication: Buprenorphine (Suboxone) Pill/Patch Count:  0 of 4 pills remain Pill/Patch Appearance: Markings consistent with prescribed medication Bottle Appearance: Standard pharmacy container. Clearly labeled. Filled Date: 41 / 29 / 2022 Last Medication intake:  Today

## 2021-03-09 NOTE — Progress Notes (Signed)
PROVIDER NOTE: Information contained herein reflects review and annotations entered in association with encounter. Interpretation of such information and data should be left to medically-trained personnel. Information provided to patient can be located elsewhere in the medical record under "Patient Instructions". Document created using STT-dictation technology, any transcriptional errors that may result from process are unintentional.    Patient: Leslie Carrillo  Service Category: E/M  Provider: Gillis Santa, MD  DOB: 1960-06-30  DOS: 03/09/2021  Specialty: Interventional Pain Management  MRN: [id number]  Setting: Ambulatory outpatient  PCP: Baxter Hire, MD  Type: Established Patient    Referring Provider: Baxter Hire, MD  Location: Office  Delivery: Face-to-face     HPI  Ms. CLOTILE Carrillo, a 60 y.o. year old female, is here today because of her Right rotator cuff tear arthropathy [M75.101, M12.811]. Leslie Carrillo primary complain today is Back Pain (Low qand upper)   Last encounter: My last encounter with her was on 11/24/20  Pertinent problems: Leslie Carrillo has Chronic low back pain; DDD (degenerative disc disease), thoracic; Chronic radicular lumbar pain; Cervical facet joint syndrome; Bilateral primary osteoarthritis of knee; Lumbar degenerative disc disease; Chronic pain syndrome; Myofascial pain syndrome; and Lumbar spondylosis on their pertinent problem list. Pain Assessment: Severity of   is reported as a 5 /10. Location: Back Lower, Mid, Upper/denies. Onset: More than a month ago. Quality: Dull, Aching. Timing: Constant. Modifying factor(s): meds. Vitals:  height is $RemoveB'5\' 3"'MNNrVTqp$  (1.6 m) and weight is 175 lb (79.4 kg). Her temperature is 97 F (36.1 C) (abnormal). Her blood pressure is 111/71 and her pulse is 76. Her respiration is 18 and oxygen saturation is 99%.   Reason for encounter: medication management.    Patient is recovering from her right shoulder surgery.  Is pleased with the  results.  Continues to participate in physical therapy and plans to continue exercises at home. Has an appointment with orthopedics next month. Refill Butrans patch and oxycodone as below.  No change in dose.  Renew annual urine toxicology screen for medication compliance & monitoring.  Pharmacotherapy Assessment  Analgesic: Buprenorphine transdermal patch, 15 mcg an hour, oxycodone 5 mg daily as needed for breakthrough pain, quantity #30 to last for 90 days MME/day: 10 mg/day   Monitoring: [place] PMP: PDMP reviewed during this encounter.       Pharmacotherapy: No side-effects or adverse reactions reported. Compliance: No problems identified. Effectiveness: Clinically acceptable.  UDS:  Summary  Date Value Ref Range Status  03/03/2020 Note  Final    Comment:    ==================================================================== [employer]ox[employer] Select 13 (MW) ==================================================================== Test                             Result       Flag       Units  Drug Present and Declared for Prescription Verification   Buprenorphine                  15           EXPECTED   ng/mg creat   Norbuprenorphine               37           EXPECTED   ng/mg creat    Source of buprenorphine is a scheduled prescription medication.    Norbuprenorphine is an expected metabolite of buprenorphine.  Drug Absent but Declared for Prescription Verification   Oxycodone  Not Detected UNEXPECTED ng/mg creat ==================================================================== Test                      Result    Flag   Units      Ref Range   Creatinine              124              mg/dL      >=20 ==================================================================== Declared Medications:  The flagging and interpretation on this report are based on the  following declared medications.  Unexpected results may arise from  inaccuracies in the declared medications.    **Note: The testing scope of this panel includes these medications:   Oxycodone (Percocet)   **Note: The testing scope of this panel does not include small to  moderate amounts of these reported medications:   Buprenorphine Patch (BuTrans)   **Note: The testing scope of this panel does not include the  following reported medications:   Acetaminophen (Percocet)  Hydroxyzine (Vistaril)  Methocarbamol (Robaxin)  Sertraline (Zoloft)  Triamcinolone (Kenalog) ==================================================================== For clinical consultation, please call [phone]. ====================================================================       ROS  Constitutional: Denies any fever or chills Gastrointestinal: No reported hemesis, hematochezia, vomiting, or acute GI distress Musculoskeletal:  Right shoulder and rotator cuff pain, significantly improved after surgery and with physical therapy Neurological: No reported episodes of acute onset apraxia, aphasia, dysarthria, agnosia, amnesia, paralysis, loss of coordination, or loss of consciousness  Medication Review  acetaminophen, buprenorphine, ondansetron, oxyCODONE, and sertraline  History Review  Allergy: Leslie Carrillo is allergic to codeine, pregabalin, hydrocodone-acetaminophen, tramadol, latex, lovastatin, and simvastatin. Drug: Leslie Carrillo  reports no history of drug use. Alcohol:  reports no history of alcohol use. Tobacco:  reports that she has been smoking cigarettes. She has a 10.00 pack-year smoking history. She has never used smokeless tobacco. Social: Ms. Space  reports that she has been smoking cigarettes. She has a 10.00 pack-year smoking history. She has never used smokeless tobacco. She reports that she does not drink alcohol and does not use drugs. Medical:  has a past medical history of Anemia, Arthritis, Asthma, Chronic kidney disease, Chronic pain, and History of kidney stones. Surgical: Leslie Carrillo  has a past  surgical history that includes Cervical fusion (2014); carpel tunnel (Right); Cholecystectomy; c sections (N/A, 1985); Colonoscopy; and Shoulder arthroscopy with subacromial decompression, rotator cuff repair and bicep tendon repair (Right, 11/08/2020). Family: family history includes Dementia in her mother; Prostate cancer in her father; Renal Cyst in her father.  Laboratory Chemistry Profile   Renal Lab Results  Component Value Date   BUN 11 11/08/2020   CREATININE 0.97 11/08/2020   GFRAA >60 10/16/2019   GFRNONAA >60 11/08/2020     Hepatic Lab Results  Component Value Date   AST 20 10/16/2019   ALT 15 10/16/2019   ALBUMIN 4.0 10/16/2019   ALKPHOS 75 10/16/2019   LIPASE 29 10/16/2019     Electrolytes Lab Results  Component Value Date   NA 137 11/08/2020   K 3.9 11/08/2020   CL 102 11/08/2020   CALCIUM 9.0 11/08/2020     Bone No results found for: VD25OH, VD125OH2TOT, OM7672CN4, BS9628ZM6, 25OHVITD1, 25OHVITD2, 25OHVITD3, TESTOFREE, TESTOSTERONE   Inflammation (CRP: Acute Phase) (ESR: Chronic Phase) No results found for: CRP, ESRSEDRATE, LATICACIDVEN     Note: Above Lab results reviewed.  Recent Imaging Review  CT RENAL STONE STUDY CLINICAL DATA:  Abdominal pain  EXAM: CT ABDOMEN  AND PELVIS WITHOUT CONTRAST  TECHNIQUE: Multidetector CT imaging of the abdomen and pelvis was performed following the standard protocol without IV contrast.  COMPARISON:  CT November 13, 2019  FINDINGS: Lower chest: No acute abnormality.  Hepatobiliary: Unremarkable noncontrast appearance of the hepatic parenchyma. Gallbladder is surgically absent. No biliary ductal dilation.  Pancreas: No evidence of acute pancreatic inflammation or pancreatic ductal dilation.  Spleen: Within normal limits.  Adrenals/Urinary Tract: Bilateral adrenal glands are unremarkable. No hydronephrosis. No renal, ureteral or bladder calculi visualized. Urinary bladder is decompressed limiting  evaluation.  Stomach/Bowel: Stomach is unremarkable for degree of distension. No pathologic dilation of small or large bowel. The appendix and terminal ileum appear normal. Colonic diverticulosis without findings of acute diverticulitis. The colon is predominately decompressed limiting evaluation.  Vascular/Lymphatic: Aortic atherosclerosis without abdominal aortic aneurysm. No pathologically enlarged abdominal or pelvic lymph nodes.  Reproductive: Uterus and bilateral adnexa are unremarkable.  Other: No abdominopelvic ascites.  Musculoskeletal: Levoconvex curvature of the lumbar spine. Multilevel degenerative changes spine. Chronic changes of osteitis pubis. No acute osseous abnormality.  IMPRESSION: 1. No acute findings in the abdomen or pelvis. 2. Colonic diverticulosis without findings of acute diverticulitis. 3.  Aortic Atherosclerosis (ICD10-I70.0).  Electronically Signed   By: Dahlia Bailiff M.D.   On: 11/28/2020 13:01  Note: Reviewed        Physical Exam  General appearance: Well nourished, well developed, and well hydrated. In no apparent acute distress Mental status: Alert, oriented x 3 (person, place, & time)       Respiratory: No evidence of acute respiratory distress Eyes: PERLA Vitals: BP 111/71    Pulse 76    Temp (!) 97 F (36.1 C)    Resp 18    Ht $R'5\' 3"'cA$  (1.6 m)    Wt 175 lb (79.4 kg)    SpO2 99%    BMI 31.00 kg/m  BMI: Estimated body mass index is 31 kg/m as calculated from the following:   Height as of this encounter: $RemoveBeforeD'5\' 3"'nEutVCHGNvSboV$  (1.6 m).   Weight as of this encounter: 175 lb (79.4 kg). Ideal: Ideal body weight: 52.4 kg (115 lb 8.3 oz) Adjusted ideal body weight: 63.2 kg (139 lb 5 oz)  Cervical Spine Area Exam  Skin & Axial Inspection: No masses, redness, edema, swelling, or associated skin lesions Alignment: Symmetrical Functional ROM: Unrestricted ROM      Stability: No instability detected Muscle Tone/Strength: Functionally intact. No obvious  neuro-muscular anomalies detected. Sensory (Neurological): Unimpaired Palpation: No palpable anomalies             Upper Extremity (UE) Exam    Side: Right upper extremity  Side: Left upper extremity  Skin & Extremity Inspection: Skin color, temperature, and hair growth are WNL. No peripheral edema or cyanosis. No masses, redness, swelling, asymmetry, or associated skin lesions. No contractures.  Skin & Extremity Inspection: Skin color, temperature, and hair growth are WNL. No peripheral edema or cyanosis. No masses, redness, swelling, asymmetry, or associated skin lesions. No contractures.  Functional ROM: Limited ROM however significantly improved from before          Functional ROM: Unrestricted ROM          Muscle Tone/Strength: Functionally intact. No obvious neuro-muscular anomalies detected.  Muscle Tone/Strength: Functionally intact. No obvious neuro-muscular anomalies detected.  Sensory (Neurological): Unimpaired          Sensory (Neurological): Unimpaired          Palpation: No palpable anomalies  Palpation: No palpable anomalies              Provocative Test(s):  Phalen's test: deferred Tinel's test: deferred Apley's scratch test (touch opposite shoulder):  Action 1 (Across chest): deferred Action 2 (Overhead): deferred Action 3 (LB reach): deferred   Provocative Test(s):  Phalen's test: deferred Tinel's test: deferred Apley's scratch test (touch opposite shoulder):  Action 1 (Across chest): deferred Action 2 (Overhead): deferred Action 3 (LB reach): deferred        Assessment   Status Diagnosis  Improved Resolved Improved 1. Right rotator cuff tear arthropathy   2. Tear of right supraspinatus tendon   3. Supraspinatus tendonitis, right   4. Lumbar facet arthropathy   5. Infraspinatus tendinitis, right   6. Chronic pain syndrome   7. Lumbar spondylosis        Plan of Care   Ms. ANTWANETTE WESCHE has a current medication list which includes the  following long-term medication(s): sertraline.  Pharmacotherapy (Medications Ordered): Meds ordered this encounter  Medications   buprenorphine (BUTRANS) 15 MCG/HR    Sig: Place 1 patch onto the skin once a week.    Dispense:  4 patch    Refill:  2    Chronic Pain: STOP Act (Not applicable) Fill 1 day early if closed on refill date. Avoid benzodiazepines within 8 hours of opioids   oxyCODONE (OXY IR/ROXICODONE) 5 MG immediate release tablet    Sig: Take 1 tablet (5 mg total) by mouth daily as needed for severe pain.    Dispense:  30 tablet    Refill:  0    Chronic Pain: STOP Act (Not applicable) Fill 1 day early if closed on refill date.   Orders Placed This Encounter  Procedures   ToxASSURE Select 13 (MW), Urine    Volume: 30 ml(s). Minimum 3 ml of urine is needed. Document temperature of fresh sample. Indications: Long term (current) use of opiate analgesic [phone])    Order Specific Question:   Release to patient    Answer:   Immediate     Follow-up plan:   Return in about 3 months (around 06/07/2021) for Medication Management, in person.    Recent Visits No visits were found meeting these conditions. Showing recent visits within past 90 days and meeting all other requirements Today's Visits Date Type Provider Dept  03/09/21 Office Visit Gillis Santa, MD [facility]  Showing today's visits and meeting all other requirements Future Appointments Date Type Provider Dept  06/06/21 Appointment Gillis Santa, MD [facility]  Showing future appointments within next 90 days and meeting all other requirements  I discussed the assessment and treatment plan with the patient. The patient was provided an opportunity to ask questions and all were answered. The patient agreed with the plan and demonstrated an understanding of the instructions.  Patient advised to call back or seek an in-person evaluation if the symptoms or condition worsens.  Duration of  encounter:44minutes.  Note by: Gillis Santa, MD Date: 03/09/2021; Time: 11:37 AM

## 2021-03-17 LAB — TOXASSURE SELECT 13 (MW), URINE

## 2021-04-05 ENCOUNTER — Other Ambulatory Visit: Payer: Self-pay | Admitting: Internal Medicine

## 2021-04-05 DIAGNOSIS — R3129 Other microscopic hematuria: Secondary | ICD-10-CM

## 2021-04-12 ENCOUNTER — Ambulatory Visit
Admission: RE | Admit: 2021-04-12 | Discharge: 2021-04-12 | Disposition: A | Discharge status: Home / Self Care | Payer: Medicare Other | Source: Ambulatory Visit | Attending: Internal Medicine | Admitting: Internal Medicine

## 2021-04-12 ENCOUNTER — Other Ambulatory Visit: Payer: Self-pay

## 2021-04-12 DIAGNOSIS — R3129 Other microscopic hematuria: Secondary | ICD-10-CM | POA: Insufficient documentation

## 2021-05-15 ENCOUNTER — Ambulatory Visit (INDEPENDENT_AMBULATORY_CARE_PROVIDER_SITE_OTHER): Payer: Medicare Other | Admitting: Dermatology

## 2021-05-15 ENCOUNTER — Other Ambulatory Visit: Payer: Self-pay

## 2021-05-15 DIAGNOSIS — L309 Dermatitis, unspecified: Secondary | ICD-10-CM

## 2021-05-15 DIAGNOSIS — L304 Erythema intertrigo: Secondary | ICD-10-CM

## 2021-05-15 DIAGNOSIS — L720 Epidermal cyst: Secondary | ICD-10-CM | POA: Diagnosis not present

## 2021-05-15 MED ORDER — CLOBETASOL PROPIONATE 0.05 % EX SOLN: CUTANEOUS | 1 refills | Status: DC

## 2021-05-15 MED ORDER — EUCRISA 2 % EX OINT: TOPICAL_OINTMENT | CUTANEOUS | 1 refills | Status: DC

## 2021-05-15 NOTE — Progress Notes (Signed)
New Patient Visit  Subjective  Leslie Carrillo is a 61 y.o. female who presents for the following: Rash.  New patient presents for itchy rash of the trunk, face, extremities, and scalp. Rash first started a couple of years ago after she had Covid vaccine, but comes and goes. Last flare was around 1-2 weeks ago. Patient has a history of asthma years ago. No history of eczema in past.   Referral from Dr Enid Baas, MD, [facility].  The following portions of the chart were reviewed this encounter and updated as appropriate:       Review of Systems:  No other skin or systemic complaints except as noted in HPI or Assessment and Plan.  Objective  Well appearing patient in no apparent distress; mood and affect are within normal limits.  A focused examination was performed including scalp, face, trunk, extremities. Relevant physical exam findings are noted in the Assessment and Plan.  R med intermammary Erythema with maceration  scalp; trunk; extremities Pink excoriated papules of the lower back; light pink scaly patches of the bil antecubitum; pink macules with xerosis of the left lower leg; pink scaliness with excoriations of the right scalp  Left Cheek Pink firm flat papule, present for about 1-2 mos    Assessment & Plan  Erythema intertrigo R med intermammary  Intertrigo is a chronic recurrent rash that occurs in skin fold areas that may be associated with friction; heat; moisture; yeast; fungus; and bacteria.  It is exacerbated by increased movement / activity; sweating; and higher atmospheric temperature.  Start Eucrisa Ointment Apply to AA rash under breasts BID until improved.   Dermatitis scalp; trunk; extremities  Probable Atopic Dermatitis  Atopic dermatitis (eczema) is a chronic, relapsing, pruritic condition that can significantly affect quality of life. It is often associated with allergic rhinitis and/or asthma and can require treatment with topical  medications, phototherapy, or in severe cases biologic injectable medication (Dupixent; Adbry) or Oral JAK inhibitors.  Continue hydroxyzine 25mg  take 1 po qhs prn itch. Pt has refill from PCP.   Start clobetasol/CeraVe mix (Patient to mix clobetasol in jar of CeraVe) Apply all over BID until rash improved dsp 27mL 1Rf.   Start Eucrisa Ointment Apply qd/bid to AA rash until improved.  If Eucrisa not covered, will send in tacrolimus ointment.   Topical steroids (such as triamcinolone, fluocinolone, fluocinonide, mometasone, clobetasol, halobetasol, betamethasone, hydrocortisone) can cause thinning and lightening of the skin if they are used for too long in the same area. Your physician has selected the right strength medicine for your problem and area affected on the body. Please use your medication only as directed by your physician to prevent side effects.      clobetasol (TEMOVATE) 0.05 % external solution - scalp; trunk; extremities Patient to mix clobetasol solution in jar of CeraVe Cream. Apply all over twice daily up to 4 weeks until itchy rash improved. Avoid face, groin, axilla.  Crisaborole (EUCRISA) 2 % OINT - scalp; trunk; extremities Apply to affected areas face, skin fold areas once to twice daily until improved.  Epidermal cyst Left Cheek  Inflamed  Start Eucrisa Ointment Apply BID AA twice daily until improved.   Recheck on f/u.    Return in about 1 month (around 06/12/2021) for Atopic Dermatitis.  I3/29/2023, CMA, am acting as scribe for Cherlyn Labella, MD .  Documentation: I have reviewed the above documentation for accuracy and completeness, and I agree with the above.  Willeen Niece  MD

## 2021-05-15 NOTE — Patient Instructions (Addendum)
Eczema Skin Care  Buy TWO 16oz jars of CeraVe moisturizing cream  [facility], [facility], [facility] (no prescription needed)  Costs about $15 per jar   Jar #1: Use as a moisturizer as needed. Can be applied to any area of the body. Use twice daily to unaffected areas.  Jar #2: Pour one 69ml bottle of clobetasol 0.05% solution into jar, mix well. Label this jar to indicate the medication has been added. Use twice daily to affected areas. Do not apply to face, groin or underarms.  Moisturizer may burn or sting initially. Try for at least 4 weeks.    Eucrisa Ointment - apply to Inflamed Cyst of the left cheek twice daily until improved. May also use on affected areas rash on face or body/skin folds/under breasts twice daily until improved.    Dry Skin Care  What causes dry skin?  Dry skin is common and results from inadequate moisture in the outer skin layers. Dry skin usually results from the excessive loss of moisture from the skin surface. This occurs due to two major factors: Normally the skin's oil glands deposit a layer of oil on the skin's surface. This layer of oil prevents the loss of moisture from the skin. Exposure to soaps, cleaners, solvents, and disinfectants removes this oily film, allowing water to escape. Water loss from the skin increases when the humidity is low. During winter months we spend a lot of time indoors where the air is heated. Heated air has very low humidity. This also contributes to dry skin.  A tendency for dry skin may accompany such disorders as eczema. Also, as people age, the number of functioning oil glands decreases, and the tendency toward dry skin can be a sensation of skin tightness when emerging from the shower.  How do I manage dry skin?  Humidify your environment. This can be accomplished by using a humidifier in your bedroom at night during winter months. Bathing can actually put moisture back into your skin if done right. Take the following steps while  bathing to sooth dry skin: Avoid hot water, which only dries the skin and makes itching worse. Use warm water. Avoid washcloths or extensive rubbing or scrubbing. Use mild soaps like unscented Dove, Oil of Olay, Cetaphil, Basis, or CeraVe. If you take baths rather than showers, rinse off soap residue with clean water before getting out of tub. Once out of the shower/tub, pat dry gently with a soft towel. Leave your skin damp. While still damp, apply any medicated ointment/cream you were prescribed to the affected areas. After you apply your medicated ointment/cream, then apply your moisturizer to your whole body.This is the most important step in dry skin care. If this is omitted, your skin will continue to be dry. The choice of moisturizer is also very important. In general, lotion will not provider enough moisture to severely dry skin because it is water based. You should use an ointment or cream. Moisturizers should also be unscented. Good choices include Vaseline (plain petrolatum), Aquaphor, Cetaphil, CeraVe, Vanicream, DML Forte, Aveeno moisture, or Eucerin Cream. Bath oils can be helpful, but do not replace the application of moisturizer after the bath. In addition, they make the tub slippery causing an increased risk for falls. Therefore, we do not recommend their use.  If You Need Anything After Your Visit  If you have any questions or concerns for your doctor, please call our main line at [phone] and press option 4 to reach your doctor's medical assistant. If no  one answers, please leave a voicemail as directed and we will return your call as soon as possible. Messages left after 4 pm will be answered the following business day.   You may also send [place] a message via MyChart. We typically respond to MyChart messages within 1-2 business days.  For prescription refills, please ask your pharmacy to contact our office. Our fax number is [phone].  If you have an urgent issue when the  clinic is closed that cannot wait until the next business day, you can page your doctor at the number below.    Please note that while we do our best to be available for urgent issues outside of office hours, we are not available 24/7.   If you have an urgent issue and are unable to reach [place], you may choose to seek medical care at your doctor's office, retail clinic, urgent care center, or emergency room.  If you have a medical emergency, please immediately call 911 or go to the emergency department.  Pager Numbers  - Dr. Gwen Pounds: [phone]  - Dr. Neale Burly: [phone]  - Dr. Roseanne Reno: [phone]  In the event of inclement weather, please call our main line at [phone] for an update on the status of any delays or closures.  Dermatology Medication Tips: Please keep the boxes that topical medications come in in order to help keep track of the instructions about where and how to use these. Pharmacies typically print the medication instructions only on the boxes and not directly on the medication tubes.   If your medication is too expensive, please contact our office at [phone] option 4 or send [place] a message through MyChart.   We are unable to tell what your co-pay for medications will be in advance as this is different depending on your insurance coverage. However, we may be able to find a substitute medication at lower cost or fill out paperwork to get insurance to cover a needed medication.   If a prior authorization is required to get your medication covered by your insurance company, please allow [place] 1-2 business days to complete this process.  Drug prices often vary depending on where the prescription is filled and some pharmacies may offer cheaper prices.  The website www.goodrx.com contains coupons for medications through different pharmacies. The prices here do not account for what the cost may be with help from insurance (it may be cheaper with your insurance), but the  website can give you the price if you did not use any insurance.  - You can print the associated coupon and take it with your prescription to the pharmacy.  - You may also stop by our office during regular business hours and pick up a GoodRx coupon card.  - If you need your prescription sent electronically to a different pharmacy, notify our office through [facility] or by phone at [phone] option 4.     Si Usted Necesita Algo Despus de Su Visita  Tambin puede enviarnos un mensaje a travs de Clinical cytogeneticist. Por lo general respondemos a los mensajes de MyChart en el transcurso de 1 a 2 das hbiles.  Para renovar recetas, por favor pida a su farmacia que se ponga en contacto con nuestra oficina. Annie Sable de fax es [place] [phone].  Si tiene un asunto urgente cuando la clnica est cerrada y que no puede esperar hasta el siguiente da hbil, puede llamar/localizar a su doctor(a) al nmero que aparece a continuacin.   Por favor, tenga en cuenta  que aunque hacemos todo lo posible para estar disponibles para asuntos urgentes fuera del horario de [place], no estamos disponibles las 24 horas del da, los 7 [address]  [address] de la [place].   Si tiene un problema urgente y no puede comunicarse con nosotros, puede optar por buscar atencin mdica  en el consultorio de su doctor(a), en una clnica privada, en un centro de atencin urgente o en una sala de emergencias.  Si tiene Engineer, drilling, por favor llame inmediatamente al 911 o vaya a la sala de emergencias.  Nmeros de bper  - Dr. Gwen Pounds: [phone]  - Dra. Moye: [phone]  - Dra. Roseanne Reno: [phone]  En caso de inclemencias del [place], por favor llame a Lacy Duverney principal al [phone] para una actualizacin sobre el [place] de cualquier retraso o cierre.  Consejos para la medicacin en dermatologa: Por favor, guarde las cajas en las que vienen los medicamentos de uso tpico para ayudarle a seguir las  instrucciones sobre dnde y cmo usarlos. Las farmacias generalmente imprimen las instrucciones del medicamento slo en las cajas y no directamente en los tubos del [place].   Si su medicamento es muy caro, por favor, pngase en contacto con Rolm Gala llamando al [phone] y presione la opcin 4 o envenos un mensaje a travs de Clinical cytogeneticist.   No podemos decirle cul ser su copago por los medicamentos por adelantado ya que esto es diferente dependiendo de la cobertura de su seguro. Sin embargo, es posible que podamos encontrar un medicamento sustituto a Audiological scientist un formulario para que el seguro cubra el medicamento que se considera necesario.   Si se requiere una autorizacin previa para que su compaa de seguros [place] su medicamento, por favor permtanos de 1 a 2 das hbiles para completar [address]  Los precios de los medicamentos varan con frecuencia dependiendo del Environmental consultant de dnde se surte la receta y alguna farmacias pueden ofrecer precios ms baratos.  El sitio web www.goodrx.com tiene cupones para medicamentos de Health and safety inspector. Los precios aqu no tienen en cuenta lo que podra costar con la ayuda del seguro (puede ser ms barato con su seguro), pero el sitio web puede darle el precio si no utiliz Tourist information centre manager.  - Puede imprimir el cupn correspondiente y llevarlo con su receta a la farmacia.  - Tambin puede pasar por nuestra oficina durante el horario de atencin regular y Education officer, museum una tarjeta de cupones de GoodRx.  - Si necesita que su receta se enve electrnicamente a una farmacia diferente, informe a nuestra oficina a travs de MyChart de [place] o por telfono llamando al [phone] y presione la opcin 4.

## 2021-06-06 ENCOUNTER — Ambulatory Visit
Payer: Medicare Other | Attending: Student in an Organized Health Care Education/Training Program | Admitting: Student in an Organized Health Care Education/Training Program

## 2021-06-06 ENCOUNTER — Encounter: Payer: Self-pay | Admitting: Student in an Organized Health Care Education/Training Program

## 2021-06-06 ENCOUNTER — Other Ambulatory Visit: Payer: Self-pay

## 2021-06-06 VITALS — BP 137/79 | HR 80 | Temp 97.1°F | Resp 18 | Ht 63.0 in | Wt 175.0 lb

## 2021-06-06 DIAGNOSIS — M7062 Trochanteric bursitis, left hip: Secondary | ICD-10-CM | POA: Diagnosis present

## 2021-06-06 DIAGNOSIS — M12811 Other specific arthropathies, not elsewhere classified, right shoulder: Secondary | ICD-10-CM | POA: Insufficient documentation

## 2021-06-06 DIAGNOSIS — M7581 Other shoulder lesions, right shoulder: Secondary | ICD-10-CM | POA: Diagnosis not present

## 2021-06-06 DIAGNOSIS — M47816 Spondylosis without myelopathy or radiculopathy, lumbar region: Secondary | ICD-10-CM | POA: Insufficient documentation

## 2021-06-06 DIAGNOSIS — M7061 Trochanteric bursitis, right hip: Secondary | ICD-10-CM | POA: Insufficient documentation

## 2021-06-06 DIAGNOSIS — M75101 Unspecified rotator cuff tear or rupture of right shoulder, not specified as traumatic: Secondary | ICD-10-CM | POA: Insufficient documentation

## 2021-06-06 MED ORDER — BUPRENORPHINE 15 MCG/HR TD PTWK: 1.0000 | MEDICATED_PATCH | TRANSDERMAL | 2 refills | Status: DC

## 2021-06-06 MED ORDER — METHYLPREDNISOLONE 4 MG PO TBPK: ORAL_TABLET | ORAL | 0 refills | Status: AC

## 2021-06-06 MED ORDER — OXYCODONE HCL 5 MG PO TABS: 5.0000 mg | ORAL_TABLET | Freq: Every day | ORAL | 0 refills | Status: AC | PRN

## 2021-06-06 NOTE — Progress Notes (Signed)
PROVIDER NOTE: Information contained herein reflects review and annotations entered in association with encounter. Interpretation of such information and data should be left to medically-trained personnel. Information provided to patient can be located elsewhere in the medical record under "Patient Instructions". Document created using STT-dictation technology, any transcriptional errors that may result from process are unintentional.  ?  ?Patient: Leslie Carrillo  Service Category: E/M  Provider: Bilal Lateef, MD  ?DOB: 04/10/1960  DOS: 06/06/2021  Specialty: Interventional Pain Management  ?MRN: [id number]  Setting: Ambulatory outpatient  PCP: Kalisetti, Radhika, MD  ?Type: Established Patient    Referring Provider: Johnston, John D, MD  ?Location: Office  Delivery: Face-to-face    ? ?HPI  ?Leslie Carrillo, a 61 y.o. year old female, is here today because of her Greater trochanteric bursitis of both hips [M70.61, M70.62]. Leslie Carrillo's primary complain today is Back Pain (Lower and upper) and Shoulder Pain (right) ? ? ?Last encounter: My last encounter with her was on 03/09/21 ? ?Pertinent problems: Leslie Carrillo has Chronic low back pain; DDD (degenerative disc disease), thoracic; Chronic radicular lumbar pain; Cervical facet joint syndrome; Bilateral primary osteoarthritis of knee; Lumbar degenerative disc disease; Chronic pain syndrome; Myofascial pain syndrome; and Lumbar spondylosis on their pertinent problem list. ?Pain Assessment: Severity of Chronic pain is reported as a 3 /10. Location: Back Upper, Lower/right shoulder, bilateral hips. Onset: More than a month ago. Quality: Aching, Sharp, Shooting. Timing: Intermittent. Modifying factor(s): medications, heat, rest. ?Vitals:  height is 5' 3" (1.6 m) and weight is 175 lb (79.4 kg). Her temporal temperature is 97.1 ?F (36.2 ?C) (abnormal). Her blood pressure is 137/79 and her pulse is 80. Her respiration is 18 and oxygen saturation is 100%.  ? ?Reason for encounter:  medication management.   ? ?Increased bilateral hip pain, right > left ?Worse with weight bearing, TTP along right GTB ?Refill Butrans patch and oxycodone as below.  No change in dose.  ? ?Pharmacotherapy Assessment  ?Analgesic: Buprenorphine transdermal patch, 15 mcg an hour, oxycodone 5 mg daily as needed for breakthrough pain, quantity #30 to last for 90 days ?MME/day: 10 mg/day  ? ?Monitoring: ?[place] PMP: PDMP not reviewed this encounter.       ?Pharmacotherapy: No side-effects or adverse reactions reported. ?Compliance: No problems identified. ?Effectiveness: Clinically acceptable.  UDS:  ?Summary  ?Date Value Ref Range Status  ?03/09/2021 Note  Final  ?  Comment:  ?  ==================================================================== ?ToxASSURE Select 13 (MW) ?==================================================================== ?Test                             Result       Flag       Units ? ?Drug Present and Declared for Prescription Verification ?  Buprenorphine                  28           EXPECTED   ng/mg creat ?  Norbuprenorphine               35           EXPECTED   ng/mg creat ?   Source of buprenorphine is a scheduled prescription medication. ?   Norbuprenorphine is an expected metabolite of buprenorphine. ? ?Drug Absent but Declared for Prescription Verification ?  Oxycodone                      Not Detected   UNEXPECTED ng/mg creat ?==================================================================== ?Test                      Result    Flag   Units      Ref Range ?  Creatinine              153              mg/dL      >=20 ?==================================================================== ?Declared Medications: ? The flagging and interpretation on this report are based on the ? following declared medications.  Unexpected results may arise from ? inaccuracies in the declared medications. ? ? **Note: The testing scope of this panel includes these medications: ? ? Oxycodone (Roxicodone) ? ? **Note: The  testing scope of this panel does not include small to ? moderate amounts of these reported medications: ? ? Buprenorphine Patch (BuTrans) ? ? **Note: The testing scope of this panel does not include the ? following reported medications: ? ? Acetaminophen (Tylenol) ? Ondansetron (Zofran) ? Sertraline (Zoloft) ?==================================================================== ?For clinical consultation, please call [phone]. ?==================================================================== ?  ?  ? ? ?ROS  ?Constitutional: Denies any fever or chills ?Gastrointestinal: No reported hemesis, hematochezia, vomiting, or acute GI distress ?Musculoskeletal: R> L hip pain ?Neurological: No reported episodes of acute onset apraxia, aphasia, dysarthria, agnosia, amnesia, paralysis, loss of coordination, or loss of consciousness ? ?Medication Review  ?Cholecalciferol, acetaminophen, buprenorphine, cloNIDine, clobetasol, hydrOXYzine, methylPREDNISolone, oxyCODONE, and sertraline ? ?History Review  ?Allergy: Leslie Carrillo is allergic to codeine, pregabalin, hydrocodone-acetaminophen, tramadol, latex, lovastatin, and simvastatin. ?Drug: Leslie Carrillo  reports no history of drug use. ?Alcohol:  reports no history of alcohol use. ?Tobacco:  reports that she has been smoking cigarettes. She has a 10.00 pack-year smoking history. She has never used smokeless tobacco. ?Social: Leslie Carrillo  reports that she has been smoking cigarettes. She has a 10.00 pack-year smoking history. She has never used smokeless tobacco. She reports that she does not drink alcohol and does not use drugs. ?Medical:  has a past medical history of Anemia, Arthritis, Asthma, Chronic kidney disease, Chronic pain, and History of kidney stones. ?Surgical: Leslie Carrillo  has a past surgical history that includes Cervical fusion (2014); carpel tunnel (Right); Cholecystectomy; c sections (N/A, 1985); Colonoscopy; and Shoulder arthroscopy with subacromial decompression,  rotator cuff repair and bicep tendon repair (Right, 11/08/2020). ?Family: family history includes Dementia in her mother; Prostate cancer in her father; Renal Cyst in her father. ? ?Laboratory Chemistry Profile  ? ?Renal ?Lab Results  ?Component Value Date  ? BUN 11 11/08/2020  ? CREATININE 0.97 11/08/2020  ? GFRAA >60 10/16/2019  ? GFRNONAA >60 11/08/2020  ? ?  Hepatic ?Lab Results  ?Component Value Date  ? AST 20 10/16/2019  ? ALT 15 10/16/2019  ? ALBUMIN 4.0 10/16/2019  ? ALKPHOS 75 10/16/2019  ? LIPASE 29 10/16/2019  ? ?  ?Electrolytes ?Lab Results  ?Component Value Date  ? NA 137 11/08/2020  ? K 3.9 11/08/2020  ? CL 102 11/08/2020  ? CALCIUM 9.0 11/08/2020  ? ?  Bone ?No results found for: [place], [id number], [id number], [id number], 25OHVITD1, 25OHVITD2, 25OHVITD3, TESTOFREE, TESTOSTERONE ?  ?Inflammation (CRP: Acute Phase) (ESR: Chronic Phase) ?No results found for: CRP, ESRSEDRATE, LATICACIDVEN ?    ?Note: Above Lab results reviewed. ? ?Recent Imaging Review  ?US RENAL ?CLINICAL DATA:  Microscopic hematuria. ? ?EXAM: ?RENAL / URINARY TRACT ULTRASOUND COMPLETE ? ?COMPARISON:  None. ? ?FINDINGS: ?Right Kidney: ? ?Renal measurements: 9.8 x  4.6 x 5.0 cm = volume: 116 mL. ?Echogenicity within normal limits. No mass or hydronephrosis ?visualized. ? ?Left Kidney: ? ?Renal measurements: 10.2 x 4.0 x 4.2 cm = volume: 90 mL. ?Echogenicity within normal limits. No mass or hydronephrosis ?visualized. ? ?Bladder: ? ?Appears normal for degree of bladder distention. ? ?Other: ? ?None. ? ?IMPRESSION: ?Normal study.  No cause for microscopic hematuria identified. ? ?Electronically Signed ?  By: David  Williams III M.D. ?  On: 04/13/2021 20:10 ? ?Note: Reviewed       ? ?Physical Exam  ?General appearance: Well nourished, well developed, and well hydrated. In no apparent acute distress ?Mental status: Alert, oriented x 3 (person, place, & time)       ?Respiratory: No evidence of acute respiratory distress ?Eyes: PERLA ?Vitals: BP  137/79   Pulse 80   Temp (!) 97.1 ?F (36.2 ?C) (Temporal)   Resp 18   Ht 5' 3" (1.6 m)   Wt 175 lb (79.4 kg)   SpO2 100%   BMI 31.00 kg/m?  ?BMI: Estimated body mass index is 31 kg/m? as calculated f

## 2021-06-06 NOTE — Progress Notes (Signed)
Nursing Pain Medication Assessment:  ?Safety precautions to be maintained throughout the outpatient stay will include: orient to surroundings, keep bed in low position, maintain call bell within reach at all times, provide assistance with transfer out of bed and ambulation.  ?Medication Inspection Compliance: Pill count conducted under aseptic conditions, in front of the patient. Neither the pills nor the bottle was removed from the patient's sight at any time. Once count was completed pills were immediately returned to the patient in their original bottle. ? ?Medication: Oxycodone IR ?Pill/Patch Count:  5 of 60 patches remain ?Pill/Patch Appearance: Markings consistent with prescribed medication ?Bottle Appearance: Standard pharmacy container. Clearly labeled. ?Filled Date: 77 / 22 / 2022 ?Last Medication intake:  Yesterday ?

## 2021-06-06 NOTE — Progress Notes (Signed)
Patient did not bring BUTRANS patches to count because she is wearing her last one of the prescription. ?

## 2021-06-14 ENCOUNTER — Other Ambulatory Visit: Payer: Self-pay

## 2021-06-14 ENCOUNTER — Ambulatory Visit (INDEPENDENT_AMBULATORY_CARE_PROVIDER_SITE_OTHER): Payer: Medicare Other | Admitting: Dermatology

## 2021-06-14 DIAGNOSIS — L820 Inflamed seborrheic keratosis: Secondary | ICD-10-CM | POA: Diagnosis not present

## 2021-06-14 DIAGNOSIS — L2089 Other atopic dermatitis: Secondary | ICD-10-CM

## 2021-06-14 NOTE — Patient Instructions (Addendum)
Cryotherapy Aftercare ? ?Wash gently with soap and water everyday.   ?Apply Vaseline and Band-Aid daily until healed.  ? ? ?Topical steroids (such as triamcinolone, fluocinolone, fluocinonide, mometasone, clobetasol, halobetasol, betamethasone, hydrocortisone) can cause thinning and lightening of the skin if they are used for too long in the same area. Your physician has selected the right strength medicine for your problem and area affected on the body. Please use your medication only as directed by your physician to prevent side effects.  ? ? ?Dry Skin Care ? ?What causes dry skin? ? ?Dry skin is common and results from inadequate moisture in the outer skin layers. Dry skin usually results from the excessive loss of moisture from the skin surface. This occurs due to two major factors: ?Normally the skin's oil glands deposit a layer of oil on the skin's surface. This layer of oil prevents the loss of moisture from the skin. Exposure to soaps, cleaners, solvents, and disinfectants removes this oily film, allowing water to escape. ?Water loss from the skin increases when the humidity is low. During winter months we spend a lot of time indoors where the air is heated. Heated air has very low humidity. This also contributes to dry skin. ? ?A tendency for dry skin may accompany such disorders as eczema. Also, as people age, the number of functioning oil glands decreases, and the tendency toward dry skin can be a sensation of skin tightness when emerging from the shower. ? ?How do I manage dry skin? ? ?Humidify your environment. This can be accomplished by using a humidifier in your bedroom at night during winter months. ?Bathing can actually put moisture back into your skin if done right. Take the following steps while bathing to sooth dry skin: ?Avoid hot water, which only dries the skin and makes itching worse. Use warm water. ?Avoid washcloths or extensive rubbing or scrubbing. ?Use mild soaps like unscented Dove,  Oil of Olay, Cetaphil, Basis, or CeraVe. ?If you take baths rather than showers, rinse off soap residue with clean water before getting out of tub. ?Once out of the shower/tub, pat dry gently with a soft towel. Leave your skin damp. ?While still damp, apply any medicated ointment/cream you were prescribed to the affected areas. After you apply your medicated ointment/cream, then apply your moisturizer to your whole body.This is the most important step in dry skin care. If this is omitted, your skin will continue to be dry. ?The choice of moisturizer is also very important. In general, lotion will not provider enough moisture to severely dry skin because it is water based. You should use an ointment or cream. Moisturizers should also be unscented. Good choices include Vaseline (plain petrolatum), Aquaphor, Cetaphil, CeraVe, Vanicream, DML Forte, Aveeno moisture, or Eucerin Cream. ?Bath oils can be helpful, but do not replace the application of moisturizer after the bath. In addition, they make the tub slippery causing an increased risk for falls. Therefore, we do not recommend their use. ? ? ?If You Need Anything After Your Visit ? ?If you have any questions or concerns for your doctor, please call our main line at [phone] and press option 4 to reach your doctor's medical assistant. If no one answers, please leave a voicemail as directed and we will return your call as soon as possible. Messages left after 4 pm will be answered the following business day.  ? ?You may also send us a message via MyChart. We typically respond to MyChart messages within 1-2 business days. ? ?  For prescription refills, please ask your pharmacy to contact our office. Our fax number is [phone]. ? ?If you have an urgent issue when the clinic is closed that cannot wait until the next business day, you can page your doctor at the number below.   ? ?Please note that while we do our best to be available for urgent issues outside of  office hours, we are not available 24/7.  ? ?If you have an urgent issue and are unable to reach [place], you may choose to seek medical care at your doctor's office, retail clinic, urgent care center, or emergency room. ? ?If you have a medical emergency, please immediately call 911 or go to the emergency department. ? ?Pager Numbers ? ?- Dr. Gwen Pounds: [phone] ? ?- Dr. Neale Burly: [phone] ? ?- Dr. Roseanne Reno: [phone] ? ?In the event of inclement weather, please call our main line at [phone] for an update on the status of any delays or closures. ? ?Dermatology Medication Tips: ?Please keep the boxes that topical medications come in in order to help keep track of the instructions about where and how to use these. Pharmacies typically print the medication instructions only on the boxes and not directly on the medication tubes.  ? ?If your medication is too expensive, please contact our office at [phone] option 4 or send [place] a message through MyChart.  ? ?We are unable to tell what your co-pay for medications will be in advance as this is different depending on your insurance coverage. However, we may be able to find a substitute medication at lower cost or fill out paperwork to get insurance to cover a needed medication.  ? ?If a prior authorization is required to get your medication covered by your insurance company, please allow [place] 1-2 business days to complete this process. ? ?Drug prices often vary depending on where the prescription is filled and some pharmacies may offer cheaper prices. ? ?The website www.goodrx.com contains coupons for medications through different pharmacies. The prices here do not account for what the cost may be with help from insurance (it may be cheaper with your insurance), but the website can give you the price if you did not use any insurance.  ?- You can print the associated coupon and take it with your prescription to the pharmacy.  ?- You may also stop by our office during  regular business hours and pick up a GoodRx coupon card.  ?- If you need your prescription sent electronically to a different pharmacy, notify our office through [facility] or by phone at [phone] option 4. ? ? ? ? ?Si Usted Necesita Algo Despu?s de Su Visita ? ?Tambi?n puede enviarnos un mensaje a trav?s de MyChart. Por lo general respondemos a los mensajes de MyChart en el transcurso de 1 a 2 d?as h?biles. ? ?Para renovar recetas, por favor pida a su farmacia que se ponga en contacto con nuestra oficina. Nuestro n?mero de fax es el [phone]. ? ?Si tiene un asunto urgente cuando la cl?nica est? cerrada y que no puede esperar hasta el siguiente d?a h?bil, puede llamar/localizar a su doctor(a) al n?mero que aparece a continuaci?n.  ? ?Por favor, tenga en cuenta que aunque hacemos todo lo posible para estar disponibles para asuntos urgentes fuera del horario de oficina, no estamos disponibles las 24 horas del d?a, los 7 d?as de la semana.  ? ?Si tiene un problema urgente y no puede comunicarse con nosotros, puede optar por buscar atenci?n m?dica  en  el consultorio de su doctor(a), en una cl?nica privada, en un centro de atenci?n urgente o en una sala de emergencias. ? ?Si tiene Radio broadcast assistant m?dica, por favor llame inmediatamente al 911 o vaya a la sala de emergencias. ? ?N?meros de b?per ? ?- Dr. Gwen Pounds: [phone] ? ?- Dra. Moye: [phone] ? ?- Dra. Roseanne Reno: [phone] ? ?En caso de inclemencias del tiempo, por favor llame a nuestra l?nea principal al [phone] para una actualizaci?n sobre el estado de cualquier retraso o cierre. ? ?Consejos para la medicaci?n en dermatolog?a: ?Por favor, guarde las cajas en las que vienen los medicamentos de uso t?pico para ayudarle a seguir las instrucciones sobre d?nde y c?mo usarlos. Las farmacias generalmente imprimen las instrucciones del medicamento s?lo en las cajas y no directamente en los tubos del [place].  ? ?Si su medicamento es muy  caro, por favor, p?ngase en contacto con Rolm Gala llamando al [phone] y presione la opci?n 4 o env?enos un mensaje a trav?s de MyChart.  ? ?No podemos decirle cu?l ser? su copago por los medicament

## 2021-06-14 NOTE — Progress Notes (Signed)
? ?  Follow-Up Visit ?  ?Subjective  ?Leslie Carrillo is a 61 y.o. female who presents for the following: Dermatitis (Trunk, extremities, scalp, 1 month follow-up. Improving with Clobetasol/CeraVe cream mixture and hydroxyzine 25mg  prn itch. ) and Intertrigo (R med inframammary, 1 month follow-up. Improved with daughter's Rx clotrimazole-betamethasone cream. Eucrisa ointment was too expensive. ). She has a spot on her left back that came up about 1 1/2 years ago, growing.  ? ? ?The following portions of the chart were reviewed this encounter and updated as appropriate:  ?  ?  ? ?Review of Systems:  No other skin or systemic complaints except as noted in HPI or Assessment and Plan. ? ?Objective  ?Well appearing patient in no apparent distress; mood and affect are within normal limits. ? ?A focused examination was performed including face, trunk, extremities. Relevant physical exam findings are noted in the Assessment and Plan. ? ?trunk; extremities ?Mild erythema on the right antecubitum, otherwise skin is clear ? ?Left Mid Back ?Erythematous stuck-on, waxy papule or plaque ? ? ? ?Assessment & Plan  ?Other atopic dermatitis ?trunk; extremities ? ?Improving ? ?Recommend plain CeraVe Cream or Anti-Itch daily after shower. Continue Dove soap. ? ?Continue Clobetasol/CeraVe mix qd/bid prn flares to AA. Avoid face, groin, axilla.  ? ?Continue hydroxyzine 25mg  1 po qhs prn itch.  ? ?May continue Clotrimazole.Betamethasone Cream (daughter's Rx) or Clobetasol/CeraVe mix on chest prn but not for an extended period of time (up to 2 weeks). Avoid using inframammary. ? ?Topical steroids (such as triamcinolone, fluocinolone, fluocinonide, mometasone, clobetasol, halobetasol, betamethasone, hydrocortisone) can cause thinning and lightening of the skin if they are used for too long in the same area. Your physician has selected the right strength medicine for your problem and area affected on the body. Please use your medication only  as directed by your physician to prevent side effects.  ? ? ?Inflamed seborrheic keratosis ?Left Mid Back ? ?Destruction of lesion - Left Mid Back ? ?Destruction method: cryotherapy   ?Informed consent: discussed and consent obtained   ?Lesion destroyed using liquid nitrogen: Yes   ?Region frozen until ice ball extended beyond lesion: Yes   ?Outcome: patient tolerated procedure well with no complications   ?Post-procedure details: wound care instructions given   ?Additional details:  Prior to procedure, discussed risks of blister formation, small wound, skin dyspigmentation, or rare scar following cryotherapy. Recommend Vaseline ointment to treated areas while healing. ? ? ? ?Return if symptoms worsen or fail to improve. ? ?I, Jamesetta Orleans, CMA, am acting as scribe for Brendolyn Patty, MD . ?Documentation: I have reviewed the above documentation for accuracy and completeness, and I agree with the above. ? ?Brendolyn Patty MD  ? ? ?

## 2021-08-01 ENCOUNTER — Other Ambulatory Visit: Payer: Self-pay | Admitting: Internal Medicine

## 2021-08-01 ENCOUNTER — Ambulatory Visit
Admission: RE | Admit: 2021-08-01 | Discharge: 2021-08-01 | Disposition: A | Discharge status: Home / Self Care | Payer: Medicare Other | Source: Ambulatory Visit | Attending: Internal Medicine | Admitting: Internal Medicine

## 2021-08-01 DIAGNOSIS — M7989 Other specified soft tissue disorders: Secondary | ICD-10-CM | POA: Insufficient documentation

## 2021-08-29 ENCOUNTER — Ambulatory Visit
Payer: Medicare Other | Attending: Student in an Organized Health Care Education/Training Program | Admitting: Student in an Organized Health Care Education/Training Program

## 2021-08-29 ENCOUNTER — Encounter: Payer: Self-pay | Admitting: Student in an Organized Health Care Education/Training Program

## 2021-08-29 VITALS — BP 125/88 | HR 66 | Temp 97.2°F | Resp 16 | Ht 63.0 in | Wt 175.0 lb

## 2021-08-29 DIAGNOSIS — M48061 Spinal stenosis, lumbar region without neurogenic claudication: Secondary | ICD-10-CM | POA: Insufficient documentation

## 2021-08-29 DIAGNOSIS — G894 Chronic pain syndrome: Secondary | ICD-10-CM | POA: Insufficient documentation

## 2021-08-29 DIAGNOSIS — M5416 Radiculopathy, lumbar region: Secondary | ICD-10-CM | POA: Insufficient documentation

## 2021-08-29 MED ORDER — OXYCODONE HCL 5 MG PO TABS: 5.0000 mg | ORAL_TABLET | Freq: Every day | ORAL | 0 refills | Status: AC | PRN

## 2021-08-29 MED ORDER — BUPRENORPHINE 15 MCG/HR TD PTWK: 1.0000 | MEDICATED_PATCH | TRANSDERMAL | 2 refills | Status: DC

## 2021-08-29 NOTE — Progress Notes (Signed)
PROVIDER NOTE: Information contained herein reflects review and annotations entered in association with encounter. Interpretation of such information and data should be left to medically-trained personnel. Information provided to patient can be located elsewhere in the medical record under "Patient Instructions". Document created using STT-dictation technology, any transcriptional errors that may result from process are unintentional.    Patient: Leslie Carrillo  Service Category: E/M  Provider: Gillis Santa, MD  DOB: February 10, 1961  DOS: 08/29/2021  Specialty: Interventional Pain Management  MRN: [id number]  Setting: Ambulatory outpatient  PCP: Gladstone Lighter, MD  Type: Established Patient    Referring Provider: Gladstone Lighter, MD  Location: Office  Delivery: Face-to-face     HPI  [name], a 61 y.o. year old female, is here today because of her Lumbar radicular pain [M54.16]. [name] primary complain today is Back Pain (Upper, lower), Shoulder Pain (right), Leg Pain (right), and Hip Pain (right)   Last encounter: My last encounter with her was on 06/06/21  Pertinent problems: [name] has Chronic low back pain; DDD (degenerative disc disease), thoracic; Lumbar radiculopathy; Cervical facet joint syndrome; Bilateral primary osteoarthritis of knee; Lumbar degenerative disc disease; Chronic pain syndrome; Myofascial pain syndrome; and Lumbar spondylosis on their pertinent problem list. Pain Assessment: Severity of Chronic pain is reported as a 6 /10. Location: Leg Right, Upper, Lower/ . Onset: More than a month ago. Quality: Aching, Throbbing. Timing: Constant. Modifying factor(s): Oxycodone, Tylenol, non weight bearing on righ leg, with elevation. Vitals:  height is $RemoveB'5\' 3"'jdYEKTUu$  (1.6 m) and weight is 175 lb (79.4 kg). Her temporal temperature is 97.2 F (36.2 C) (abnormal). Her blood pressure is 125/88 and her pulse is 66. Her respiration is 16 and oxygen saturation is 98%.   Reason for  encounter: medication management.    Increased low back pain with radiation into   right > left hip and leg Patient states that the pain does radiate down into the. She takes oxycodone strictly on a as needed basis and has been utilizing it more frequently given recent lumbar radicular pain. X-rays from 2020 shows severe facet arthropathy resulting in neuroforaminal stenosis that could be causing radicular pain. Refill Butrans patch and oxycodone as below.  No change in dose.   Pharmacotherapy Assessment  Analgesic: Buprenorphine transdermal patch, 15 mcg an hour, oxycodone 5 mg daily as needed for breakthrough pain, quantity #30 to last for 90 days MME/day: 10 mg/day   Monitoring: [place] PMP: PDMP not reviewed this encounter.       Pharmacotherapy: No side-effects or adverse reactions reported. Compliance: No problems identified. Effectiveness: Clinically acceptable.  UDS:  Summary  Date Value Ref Range Status  03/09/2021 Note  Final    Comment:    ==================================================================== [employer] [facility] 13 (MW) ==================================================================== Test                             Result       Flag       Units  Drug Present and Declared for Prescription Verification   Buprenorphine                  28           EXPECTED   ng/mg creat   Norbuprenorphine               35           EXPECTED   ng/mg creat    Source  of buprenorphine is a scheduled prescription medication.    Norbuprenorphine is an expected metabolite of buprenorphine.  Drug Absent but Declared for Prescription Verification   Oxycodone                      Not Detected UNEXPECTED ng/mg creat ==================================================================== Test                      Result    Flag   Units      Ref Range   Creatinine              153              mg/dL      >=20 ==================================================================== Declared  Medications:  The flagging and interpretation on this report are based on the  following declared medications.  Unexpected results may arise from  inaccuracies in the declared medications.   **Note: The testing scope of this panel includes these medications:   Oxycodone (Roxicodone)   **Note: The testing scope of this panel does not include small to  moderate amounts of these reported medications:   Buprenorphine Patch (BuTrans)   **Note: The testing scope of this panel does not include the  following reported medications:   Acetaminophen (Tylenol)  Ondansetron (Zofran)  Sertraline (Zoloft) ==================================================================== For clinical consultation, please call [phone]. ====================================================================       ROS  Constitutional: Denies any fever or chills Gastrointestinal: No reported hemesis, hematochezia, vomiting, or acute GI distress Musculoskeletal: R> L hip, leg pain Neurological: No reported episodes of acute onset apraxia, aphasia, dysarthria, agnosia, amnesia, paralysis, loss of coordination, or loss of consciousness  Medication Review  Cholecalciferol, acetaminophen, buprenorphine, cloNIDine, clobetasol, hydrOXYzine, oxyCODONE, and sertraline  History Review  Allergy: [name] is allergic to codeine, pregabalin, hydrocodone-acetaminophen, tramadol, latex, lovastatin, and simvastatin. Drug: [name]  reports no history of drug use. Alcohol:  reports no history of alcohol use. Tobacco:  reports that she has been smoking cigarettes. She has a 10.00 pack-year smoking history. She has never used smokeless tobacco. Social: [name]  reports that she has been smoking cigarettes. She has a 10.00 pack-year smoking history. She has never used smokeless tobacco. She reports that she does not drink alcohol and does not use drugs. Medical:  has a past medical history of Anemia, Arthritis, Asthma,  Chronic kidney disease, Chronic pain, and History of kidney stones. Surgical: [name]  has a past surgical history that includes Cervical fusion (2014); carpel tunnel (Right); Cholecystectomy; c sections (N/A, 1985); Colonoscopy; and Shoulder arthroscopy with subacromial decompression, rotator cuff repair and bicep tendon repair (Right, 11/08/2020). Family: family history includes Dementia in her mother; Prostate cancer in her father; Renal Cyst in her father.  Laboratory Chemistry Profile   Renal Lab Results  Component Value Date   BUN 11 11/08/2020   CREATININE 0.97 11/08/2020   GFRAA >60 10/16/2019   GFRNONAA >60 11/08/2020     Hepatic Lab Results  Component Value Date   AST 20 10/16/2019   ALT 15 10/16/2019   ALBUMIN 4.0 10/16/2019   ALKPHOS 75 10/16/2019   LIPASE 29 10/16/2019     Electrolytes Lab Results  Component Value Date   NA 137 11/08/2020   K 3.9 11/08/2020   CL 102 11/08/2020   CALCIUM 9.0 11/08/2020     Bone No results found for: "VD25OH", "VD125OH2TOT", "QJ3354TG2", "BW3893TD4", "25OHVITD1", "25OHVITD2", "25OHVITD3", "TESTOFREE", "TESTOSTERONE"   Inflammation (CRP: Acute Phase) (ESR:  Chronic Phase) No results found for: "CRP", "ESRSEDRATE", "LATICACIDVEN"     Note: Above Lab results reviewed.  Recent Imaging Review  US Venous Img Lower Unilateral Right (DVT) CLINICAL DATA:  Right lower extremity swelling  EXAM: RIGHT LOWER EXTREMITY VENOUS DOPPLER ULTRASOUND  TECHNIQUE: Gray-scale sonography with compression, as well as color and duplex ultrasound, were performed to evaluate the deep venous system(s) from the level of the common femoral vein through the popliteal and proximal calf veins.  COMPARISON:  None Available.  FINDINGS: VENOUS  Normal compressibility of the common femoral, superficial femoral, and popliteal veins, as well as the visualized calf veins. Visualized portions of profunda femoral vein and great saphenous vein  unremarkable. No filling defects to suggest DVT on grayscale or color Doppler imaging. Doppler waveforms show normal direction of venous flow, normal respiratory plasticity and response to augmentation.  Limited views of the contralateral common femoral vein are unremarkable.  OTHER  Mildly complex fluid collection the posteromedial popliteal fossa measuring 4.6 x 2.2 by 3.7 cm consistent with a Baker's cyst.  Limitations: none  IMPRESSION: 1. No evidence right lower extremity DVT. 2. Mildly complex Baker's cyst.  Electronically Signed   By: Jacqulynn Cadet M.D.   On: 08/01/2021 15:39  CLINICAL DATA:  Low back pain extending into the hips bilaterally.   EXAM: RIGHT KNEE - 1-2 VIEW; LEFT KNEE - 1-2 VIEW; DG HIP (WITH OR WITHOUT PELVIS) 2-3V LEFT; LUMBAR SPINE - COMPLETE WITH BENDING VIEWS; DG HIP (WITH OR WITHOUT PELVIS) 2-3V RIGHT   COMPARISON:  MRI lumbar spine 01/09/2012   FINDINGS: Non rib-bearing lumbar type vertebral bodies are present. Progressive levoconvex curvature of the lumbar spine is centered at L2-3. Compensatory rightward curvature is present at L5. Slight retrolisthesis is present at L2-3 and L3-4. There is slight anterolisthesis at L4-5. Marked facet hypertrophy is present from L3-4 through L5-S1.   Atherosclerotic calcifications are present in the aorta.   There is no significant change in listhesis associated with flexion or extension.   IMPRESSION: 1. 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 2. Retrolisthesis at L3-4 and anterolisthesis at L4-5 is new. 3. Significant facet hypertrophy at L3-4 and L4-5 in particular may contribute to radicular symptoms.     Electronically Signed   By: San Morelle M.D.   On: 04/01/2019 06:02  Note: Reviewed        Physical Exam  General appearance: Well nourished, well developed, and well hydrated. In no apparent acute distress Mental status: Alert, oriented x 3 (person, place, &  time)       Respiratory: No evidence of acute respiratory distress Eyes: PERLA Vitals: BP 125/88   Pulse 66   Temp (!) 97.2 F (36.2 C) (Temporal)   Resp 16   Ht $R'5\' 3"'aQ$  (1.6 m)   Wt 175 lb (79.4 kg)   SpO2 98%   BMI 31.00 kg/m  BMI: Estimated body mass index is 31 kg/m as calculated from the following:   Height as of this encounter: $RemoveBeforeD'5\' 3"'XtUnwgsXuGNKwA$  (1.6 m).   Weight as of this encounter: 175 lb (79.4 kg). Ideal: Ideal body weight: 52.4 kg (115 lb 8.3 oz) Adjusted ideal body weight: 63.2 kg (139 lb 5 oz)  Lumbar Spine Area Exam  Skin & Axial Inspection: No masses, redness, or swelling Alignment: Symmetrical Functional ROM: Pain restricted ROM affecting both sides right greater than left Stability: No instability detected Muscle Tone/Strength: Functionally intact. No obvious neuro-muscular anomalies detected. Sensory (Neurological): Dermatomal pain pattern right greater than  left  Gait & Posture Assessment  Ambulation: Unassisted Gait: Relatively normal for age and body habitus Posture: WNL  Lower Extremity Exam    Side: Right lower extremity  Side: Left lower extremity  Stability: No instability observed          Stability: No instability observed          Skin & Extremity Inspection: Skin color, temperature, and hair growth are WNL. No peripheral edema or cyanosis. No masses, redness, swelling, asymmetry, or associated skin lesions. No contractures.  Skin & Extremity Inspection: Skin color, temperature, and hair growth are WNL. No peripheral edema or cyanosis. No masses, redness, swelling, asymmetry, or associated skin lesions. No contractures.  Functional ROM: Pain restricted ROM for hip and knee joints          Functional ROM: Unrestricted ROM                  Muscle Tone/Strength: Functionally intact. No obvious neuro-muscular anomalies detected.  Muscle Tone/Strength: Functionally intact. No obvious neuro-muscular anomalies detected.  Sensory (Neurological): Dermatomal pain pattern         Sensory (Neurological): Unimpaired        DTR: Patellar: deferred today Achilles: deferred today Plantar: deferred today  DTR: Patellar: deferred today Achilles: deferred today Plantar: deferred today  Palpation: No palpable anomalies  Palpation: No palpable anomalies     Assessment   Status Diagnosis  Having a Flare-up Persistent Having a Flare-up 1. Lumbar radicular pain   2. 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 Lumbar radiculopathy   4. Chronic pain syndrome         Plan of Care   [name] has a current medication list which includes the following long-term medication(s): clonidine and sertraline.  1. Lumbar radicular pain - buprenorphine (BUTRANS) 15 MCG/HR; Place 1 patch onto the skin once a week. For chronic pain syndrome  Dispense: 4 patch; Refill: 2 - oxyCODONE (OXY IR/ROXICODONE) 5 MG immediate release tablet; Take 1 tablet (5 mg total) by mouth daily as needed for severe pain. Must last 30 days.  Dispense: 30 tablet; Refill: 0 - Lumbar Epidural Injection; Future  2. 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 For chronic pain syndrome  Dispense: 4 patch; Refill: 2 - oxyCODONE (OXY IR/ROXICODONE) 5 MG immediate release tablet; Take 1 tablet (5 mg total) by mouth daily as needed for severe pain. Must last 30 days.  Dispense: 30 tablet; Refill: 0 - Lumbar Epidural Injection; Future  3. Lumbar radiculopathy - buprenorphine (BUTRANS) 15 MCG/HR; Place 1 patch onto the skin once a week. For chronic pain syndrome  Dispense: 4 patch; Refill: 2 - oxyCODONE (OXY IR/ROXICODONE) 5 MG immediate release tablet; Take 1 tablet (5 mg total) by mouth daily as needed for severe pain. Must last 30 days.  Dispense: 30 tablet; Refill: 0 - Lumbar Epidural Injection; Future  4. Chronic pain syndrome - buprenorphine (BUTRANS) 15 MCG/HR; Place 1 patch onto the skin  once a week. For chronic pain syndrome  Dispense: 4 patch; Refill: 2 - oxyCODONE (OXY IR/ROXICODONE) 5 MG immediate release tablet; Take 1 tablet (5 mg total) by mouth daily as needed for severe pain. Must last 30 days.  Dispense: 30 tablet; Refill: 0 - Lumbar Epidural Injection; Future    Pharmacotherapy (Medications Ordered): Meds ordered this encounter  Medications   buprenorphine (BUTRANS) 15 MCG/HR  Sig: Place 1 patch onto the skin once a week. For chronic pain syndrome    Dispense:  4 patch    Refill:  2   oxyCODONE (OXY IR/ROXICODONE) 5 MG immediate release tablet    Sig: Take 1 tablet (5 mg total) by mouth daily as needed for severe pain. Must last 30 days.    Dispense:  30 tablet    Refill:  0    Chronic Pain: STOP Act (Not applicable) Fill 1 day early if closed on refill date. Avoid benzodiazepines within 8 hours of opioids   Orders Placed This Encounter  Procedures   Lumbar Epidural Injection    Standing Status:   Future    Standing Expiration Date:   09/28/2021    Scheduling Instructions:     Procedure: Interlaminar Lumbar Epidural Steroid injection (LESI)            Laterality: Midline- Right L4/5     Sedation: without     Timeframe: ASAA    Order Specific Question:   Where will this procedure be performed?    Answer:   [facility]     Follow-up plan:   Return in about 15 days (around 09/13/2021) for Right L4/5 ESI , in clinic [facility].    Recent Visits Date Type Provider Dept  06/06/21 Office Visit Gillis Santa, MD [facility]  Showing recent visits within past 90 days and meeting all other requirements Today's Visits Date Type Provider Dept  08/29/21 Office Visit Gillis Santa, MD [facility]  Showing today's visits and meeting all other requirements Future Appointments Date Type Provider Dept  09/13/21 Appointment Gillis Santa, MD [facility]  11/21/21 Appointment Gillis Santa, MD [facility]  Showing  future appointments within next 90 days and meeting all other requirements  I discussed the assessment and treatment plan with the patient. The patient was provided an opportunity to ask questions and all were answered. The patient agreed with the plan and demonstrated an understanding of the instructions.  Patient advised to call back or seek an in-person evaluation if the symptoms or condition worsens.  Duration of encounter:106minutes.  Note by: Gillis Santa, MD Date: 08/29/2021; Time: 11:00 AM

## 2021-08-29 NOTE — Patient Instructions (Signed)

## 2021-08-29 NOTE — Progress Notes (Signed)
Nursing Pain Medication Assessment:  Safety precautions to be maintained throughout the outpatient stay will include: orient to surroundings, keep bed in low position, maintain call bell within reach at all times, provide assistance with transfer out of bed and ambulation.  Medication Inspection Compliance: Pill count conducted under aseptic conditions, in front of the patient. Neither the pills nor the bottle was removed from the patient's sight at any time. Once count was completed pills were immediately returned to the patient in their original bottle.  Medication: Oxycodone IR Pill/Patch Count:  0 of 30 pills remain Pill/Patch Appearance: Markings consistent with prescribed medication Bottle Appearance: Standard pharmacy container. Clearly labeled. Filled Date: 03 / 21 / 2023 Last Medication intake:  Yesterday Safety precautions to be maintained throughout the outpatient stay will include: orient to surroundings, keep bed in low position, maintain call bell within reach at all times, provide assistance with transfer out of bed and ambulation.   Did not bring Buprenorphine box

## 2021-09-05 ENCOUNTER — Other Ambulatory Visit: Payer: Self-pay | Admitting: *Deleted

## 2021-09-05 DIAGNOSIS — M25569 Pain in unspecified knee: Secondary | ICD-10-CM

## 2021-09-12 ENCOUNTER — Encounter: Payer: Self-pay | Admitting: Vascular Surgery

## 2021-09-12 ENCOUNTER — Ambulatory Visit (HOSPITAL_COMMUNITY)
Admission: RE | Admit: 2021-09-12 | Discharge: 2021-09-12 | Disposition: A | Discharge status: Home / Self Care | Payer: Medicare Other | Source: Ambulatory Visit | Attending: Vascular Surgery | Admitting: Vascular Surgery

## 2021-09-12 ENCOUNTER — Ambulatory Visit (INDEPENDENT_AMBULATORY_CARE_PROVIDER_SITE_OTHER): Payer: Medicare Other | Admitting: Vascular Surgery

## 2021-09-12 DIAGNOSIS — I872 Venous insufficiency (chronic) (peripheral): Secondary | ICD-10-CM

## 2021-09-12 DIAGNOSIS — M25569 Pain in unspecified knee: Secondary | ICD-10-CM | POA: Insufficient documentation

## 2021-09-12 NOTE — Progress Notes (Signed)
Patient name: Leslie Carrillo MRN: [id number] DOB: August 20, 1960 Sex: female  REASON FOR CONSULT: Varicose veins/leg swelling  HPI: Leslie Carrillo is a 61 y.o. female, with history of chronic pain that presents for evaluation of varicose veins and leg swelling.  This is more notable in the right leg and she states has been ongoing for the last 3 months.  Also describes a deep tooth ache from her hip all the way into the foot.  She is not wearing compression stockings.  No history of DVT.  States she really does not want to wear compression stockings in the summer.  Past Medical History:  Diagnosis Date   Anemia    Arthritis    Asthma    Chronic kidney disease    Chronic pain    History of kidney stones     Past Surgical History:  Procedure Laterality Date   c sections N/A 1985   carpel tunnel Right    CERVICAL FUSION  2014   CHOLECYSTECTOMY     COLONOSCOPY     SHOULDER ARTHROSCOPY WITH SUBACROMIAL DECOMPRESSION, ROTATOR CUFF REPAIR AND BICEP TENDON REPAIR Right 11/08/2020   Procedure: RIGHT SHOULDER ARTHROSCOPY WITH DEBRIDEMENT, DECOMPRESSION, ARTHROSCOPIC ROTATOR CUFF REPAIR, MINI OPEN ROTATOR CUFF REPAIR WITH DERMAL ALLOGRAFT;  Surgeon: Christena Flake, MD;  Location: [facility];  Service: Orthopedics;  Laterality: Right;    Family History  Problem Relation Age of Onset   Dementia Mother    Renal Cyst Father    Prostate cancer Father    Mental illness Neg Hx     SOCIAL HISTORY: Social History   Socioeconomic History   Marital status: Married    Spouse name: Not on file   Number of children: 2   Years of education: Not on file   Highest education level: 12th grade  Occupational History   Occupation: disabled  Tobacco Use   Smoking status: Every Day    Packs/day: 0.50    Years: 20.00    Total pack years: 10.00    Types: Cigarettes   Smokeless tobacco: Never  Vaping Use   Vaping Use: Never used  Substance and Sexual Activity   Alcohol use: Never   Drug use: Never    Sexual activity: Not Currently  Other Topics Concern   Not on file  Social History Narrative   Not on file   Social Determinants of Health   Financial Resource Strain: Not on file  Food Insecurity: Not on file  Transportation Needs: Not on file  Physical Activity: Not on file  Stress: Not on file  Social Connections: Not on file  Intimate Partner Violence: Not on file    Allergies  Allergen Reactions   Codeine Nausea And Vomiting and Anaphylaxis    When taken in a cough syrup   Pregabalin Palpitations    Rapid heart beat Rapid heart beat   Hydrocodone-Acetaminophen Rash    Rashes all over her body    Tramadol Rash    At high dosage    Latex Itching   Lovastatin Other (See Comments)    "i just feel ill"   Simvastatin Other (See Comments)    "i just feel ill"    Current Outpatient Medications  Medication Sig Dispense Refill   acetaminophen (TYLENOL) 500 MG tablet Take 1,000 mg by mouth every 8 (eight) hours as needed for moderate pain.     buprenorphine (BUTRANS) 15 MCG/HR Place 1 patch onto the skin once a week. For chronic pain  syndrome 4 patch 2   Cholecalciferol 25 MCG (1000 UT) tablet Take by mouth.     clobetasol (TEMOVATE) 0.05 % external solution Patient to mix clobetasol solution in jar of CeraVe Cream. Apply all over twice daily up to 4 weeks until itchy rash improved. Avoid face, groin, axilla. 50 mL 1   cloNIDine (CATAPRES) 0.1 MG tablet Take 0.1 mg by mouth at bedtime.     oxyCODONE (OXY IR/ROXICODONE) 5 MG immediate release tablet Take 1 tablet (5 mg total) by mouth daily as needed for severe pain. Must last 30 days. 30 tablet 0   sertraline (ZOLOFT) 100 MG tablet Take 100 mg by mouth daily.     hydrOXYzine (ATARAX) 25 MG tablet Take 25 mg by mouth daily. (Patient not taking: Reported on 09/12/2021)     No current facility-administered medications for this visit.    REVIEW OF SYSTEMS:  [X]  denotes positive finding, [ ]  denotes negative finding Cardiac   Comments:  Chest pain or chest pressure:    Shortness of breath upon exertion:    Short of breath when lying flat:    Irregular heart rhythm:        Vascular    Pain in calf, thigh, or hip brought on by ambulation:    Pain in feet at night that wakes you up from your sleep:     Blood clot in your veins:    Leg swelling:  x Right       Pulmonary    Oxygen at home:    Productive cough:     Wheezing:         Neurologic    Sudden weakness in arms or legs:     Sudden numbness in arms or legs:     Sudden onset of difficulty speaking or slurred speech:    Temporary loss of vision in one eye:     Problems with dizziness:         Gastrointestinal    Blood in stool:     Vomited blood:         Genitourinary    Burning when urinating:     Blood in urine:        Psychiatric    Major depression:         Hematologic    Bleeding problems:    Problems with blood clotting too easily:        Skin    Rashes or ulcers:        Constitutional    Fever or chills:      PHYSICAL EXAM: Vitals:   09/12/21 1502  BP: 125/82  Pulse: (!) 57  Resp: 16  Temp: (!) 97.5 F (36.4 C)  TempSrc: Temporal  SpO2: 95%  Weight: 174 lb (78.9 kg)  Height: 5\' 3"  (1.6 m)    GENERAL: The patient is a well-nourished female, in no acute distress. The vital signs are documented above. CARDIAC: There is a regular rate and rhythm.  VASCULAR:  Bilateral femoral pulses palpable Bilateral PT pulses palpable Right leg edema mildly noticeable today Mostly spider veins in the right lower extremity with 1 small varicosity on the back of the calf and the right leg PULMONARY: No respiratory distress. ABDOMEN: Soft and non-tender.  MUSCULOSKELETAL: There are no major deformities or cyanosis. NEUROLOGIC: No focal weakness or paresthesias are detected. SKIN: There are no ulcers or rashes noted. PSYCHIATRIC: The patient has a normal affect.  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     Performing Technologist: Dorthula Matas RVS, RCS      Examination Guidelines: A complete evaluation includes B-mode imaging,  spectral  Doppler, color Doppler, and power Doppler as needed of all accessible  portions  of each vessel. Bilateral testing is considered an integral part of a  complete  examination. Limited examinations for reoccurring indications may be  performed  as noted. The reflux portion of the exam is performed with the patient in  reverse Trendelenburg.  Significant venous reflux is defined as >500 ms in the superficial venous  system, and >1 second in the deep venous system.      Venous Reflux Times  +--------------+---------+------+-----------+------------+--------------+  RIGHT         Reflux NoRefluxReflux TimeDiameter cmsComments                                Yes                                         +--------------+---------+------+-----------+------------+--------------+  CFV                     yes   >1 second                             +--------------+---------+------+-----------+------------+--------------+  FV mid        no                                                    +--------------+---------+------+-----------+------------+--------------+  Popliteal     no                                    popliteal cyst  +--------------+---------+------+-----------+------------+--------------+  GSV at [facility]    no                            0.58                     +--------------+---------+------+-----------+------------+--------------+  GSV prox thigh          yes    >500 ms      0.28                    +--------------+---------+------+-----------+------------+--------------+  GSV mid thigh           yes    >500 ms      0.22    small branch    +--------------+---------+------+-----------+------------+--------------+  GSV dist thighno                            0.28                    +--------------+---------+------+-----------+------------+--------------+  GSV at knee   no                            0.28                    +--------------+---------+------+-----------+------------+--------------+  SSV Pop Fossa no                            0.30                    +--------------+---------+------+-----------+------------+--------------+         Summary:  Right:  - No evidence of deep vein thrombosis from the common femoral through the  popliteal veins.  - No evidence of superficial venous thrombosis.  - The common femoral vein is not competent.  - The great saphenous vein is not competent.  - The small saphenous vein is competent.     *See table(s) above for measurements and observations.   Assessment/Plan:  61 year old female presents for evaluation of leg swelling and varicose veins worse in the right leg.  I discussed that she has no evidence of DVT.  Reflux study does confirm evidence of venous insufficiency in a short segment of the great saphenous vein as well as the common femoral vein.  Ultimately recommended leg elevation with compression stockings and exercise.  She is really not a candidate for any laser ablation given she has no reflux at the [facility] and the great saphenous vein is very small only measuring about 2 mm.  We got her sized for knee-high medical grade compression stockings today with 20-30 mm Hg.  She can follow-up with me as needed.  Also discussed that she has easily palpable right femoral  and posterior tibial pulse at the ankle and I do not think her leg pain is related to arterial insufficiency.  I also do not think her venous insufficiency with leg swelling would explain the level of pain she is having.  Discussed she should look into other etiologies like her back or hip etc.   Cephus Shelling, MD [facility] Office: [phone]

## 2021-09-13 ENCOUNTER — Ambulatory Visit
Payer: Medicare Other | Attending: Student in an Organized Health Care Education/Training Program | Admitting: Student in an Organized Health Care Education/Training Program

## 2021-09-13 ENCOUNTER — Encounter: Payer: Self-pay | Admitting: Student in an Organized Health Care Education/Training Program

## 2021-09-13 ENCOUNTER — Ambulatory Visit
Admission: RE | Admit: 2021-09-13 | Discharge: 2021-09-13 | Disposition: A | Discharge status: Home / Self Care | Payer: Medicare Other | Source: Ambulatory Visit | Attending: Student in an Organized Health Care Education/Training Program | Admitting: Student in an Organized Health Care Education/Training Program

## 2021-09-13 VITALS — BP 149/84 | HR 65 | Temp 98.5°F | Resp 16 | Ht 63.0 in | Wt 147.0 lb

## 2021-09-13 DIAGNOSIS — M48061 Spinal stenosis, lumbar region without neurogenic claudication: Secondary | ICD-10-CM | POA: Diagnosis present

## 2021-09-13 DIAGNOSIS — M5416 Radiculopathy, lumbar region: Secondary | ICD-10-CM | POA: Diagnosis present

## 2021-09-13 DIAGNOSIS — G894 Chronic pain syndrome: Secondary | ICD-10-CM

## 2021-09-13 MED ORDER — DEXAMETHASONE SODIUM PHOSPHATE 10 MG/ML IJ SOLN
INTRAMUSCULAR | Status: AC
  Filled 2021-09-13: qty 1

## 2021-09-13 MED ORDER — ROPIVACAINE HCL 2 MG/ML IJ SOLN
2.0000 mL | Freq: Once | INTRAMUSCULAR | Status: AC
  Administered 2021-09-13: 2 mL via EPIDURAL

## 2021-09-13 MED ORDER — ROPIVACAINE HCL 2 MG/ML IJ SOLN
INTRAMUSCULAR | Status: AC
  Filled 2021-09-13: qty 20

## 2021-09-13 MED ORDER — IOHEXOL 180 MG/ML  SOLN
10.0000 mL | Freq: Once | INTRAMUSCULAR | Status: AC
  Administered 2021-09-13: 5 mL via EPIDURAL

## 2021-09-13 MED ORDER — LIDOCAINE HCL 2 % IJ SOLN
20.0000 mL | Freq: Once | INTRAMUSCULAR | Status: AC
  Administered 2021-09-13: 100 mg

## 2021-09-13 MED ORDER — DEXAMETHASONE SODIUM PHOSPHATE 10 MG/ML IJ SOLN
10.0000 mg | Freq: Once | INTRAMUSCULAR | Status: AC
  Administered 2021-09-13: 10 mg

## 2021-09-13 MED ORDER — LIDOCAINE HCL (PF) 2 % IJ SOLN
INTRAMUSCULAR | Status: AC
  Filled 2021-09-13: qty 10

## 2021-09-13 MED ORDER — SODIUM CHLORIDE (PF) 0.9 % IJ SOLN
INTRAMUSCULAR | Status: AC
  Filled 2021-09-13: qty 10

## 2021-09-13 MED ORDER — SODIUM CHLORIDE 0.9% FLUSH
2.0000 mL | Freq: Once | INTRAVENOUS | Status: AC
  Administered 2021-09-13: 2 mL

## 2021-09-13 NOTE — Progress Notes (Signed)
PROVIDER NOTE: Interpretation of information contained herein should be left to medically-trained personnel. Specific patient instructions are provided elsewhere under "Patient Instructions" section of medical record. This document was created in part using STT-dictation technology, any transcriptional errors that may result from this process are unintentional.  Patient: Leslie Carrillo Type: Established DOB: 1960-09-03 MRN: [id number] PCP: Enid Baas, MD  Service: Procedure DOS: 09/13/2021 Setting: Ambulatory Location: Ambulatory outpatient facility Delivery: Face-to-face Provider: Edward Jolly, MD Specialty: Interventional Pain Management Specialty designation: 09 Location: Outpatient facility Ref. Prov.: Enid Baas, MD    Primary Reason for Visit: Interventional Pain Management Treatment. CC: Leg Pain (right) and Back Pain   Procedure:           Type: Lumbar epidural steroid injection (LESI) (interlaminar) #1    Laterality: Right   Level:  L4-5 Level.  Imaging: Fluoroscopic guidance Anesthesia: Local anesthesia (1-2% Lidocaine) Anxiolysis: None                 Sedation: None. DOS: 09/13/2021  Performed by: Edward Jolly, MD  Purpose: Diagnostic/Therapeutic Indications: Lumbar radicular pain of intraspinal etiology of more than 4 weeks that has failed to respond to conservative therapy and is severe enough to impact quality of life or function. 1. Lumbar radicular pain   2. 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 Lumbar radiculopathy   4. Chronic pain syndrome    NAS-11 Pain score:   Pre-procedure: 6 /10   Post-procedure: 3 /10      Position / Prep / Materials:  Position: Prone w/ head of the table raised (slight reverse trendelenburg) to facilitate breathing.  Prep solution: DuraPrep (Iodine Povacrylex [0.7% available iodine] and Isopropyl Alcohol, 74% w/w) Prep Area: Entire Posterior Lumbar Region from lower scapular tip down to mid  buttocks area and from flank to flank. Materials:  Tray: Epidural tray Needle(s):  Type: Epidural needle (Tuohy) Gauge (G):  22 Length: Regular (3.5-in) Qty: 1  Pre-op H&P Assessment:  Leslie Carrillo is a 61 y.o. (year old), female patient, seen today for interventional treatment. She  has a past surgical history that includes Cervical fusion (2014); carpel tunnel (Right); Cholecystectomy; c sections (N/A, 1985); Colonoscopy; and Shoulder arthroscopy with subacromial decompression, rotator cuff repair and bicep tendon repair (Right, 11/08/2020). Leslie Carrillo has a current medication list which includes the following prescription(s): acetaminophen, buprenorphine, cholecalciferol, clobetasol, clonidine, oxycodone, sertraline, and hydroxyzine. Her primarily concern today is the Leg Pain (right) and Back Pain  Initial Vital Signs:  Pulse/HCG Rate: 65 ECG Heart Rate: (!) 56 Temp: 98.5 F (36.9 C) Resp: 16 BP: 121/74 SpO2: 98 %  BMI: Estimated body mass index is 26.04 kg/m as calculated from the following:   Height as of this encounter: 5\' 3"  (1.6 m).   Weight as of this encounter: 147 lb (66.7 kg).  Risk Assessment: Allergies: Reviewed. She is allergic to codeine, pregabalin, hydrocodone-acetaminophen, tramadol, latex, lovastatin, and simvastatin.  Allergy Precautions: None required Coagulopathies: Reviewed. None identified.  Blood-thinner therapy: None at this time Active Infection(s): Reviewed. None identified. Leslie Carrillo is afebrile  Site Confirmation: Leslie Carrillo was asked to confirm the procedure and laterality before marking the site Procedure checklist: Completed Consent: Before the procedure and under the influence of no sedative(s), amnesic(s), or anxiolytics, the patient was informed of the treatment options, risks and possible complications. To fulfill our ethical and legal obligations, as recommended by the American Medical Association's Code of Ethics, I have informed the patient of my  clinical impression;  the nature and purpose of the treatment or procedure; the risks, benefits, and possible complications of the intervention; the alternatives, including doing nothing; the risk(s) and benefit(s) of the alternative treatment(s) or procedure(s); and the risk(s) and benefit(s) of doing nothing. The patient was provided information about the general risks and possible complications associated with the procedure. These may include, but are not limited to: failure to achieve desired goals, infection, bleeding, organ or nerve damage, allergic reactions, paralysis, and death. In addition, the patient was informed of those risks and complications associated to Spine-related procedures, such as failure to decrease pain; infection (i.e.: Meningitis, epidural or intraspinal abscess); bleeding (i.e.: epidural hematoma, subarachnoid hemorrhage, or any other type of intraspinal or peri-dural bleeding); organ or nerve damage (i.e.: Any type of peripheral nerve, nerve root, or spinal cord injury) with subsequent damage to sensory, motor, and/or autonomic systems, resulting in permanent pain, numbness, and/or weakness of one or several areas of the body; allergic reactions; (i.e.: anaphylactic reaction); and/or death. Furthermore, the patient was informed of those risks and complications associated with the medications. These include, but are not limited to: allergic reactions (i.e.: anaphylactic or anaphylactoid reaction(s)); adrenal axis suppression; blood sugar elevation that in diabetics may result in ketoacidosis or comma; water retention that in patients with history of congestive heart failure may result in shortness of breath, pulmonary edema, and decompensation with resultant heart failure; weight gain; swelling or edema; medication-induced neural toxicity; particulate matter embolism and blood vessel occlusion with resultant organ, and/or nervous system infarction; and/or aseptic necrosis of one or  more joints. Finally, the patient was informed that Medicine is not an exact science; therefore, there is also the possibility of unforeseen or unpredictable risks and/or possible complications that may result in a catastrophic outcome. The patient indicated having understood very clearly. We have given the patient no guarantees and we have made no promises. Enough time was given to the patient to ask questions, all of which were answered to the patient's satisfaction. Ms. Grayer has indicated that she wanted to continue with the procedure. Attestation: I, the ordering provider, attest that I have discussed with the patient the benefits, risks, side-effects, alternatives, likelihood of achieving goals, and potential problems during recovery for the procedure that I have provided informed consent. Date  Time: 09/13/2021 10:24 AM  Pre-Procedure Preparation:  Monitoring: As per clinic protocol. Respiration, ETCO2, SpO2, BP, heart rate and rhythm monitor placed and checked for adequate function Safety Precautions: Patient was assessed for positional comfort and pressure points before starting the procedure. Time-out: I initiated and conducted the "Time-out" before starting the procedure, as per protocol. The patient was asked to participate by confirming the accuracy of the "Time Out" information. Verification of the correct person, site, and procedure were performed and confirmed by me, the nursing staff, and the patient. "Time-out" conducted as per Joint Commission's Universal Protocol (UP.01.01.01). Time: 1059  Description/Narrative of Procedure:          Target: Epidural space via interlaminar opening, initially targeting the lower laminar border of the superior vertebral body. Region: Lumbar Approach: Percutaneous paravertebral  Rationale (medical necessity): procedure needed and proper for the diagnosis and/or treatment of the patient's medical symptoms and needs. Procedural Technique Safety  Precautions: Aspiration looking for blood return was conducted prior to all injections. At no point did we inject any substances, as a needle was being advanced. No attempts were made at seeking any paresthesias. Safe injection practices and needle disposal techniques used. Medications properly checked for  expiration dates. SDV (single dose vial) medications used. Description of the Procedure: Protocol guidelines were followed. The procedure needle was introduced through the skin, ipsilateral to the reported pain, and advanced to the target area. Bone was contacted and the needle walked caudad, until the lamina was cleared. The epidural space was identified using "loss-of-resistance technique" with 2-3 ml of PF-NaCl (0.9% NSS), in a 5cc LOR glass syringe.   6 cc solution made of 3 cc of preservative-free saline, 2 cc of 0.2% ropivacaine, 1 cc of Decadron 10 mg/cc.   Vitals:   09/13/21 1054 09/13/21 1059 09/13/21 1105 09/13/21 1106  BP: (!) 141/84 131/85 134/88 (!) 149/84  Pulse:      Resp: 18 18 18 16   Temp:      TempSrc:      SpO2: 98% 97% 97% 97%  Weight:      Height:        Start Time: 1059 hrs. End Time: 1106 hrs.  Imaging Guidance (Spinal):          Type of Imaging Technique: Fluoroscopy Guidance (Spinal) Indication(s): Assistance in needle guidance and placement for procedures requiring needle placement in or near specific anatomical locations not easily accessible without such assistance. Exposure Time: Please see nurses notes. Contrast: Before injecting any contrast, we confirmed that the patient did not have an allergy to iodine, shellfish, or radiological contrast. Once satisfactory needle placement was completed at the desired level, radiological contrast was injected. Contrast injected under live fluoroscopy. No contrast complications. See chart for type and volume of contrast used. Fluoroscopic Guidance: I was personally present during the use of fluoroscopy. "Tunnel Vision  Technique" used to obtain the best possible view of the target area. Parallax error corrected before commencing the procedure. "Direction-depth-direction" technique used to introduce the needle under continuous pulsed fluoroscopy. Once target was reached, antero-posterior, oblique, and lateral fluoroscopic projection used confirm needle placement in all planes. Images permanently stored in EMR. Interpretation: I personally interpreted the imaging intraoperatively. Adequate needle placement confirmed in multiple planes. Appropriate spread of contrast into desired area was observed. No evidence of afferent or efferent intravascular uptake. No intrathecal or subarachnoid spread observed. Permanent images saved into the patient's record.  Antibiotic Prophylaxis:   Anti-infectives (From admission, onward)    None      Indication(s): None identified  Post-operative Assessment:  Post-procedure Vital Signs:  Pulse/HCG Rate: 65 (!) 53 Temp: 98.5 F (36.9 C) Resp: 16 BP:  (!) 149/84 SpO2: 97 %  EBL: None  Complications: No immediate post-treatment complications observed by team, or reported by patient.  Note: The patient tolerated the entire procedure well. A repeat set of vitals were taken after the procedure and the patient was kept under observation following institutional policy, for this type of procedure. Post-procedural neurological assessment was performed, showing return to baseline, prior to discharge. The patient was provided with post-procedure discharge instructions, including a section on how to identify potential problems. Should any problems arise concerning this procedure, the patient was given instructions to immediately contact , at any time, without hesitation. In any case, we plan to contact the patient by telephone for a follow-up status report regarding this interventional procedure.  Comments:  No additional relevant information.  Plan of Care  Orders:  Orders Placed  This Encounter  Procedures   DG PAIN CLINIC C-ARM 1-60 MIN NO REPORT    Intraoperative interpretation by procedural physician at [facility].    Standing Status:   Standing  Number of Occurrences:   1    Order Specific Question:   Reason for exam:    Answer:   Assistance in needle guidance and placement for procedures requiring needle placement in or near specific anatomical locations not easily accessible without such assistance.   Chronic Opioid Analgesic:  Buprenorphine transdermal patch, 15 mcg an hour, oxycodone 5 mg daily as needed for breakthrough pain, quantity #30 to last for 90 days MME/day: 10 mg/day   Medications ordered for procedure: Meds ordered this encounter  Medications   iohexol (OMNIPAQUE) 180 MG/ML injection 10 mL    Must be Myelogram-compatible. If not available, you may substitute with a water-soluble, non-ionic, hypoallergenic, myelogram-compatible radiological contrast medium.   lidocaine (XYLOCAINE) 2 % (with pres) injection 400 mg   sodium chloride flush (NS) 0.9 % injection 2 mL   ropivacaine (PF) 2 mg/mL (0.2%) (NAROPIN) injection 2 mL   dexamethasone (DECADRON) injection 10 mg   Medications administered: We administered iohexol, lidocaine, sodium chloride flush, ropivacaine (PF) 2 mg/mL (0.2%), and dexamethasone.  See the medical record for exact dosing, route, and time of administration.  Follow-up plan:   Return in about 4 weeks (around 10/11/2021) for Post Procedure Evaluation, virtual.      Recent Visits Date Type Provider Dept  08/29/21 Office Visit Edward Jolly, MD [facility]  Showing recent visits within past 90 days and meeting all other requirements Today's Visits Date Type Provider Dept  09/13/21 Procedure visit Edward Jolly, MD [facility]  Showing today's visits and meeting all other requirements Future Appointments Date Type Provider Dept  10/16/21 Appointment Edward Jolly, MD [facility]   11/21/21 Appointment Edward Jolly, MD [facility]  Showing future appointments within next 90 days and meeting all other requirements  Disposition: Discharge home  Discharge (Date  Time): 09/13/2021; 1120 hrs.   Primary Care Physician: Enid Baas, MD Location: [facility] Note by: Edward Jolly, MD Date: 09/13/2021; Time: 11:14 AM  Disclaimer:  Medicine is not an exact science. The only guarantee in medicine is that nothing is guaranteed. It is important to note that the decision to proceed with this intervention was based on the information collected from the patient. The Data and conclusions were drawn from the patient's questionnaire, the interview, and the physical examination. Because the information was provided in large part by the patient, it cannot be guaranteed that it has not been purposely or unconsciously manipulated. Every effort has been made to obtain as much relevant data as possible for this evaluation. It is important to note that the conclusions that lead to this procedure are derived in large part from the available data. Always take into account that the treatment will also be dependent on availability of resources and existing treatment guidelines, considered by other Pain Management Practitioners as being common knowledge and practice, at the time of the intervention. For Medico-Legal purposes, it is also important to point out that variation in procedural techniques and pharmacological choices are the acceptable norm. The indications, contraindications, technique, and results of the above procedure should only be interpreted and judged by a Board-Certified Interventional Pain Specialist with extensive familiarity and expertise in the same exact procedure and technique.

## 2021-09-13 NOTE — Patient Instructions (Signed)
Pain Management Discharge Instructions  General Discharge Instructions :  If you need to reach your doctor call: Monday-Friday 8:00 am - 4:00 pm at [phone] or toll free [phone].  After clinic hours [phone] to have operator reach doctor.  Bring all of your medication bottles to all your appointments in the pain clinic.  To cancel or reschedule your appointment with Pain Management please remember to call 24 hours in advance to avoid a fee.  Refer to the educational materials which you have been given on: General Risks, I had my Procedure. Discharge Instructions, Post Sedation.  Post Procedure Instructions:  The drugs you were given will stay in your system until tomorrow, so for the next 24 hours you should not drive, make any legal decisions or drink any alcoholic beverages.  You may eat anything you prefer, but it is better to start with liquids then soups and crackers, and gradually work up to solid foods.  Please notify your doctor immediately if you have any unusual bleeding, trouble breathing or pain that is not related to your normal pain.  Depending on the type of procedure that was done, some parts of your body may feel week and/or numb.  This usually clears up by tonight or the next day.  Walk with the use of an assistive device or accompanied by an adult for the 24 hours.  You may use ice on the affected area for the first 24 hours.  Put ice in a Ziploc bag and cover with a towel and place against area 15 minutes on 15 minutes off.  You may switch to heat after 24 hours.Epidural Steroid Injection Patient Information  Description: The epidural space surrounds the nerves as they exit the spinal cord.  In some patients, the nerves can be compressed and inflamed by a bulging disc or a tight spinal canal (spinal stenosis).  By injecting steroids into the epidural space, we can bring irritated nerves into direct contact with a potentially helpful medication.  These  steroids act directly on the irritated nerves and can reduce swelling and inflammation which often leads to decreased pain.  Epidural steroids may be injected anywhere along the spine and from the neck to the low back depending upon the location of your pain.   After numbing the skin with local anesthetic (like Novocaine), a small needle is passed into the epidural space slowly.  You may experience a sensation of pressure while this is being done.  The entire block usually last less than 10 minutes.  Conditions which may be treated by epidural steroids:  Low back and leg pain Neck and arm pain Spinal stenosis Post-laminectomy syndrome Herpes zoster (shingles) pain Pain from compression fractures  Preparation for the injection:  Do not eat any solid food or dairy products within 8 hours of your appointment.  You may drink clear liquids up to 3 hours before appointment.  Clear liquids include water, black coffee, juice or soda.  No milk or cream please. You may take your regular medication, including pain medications, with a sip of water before your appointment  Diabetics should hold regular insulin (if taken separately) and take 1/2 normal NPH dos the morning of the procedure.  Carry some sugar containing items with you to your appointment. A driver must accompany you and be prepared to drive you home after your procedure.  Bring all your current medications with your. An IV may be inserted and sedation may be given at the discretion of the physician.     A blood pressure cuff, EKG and other monitors will often be applied during the procedure.  Some patients may need to have extra oxygen administered for a short period. You will be asked to provide medical information, including your allergies, prior to the procedure.  We must know immediately if you are taking blood thinners (like Coumadin/Warfarin)  Or if you are allergic to IV iodine contrast (dye). We must know if you could possible be  pregnant.  Possible side-effects: Bleeding from needle site Infection (rare, may require surgery) Nerve injury (rare) Numbness & tingling (temporary) Difficulty urinating (rare, temporary) Spinal headache ( a headache worse with upright posture) Light -headedness (temporary) Pain at injection site (several days) Decreased blood pressure (temporary) Weakness in arm/leg (temporary) Pressure sensation in back/neck (temporary)  Call if you experience: Fever/chills associated with headache or increased back/neck pain. Headache worsened by an upright position. New onset weakness or numbness of an extremity below the injection site Hives or difficulty breathing (go to the emergency room) Inflammation or drainage at the infection site Severe back/neck pain Any new symptoms which are concerning to you  Please note:  Although the local anesthetic injected can often make your back or neck feel good for several hours after the injection, the pain will likely return.  It takes 3-7 days for steroids to work in the epidural space.  You may not notice any pain relief for at least that one week.  If effective, we will often do a series of three injections spaced 3-6 weeks apart to maximally decrease your pain.  After the initial series, we generally will wait several months before considering a repeat injection of the same type.  If you have any questions, please call [phone] [facility] 

## 2021-09-13 NOTE — Progress Notes (Signed)
Safety precautions to be maintained throughout the outpatient stay will include: orient to surroundings, keep bed in low position, maintain call bell within reach at all times, provide assistance with transfer out of bed and ambulation.  

## 2021-09-14 ENCOUNTER — Telehealth: Payer: Self-pay

## 2021-09-14 NOTE — Telephone Encounter (Signed)
Post procedure phone call.  Patient states she is doing well.  

## 2021-10-16 ENCOUNTER — Ambulatory Visit
Payer: Medicare Other | Attending: Student in an Organized Health Care Education/Training Program | Admitting: Student in an Organized Health Care Education/Training Program

## 2021-10-16 DIAGNOSIS — M5416 Radiculopathy, lumbar region: Secondary | ICD-10-CM

## 2021-10-16 DIAGNOSIS — M48061 Spinal stenosis, lumbar region without neurogenic claudication: Secondary | ICD-10-CM

## 2021-10-16 NOTE — Progress Notes (Signed)
Patient: Leslie Carrillo  Service Category: E/M  Provider: Gillis Santa, MD  DOB: 1960/10/16  DOS: 10/16/2021  Location: Office  MRN: [id number]  Setting: Ambulatory outpatient  Referring Provider: Gladstone Lighter, MD  Type: Established Patient  Specialty: Interventional Pain Management  PCP: Gladstone Lighter, MD  Location: Remote location  Delivery: TeleHealth     Virtual Encounter - Pain Management PROVIDER NOTE: Information contained herein reflects review and annotations entered in association with encounter. Interpretation of such information and data should be left to medically-trained personnel. Information provided to patient can be located elsewhere in the medical record under "Patient Instructions". Document created using STT-dictation technology, any transcriptional errors that may result from process are unintentional.    Contact & Pharmacy Preferred: [phone] Home: [phone] (home) Mobile: [phone] (mobile) E-mail: [email or web address]  [id number] [address], [address] [place] [address] Phone: [phone] Fax: [phone]   Pre-screening  Ms. Sirico offered "in-person" vs "virtual" encounter. She indicated preferring virtual for this encounter.   Reason COVID-19*  Social distancing based on CDC and AMA recommendations.   I contacted Ellwood Handler on 10/16/2021 via telephone.      I clearly identified myself as Gillis Santa, MD. I verified that I was speaking with the correct person using two identifiers (Name: Leslie Carrillo, and date of birth: 1960/05/10).  Consent I sought verbal advanced consent from Ellwood Handler for virtual visit interactions. I informed Ms. Rubis of possible security and privacy concerns, risks, and limitations associated with providing "not-in-person" medical evaluation and management services. I also informed Ms. Tsang of the availability of  "in-person" appointments. Finally, I informed her that there would be a charge for the virtual visit and that she could be  personally, fully or partially, financially responsible for it. Ms. Louvier expressed understanding and agreed to proceed.   Historic Elements   Leslie Carrillo is a 61 y.o. year old, female patient evaluated today after our last contact on 09/13/2021. Leslie Carrillo  has a past medical history of Anemia, Arthritis, Asthma, Chronic kidney disease, Chronic pain, and History of kidney stones. She also  has a past surgical history that includes Cervical fusion (2014); carpel tunnel (Right); Cholecystectomy; c sections (N/A, 1985); Colonoscopy; and Shoulder arthroscopy with subacromial decompression, rotator cuff repair and bicep tendon repair (Right, 11/08/2020). Leslie Carrillo has a current medication list which includes the following prescription(s): acetaminophen, buprenorphine, cholecalciferol, clobetasol, clonidine, and sertraline. She  reports that she has been smoking cigarettes. She has a 10.00 pack-year smoking history. She has never used smokeless tobacco. She reports that she does not drink alcohol and does not use drugs. Leslie Carrillo is allergic to codeine, pregabalin, hydrocodone-acetaminophen, tramadol, latex, lovastatin, and simvastatin.   HPI  Today, she is being contacted for a post-procedure assessment.   Post-procedure evaluation   Type: Lumbar epidural steroid injection (LESI) (interlaminar) #1    Laterality: Right   Level:  L4-5 Level.  Imaging: Fluoroscopic guidance Anesthesia: Local anesthesia (1-2% Lidocaine) Anxiolysis: None                 Sedation: None. DOS: 09/13/2021  Performed by: Gillis Santa, MD  Purpose: Diagnostic/Therapeutic Indications: Lumbar radicular pain of intraspinal etiology of more than 4 weeks that has failed to respond to conservative therapy and is severe enough to impact quality of life or function. 1. Lumbar radicular pain   2.  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 Lumbar radiculopathy   4. Chronic pain syndrome    NAS-11 Pain score:   Pre-procedure: 6 /10   Post-procedure: 3 /10      Effectiveness:  Initial hour after procedure: 50 %  Subsequent 4-6 hours post-procedure: 50 %  Analgesia past initial 6 hours: 25 % (ongoing)  Ongoing improvement:  Analgesic:  25% however pain is returning and she should like to repeat Function: Somewhat improved ROM: Somewhat improved   Pharmacotherapy Assessment   Opioid Analgesic: Buprenorphine transdermal patch, 15 mcg an hour, oxycodone 5 mg daily as needed for breakthrough pain, quantity #30 to last for 90 days MME/day: 10 mg/day   Monitoring: [place] PMP: PDMP reviewed during this encounter.       Pharmacotherapy: No side-effects or adverse reactions reported. Compliance: No problems identified. Effectiveness: Clinically acceptable. Plan: Refer to "POC". UDS:  Summary  Date Value Ref Range Status  03/09/2021 Note  Final    Comment:    ==================================================================== [employer] Select 13 (MW) ==================================================================== Test                             Result       Flag       Units  Drug Present and Declared for Prescription Verification   Buprenorphine                  28           EXPECTED   ng/mg creat   Norbuprenorphine               35           EXPECTED   ng/mg creat    Source of buprenorphine is a scheduled prescription medication.    Norbuprenorphine is an expected metabolite of buprenorphine.  Drug Absent but Declared for Prescription Verification   Oxycodone                      Not Detected UNEXPECTED ng/mg creat ==================================================================== Test                      Result    Flag   Units      Ref Range   Creatinine              153              mg/dL       >=20 ==================================================================== Declared Medications:  The flagging and interpretation on this report are based on the  following declared medications.  Unexpected results may arise from  inaccuracies in the declared medications.   **Note: The testing scope of this panel includes these medications:   Oxycodone (Roxicodone)   **Note: The testing scope of this panel does not include small to  moderate amounts of these reported medications:   Buprenorphine Patch (BuTrans)   **Note: The testing scope of this panel does not include the  following reported medications:   Acetaminophen (Tylenol)  Ondansetron (Zofran)  Sertraline (Zoloft) ==================================================================== For clinical consultation, please call [phone]. ====================================================================    No results found for: "CBDTHCR", "D8THCCBX", "D9THCCBX"   Laboratory Chemistry Profile   Renal Lab Results  Component Value Date   BUN 11 11/08/2020   CREATININE 0.97 11/08/2020   GFRAA >60 10/16/2019   GFRNONAA >60 11/08/2020    Hepatic Lab Results  Component Value Date  AST 20 10/16/2019   ALT 15 10/16/2019   ALBUMIN 4.0 10/16/2019   ALKPHOS 75 10/16/2019   LIPASE 29 10/16/2019    Electrolytes Lab Results  Component Value Date   NA 137 11/08/2020   K 3.9 11/08/2020   CL 102 11/08/2020   CALCIUM 9.0 11/08/2020    Bone No results found for: "VD25OH", "VD125OH2TOT", "AJ6811XB2", "IO0355HR4", "25OHVITD1", "25OHVITD2", "25OHVITD3", "TESTOFREE", "TESTOSTERONE"  Inflammation (CRP: Acute Phase) (ESR: Chronic Phase) No results found for: "CRP", "ESRSEDRATE", "LATICACIDVEN"       Note: Above Lab results reviewed.  Imaging  DG PAIN CLINIC C-ARM 1-60 MIN NO REPORT Fluoro was used, but no Radiologist interpretation will be provided.  Please refer to "NOTES" tab for provider progress note.  Assessment   The primary encounter diagnosis was Lumbar radicular pain. 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  Plan of Care    Mild relief with L-ESI, discussed repeating and adding more volume Continue with medication management with Butrans patch   1. Lumbar radicular pain - Lumbar Epidural Injection; Future  2. 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 Lumbar radiculopathy - Lumbar Epidural Injection; Future      Follow-up plan:   Return in 9 days (on 10/25/2021) for R L4/5 ESI #2.    Recent Visits Date Type Provider Dept  09/13/21 Procedure visit Gillis Santa, MD [facility]  08/29/21 Office Visit Gillis Santa, MD [facility]  Showing recent visits within past 90 days and meeting all other requirements Today's Visits Date Type Provider Dept  10/16/21 Office Visit Gillis Santa, MD [facility]  Showing today's visits and meeting all other requirements Future Appointments Date Type Provider Dept  11/21/21 Appointment Gillis Santa, MD [facility]  Showing future appointments within next 90 days and meeting all other requirements  I discussed the assessment and treatment plan with the patient. The patient was provided an opportunity to ask questions and all were answered. The patient agreed with the plan and demonstrated an understanding of the instructions.  Patient advised to call back or seek an in-person evaluation if the symptoms or condition worsens.  Duration of encounter: 25minutes.  Note by: Gillis Santa, MD Date: 10/16/2021; Time: 2:36 PM

## 2021-10-23 ENCOUNTER — Ambulatory Visit
Payer: Medicare Other | Attending: Student in an Organized Health Care Education/Training Program | Admitting: Student in an Organized Health Care Education/Training Program

## 2021-10-23 ENCOUNTER — Encounter: Payer: Self-pay | Admitting: Student in an Organized Health Care Education/Training Program

## 2021-10-23 ENCOUNTER — Ambulatory Visit
Admission: RE | Admit: 2021-10-23 | Discharge: 2021-10-23 | Disposition: A | Discharge status: Home / Self Care | Payer: Medicare Other | Source: Ambulatory Visit | Attending: Student in an Organized Health Care Education/Training Program | Admitting: Student in an Organized Health Care Education/Training Program

## 2021-10-23 VITALS — BP 121/80 | HR 64 | Temp 97.0°F | Resp 14 | Ht 63.0 in | Wt 170.0 lb

## 2021-10-23 DIAGNOSIS — M5416 Radiculopathy, lumbar region: Secondary | ICD-10-CM

## 2021-10-23 DIAGNOSIS — M48061 Spinal stenosis, lumbar region without neurogenic claudication: Secondary | ICD-10-CM | POA: Diagnosis present

## 2021-10-23 DIAGNOSIS — G894 Chronic pain syndrome: Secondary | ICD-10-CM

## 2021-10-23 MED ORDER — DEXAMETHASONE SODIUM PHOSPHATE 10 MG/ML IJ SOLN
INTRAMUSCULAR | Status: AC
  Filled 2021-10-23: qty 1

## 2021-10-23 MED ORDER — SODIUM CHLORIDE (PF) 0.9 % IJ SOLN
INTRAMUSCULAR | Status: AC
  Filled 2021-10-23: qty 10

## 2021-10-23 MED ORDER — IOHEXOL 180 MG/ML  SOLN
10.0000 mL | Freq: Once | INTRAMUSCULAR | Status: AC
  Administered 2021-10-23: 10 mL via EPIDURAL

## 2021-10-23 MED ORDER — ROPIVACAINE HCL 2 MG/ML IJ SOLN
2.0000 mL | Freq: Once | INTRAMUSCULAR | Status: AC
  Administered 2021-10-23: 2 mL via EPIDURAL

## 2021-10-23 MED ORDER — LIDOCAINE HCL 2 % IJ SOLN
INTRAMUSCULAR | Status: AC
  Filled 2021-10-23: qty 20

## 2021-10-23 MED ORDER — ROPIVACAINE HCL 2 MG/ML IJ SOLN
INTRAMUSCULAR | Status: AC
  Filled 2021-10-23: qty 20

## 2021-10-23 MED ORDER — LIDOCAINE HCL 2 % IJ SOLN
20.0000 mL | Freq: Once | INTRAMUSCULAR | Status: AC
  Administered 2021-10-23: 400 mg

## 2021-10-23 MED ORDER — SODIUM CHLORIDE 0.9% FLUSH
2.0000 mL | Freq: Once | INTRAVENOUS | Status: AC
  Administered 2021-10-23: 2 mL

## 2021-10-23 MED ORDER — DEXAMETHASONE SODIUM PHOSPHATE 10 MG/ML IJ SOLN
10.0000 mg | Freq: Once | INTRAMUSCULAR | Status: AC
  Administered 2021-10-23: 10 mg

## 2021-10-23 MED ORDER — IOHEXOL 180 MG/ML  SOLN
INTRAMUSCULAR | Status: AC
  Filled 2021-10-23: qty 20

## 2021-10-23 NOTE — Progress Notes (Signed)
Safety precautions to be maintained throughout the outpatient stay will include: orient to surroundings, keep bed in low position, maintain call bell within reach at all times, provide assistance with transfer out of bed and ambulation.  

## 2021-10-23 NOTE — Progress Notes (Signed)
PROVIDER NOTE: Interpretation of information contained herein should be left to medically-trained personnel. Specific patient instructions are provided elsewhere under "Patient Instructions" section of medical record. This document was created in part using STT-dictation technology, any transcriptional errors that may result from this process are unintentional.  Patient: Leslie Carrillo Type: Established DOB: May 11, 1960 MRN: [id number] PCP: Enid Baas, MD  Service: Procedure DOS: 10/23/2021 Setting: Ambulatory Location: Ambulatory outpatient facility Delivery: Face-to-face Provider: Edward Jolly, MD Specialty: Interventional Pain Management Specialty designation: 09 Location: Outpatient facility Ref. Prov.: Enid Baas, MD    Primary Reason for Visit: Interventional Pain Management Treatment. CC: Back Pain (low)   Procedure:           Type: Lumbar epidural steroid injection (LESI) (interlaminar) #2    Laterality: Right   Level:  L4-5 Level.  Imaging: Fluoroscopic guidance Anesthesia: Local anesthesia (1-2% Lidocaine) Anxiolysis: None                 Sedation: None. DOS: 10/23/2021  Performed by: Edward Jolly, MD  Purpose: Diagnostic/Therapeutic Indications: Lumbar radicular pain of intraspinal etiology of more than 4 weeks that has failed to respond to conservative therapy and is severe enough to impact quality of life or function. 1. Lumbar radicular pain   2. 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 Lumbar radiculopathy   4. Chronic pain syndrome    NAS-11 Pain score:   Pre-procedure: 4 /10   Post-procedure: 1 /10      Position / Prep / Materials:  Position: Prone w/ head of the table raised (slight reverse trendelenburg) to facilitate breathing.  Prep solution: DuraPrep (Iodine Povacrylex [0.7% available iodine] and Isopropyl Alcohol, 74% w/w) Prep Area: Entire Posterior Lumbar Region from lower scapular tip down to mid buttocks area and from  flank to flank. Materials:  Tray: Epidural tray Needle(s):  Type: Epidural needle (Tuohy) Gauge (G):  22 Length: Regular (3.5-in) Qty: 1  Pre-op H&P Assessment:  Ms. Worthington is a 61 y.o. (year old), female patient, seen today for interventional treatment. She  has a past surgical history that includes Cervical fusion (2014); carpel tunnel (Right); Cholecystectomy; c sections (N/A, 1985); Colonoscopy; and Shoulder arthroscopy with subacromial decompression, rotator cuff repair and bicep tendon repair (Right, 11/08/2020). Ms. Ancheta has a current medication list which includes the following prescription(s): acetaminophen, buprenorphine, cholecalciferol, clobetasol, clonidine, and sertraline. Her primarily concern today is the Back Pain (low)  Initial Vital Signs:  Pulse/HCG Rate: 63 ECG Heart Rate: (!) 56 Temp: (!) 97 F (36.1 C) Resp: 16 BP: 116/77 SpO2: 97 %  BMI: Estimated body mass index is 30.11 kg/m as calculated from the following:   Height as of this encounter: 5\' 3"  (1.6 m).   Weight as of this encounter: 170 lb (77.1 kg).  Risk Assessment: Allergies: Reviewed. She is allergic to codeine, pregabalin, hydrocodone-acetaminophen, tramadol, latex, lovastatin, and simvastatin.  Allergy Precautions: None required Coagulopathies: Reviewed. None identified.  Blood-thinner therapy: None at this time Active Infection(s): Reviewed. None identified. Ms. Rochette is afebrile  Site Confirmation: Ms. Schurman was asked to confirm the procedure and laterality before marking the site Procedure checklist: Completed Consent: Before the procedure and under the influence of no sedative(s), amnesic(s), or anxiolytics, the patient was informed of the treatment options, risks and possible complications. To fulfill our ethical and legal obligations, as recommended by the American Medical Association's Code of Ethics, I have informed the patient of my clinical impression; the nature and purpose of the  treatment  or procedure; the risks, benefits, and possible complications of the intervention; the alternatives, including doing nothing; the risk(s) and benefit(s) of the alternative treatment(s) or procedure(s); and the risk(s) and benefit(s) of doing nothing. The patient was provided information about the general risks and possible complications associated with the procedure. These may include, but are not limited to: failure to achieve desired goals, infection, bleeding, organ or nerve damage, allergic reactions, paralysis, and death. In addition, the patient was informed of those risks and complications associated to Spine-related procedures, such as failure to decrease pain; infection (i.e.: Meningitis, epidural or intraspinal abscess); bleeding (i.e.: epidural hematoma, subarachnoid hemorrhage, or any other type of intraspinal or peri-dural bleeding); organ or nerve damage (i.e.: Any type of peripheral nerve, nerve root, or spinal cord injury) with subsequent damage to sensory, motor, and/or autonomic systems, resulting in permanent pain, numbness, and/or weakness of one or several areas of the body; allergic reactions; (i.e.: anaphylactic reaction); and/or death. Furthermore, the patient was informed of those risks and complications associated with the medications. These include, but are not limited to: allergic reactions (i.e.: anaphylactic or anaphylactoid reaction(s)); adrenal axis suppression; blood sugar elevation that in diabetics may result in ketoacidosis or comma; water retention that in patients with history of congestive heart failure may result in shortness of breath, pulmonary edema, and decompensation with resultant heart failure; weight gain; swelling or edema; medication-induced neural toxicity; particulate matter embolism and blood vessel occlusion with resultant organ, and/or nervous system infarction; and/or aseptic necrosis of one or more joints. Finally, the patient was informed that  Medicine is not an exact science; therefore, there is also the possibility of unforeseen or unpredictable risks and/or possible complications that may result in a catastrophic outcome. The patient indicated having understood very clearly. We have given the patient no guarantees and we have made no promises. Enough time was given to the patient to ask questions, all of which were answered to the patient's satisfaction. Ms. Sealey has indicated that she wanted to continue with the procedure. Attestation: I, the ordering provider, attest that I have discussed with the patient the benefits, risks, side-effects, alternatives, likelihood of achieving goals, and potential problems during recovery for the procedure that I have provided informed consent. Date  Time: 10/23/2021 10:27 AM  Pre-Procedure Preparation:  Monitoring: As per clinic protocol. Respiration, ETCO2, SpO2, BP, heart rate and rhythm monitor placed and checked for adequate function Safety Precautions: Patient was assessed for positional comfort and pressure points before starting the procedure. Time-out: I initiated and conducted the "Time-out" before starting the procedure, as per protocol. The patient was asked to participate by confirming the accuracy of the "Time Out" information. Verification of the correct person, site, and procedure were performed and confirmed by me, the nursing staff, and the patient. "Time-out" conducted as per Joint Commission's Universal Protocol (UP.01.01.01). Time: 1107  Description/Narrative of Procedure:          Target: Epidural space via interlaminar opening, initially targeting the lower laminar border of the superior vertebral body. Region: Lumbar Approach: Percutaneous paravertebral  Rationale (medical necessity): procedure needed and proper for the diagnosis and/or treatment of the patient's medical symptoms and needs. Procedural Technique Safety Precautions: Aspiration looking for blood return was  conducted prior to all injections. At no point did we inject any substances, as a needle was being advanced. No attempts were made at seeking any paresthesias. Safe injection practices and needle disposal techniques used. Medications properly checked for expiration dates. SDV (single dose vial) medications  used. Description of the Procedure: Protocol guidelines were followed. The procedure needle was introduced through the skin, ipsilateral to the reported pain, and advanced to the target area. Bone was contacted and the needle walked caudad, until the lamina was cleared. The epidural space was identified using "loss-of-resistance technique" with 2-3 ml of PF-NaCl (0.9% NSS), in a 5cc LOR glass syringe.   6 cc solution made of 3 cc of preservative-free saline, 2 cc of 0.2% ropivacaine, 1 cc of Decadron 10 mg/cc.   Vitals:   10/23/21 1037 10/23/21 1038 10/23/21 1108 10/23/21 1111  BP:  116/77 112/77 121/80  Pulse: 63 64    Resp: 16  15 14   Temp: (!) 97 F (36.1 C)     SpO2: 97% 98% 95% 94%  Weight: 170 lb (77.1 kg)     Height: 5\' 3"  (1.6 m)       Start Time: 1107 hrs. End Time: 1111 hrs.  Imaging Guidance (Spinal):          Type of Imaging Technique: Fluoroscopy Guidance (Spinal) Indication(s): Assistance in needle guidance and placement for procedures requiring needle placement in or near specific anatomical locations not easily accessible without such assistance. Exposure Time: Please see nurses notes. Contrast: Before injecting any contrast, we confirmed that the patient did not have an allergy to iodine, shellfish, or radiological contrast. Once satisfactory needle placement was completed at the desired level, radiological contrast was injected. Contrast injected under live fluoroscopy. No contrast complications. See chart for type and volume of contrast used. Fluoroscopic Guidance: I was personally present during the use of fluoroscopy. "Tunnel Vision Technique" used to obtain the best  possible view of the target area. Parallax error corrected before commencing the procedure. "Direction-depth-direction" technique used to introduce the needle under continuous pulsed fluoroscopy. Once target was reached, antero-posterior, oblique, and lateral fluoroscopic projection used confirm needle placement in all planes. Images permanently stored in EMR. Interpretation: I personally interpreted the imaging intraoperatively. Adequate needle placement confirmed in multiple planes. Appropriate spread of contrast into desired area was observed. No evidence of afferent or efferent intravascular uptake. No intrathecal or subarachnoid spread observed. Permanent images saved into the patient's record.  Antibiotic Prophylaxis:   Anti-infectives (From admission, onward)    None      Indication(s): None identified  Post-operative Assessment:  Post-procedure Vital Signs:  Pulse/HCG Rate: 64 (!) 51 Temp: (!) 97 F (36.1 C) Resp: 14 BP:  121/80 SpO2: 94 %  EBL: None  Complications: No immediate post-treatment complications observed by team, or reported by patient.  Note: The patient tolerated the entire procedure well. A repeat set of vitals were taken after the procedure and the patient was kept under observation following institutional policy, for this type of procedure. Post-procedural neurological assessment was performed, showing return to baseline, prior to discharge. The patient was provided with post-procedure discharge instructions, including a section on how to identify potential problems. Should any problems arise concerning this procedure, the patient was given instructions to immediately contact , at any time, without hesitation. In any case, we plan to contact the patient by telephone for a follow-up status report regarding this interventional procedure.  Comments:  No additional relevant information.  Plan of Care  Orders:  Orders Placed This Encounter  Procedures   DG  PAIN CLINIC C-ARM 1-60 MIN NO REPORT    Intraoperative interpretation by procedural physician at [facility].    Standing Status:   Standing    Number of Occurrences:  1    Order Specific Question:   Reason for exam:    Answer:   Assistance in needle guidance and placement for procedures requiring needle placement in or near specific anatomical locations not easily accessible without such assistance.   Chronic Opioid Analgesic:  Buprenorphine transdermal patch, 15 mcg an hour, oxycodone 5 mg daily as needed for breakthrough pain, quantity #30 to last for 90 days MME/day: 10 mg/day   Medications ordered for procedure: Meds ordered this encounter  Medications   iohexol (OMNIPAQUE) 180 MG/ML injection 10 mL    Must be Myelogram-compatible. If not available, you may substitute with a water-soluble, non-ionic, hypoallergenic, myelogram-compatible radiological contrast medium.   lidocaine (XYLOCAINE) 2 % (with pres) injection 400 mg   sodium chloride flush (NS) 0.9 % injection 2 mL   ropivacaine (PF) 2 mg/mL (0.2%) (NAROPIN) injection 2 mL   dexamethasone (DECADRON) injection 10 mg   Medications administered: We administered iohexol, lidocaine, sodium chloride flush, ropivacaine (PF) 2 mg/mL (0.2%), and dexamethasone.  See the medical record for exact dosing, route, and time of administration.  Follow-up plan:   Return for Keep sch. appt.      Recent Visits Date Type Provider Dept  10/16/21 Office Visit Edward Jolly, MD [facility]  09/13/21 Procedure visit Edward Jolly, MD [facility]  08/29/21 Office Visit Edward Jolly, MD [facility]  Showing recent visits within past 90 days and meeting all other requirements Today's Visits Date Type Provider Dept  10/23/21 Procedure visit Edward Jolly, MD [facility]  Showing today's visits and meeting all other requirements Future Appointments Date Type Provider Dept  11/21/21 Appointment  Edward Jolly, MD [facility]  Showing future appointments within next 90 days and meeting all other requirements  Disposition: Discharge home  Discharge (Date  Time): 10/23/2021; 1117 hrs.   Primary Care Physician: Enid Baas, MD Location: [facility] Note by: Edward Jolly, MD Date: 10/23/2021; Time: 11:40 AM  Disclaimer:  Medicine is not an exact science. The only guarantee in medicine is that nothing is guaranteed. It is important to note that the decision to proceed with this intervention was based on the information collected from the patient. The Data and conclusions were drawn from the patient's questionnaire, the interview, and the physical examination. Because the information was provided in large part by the patient, it cannot be guaranteed that it has not been purposely or unconsciously manipulated. Every effort has been made to obtain as much relevant data as possible for this evaluation. It is important to note that the conclusions that lead to this procedure are derived in large part from the available data. Always take into account that the treatment will also be dependent on availability of resources and existing treatment guidelines, considered by other Pain Management Practitioners as being common knowledge and practice, at the time of the intervention. For Medico-Legal purposes, it is also important to point out that variation in procedural techniques and pharmacological choices are the acceptable norm. The indications, contraindications, technique, and results of the above procedure should only be interpreted and judged by a Board-Certified Interventional Pain Specialist with extensive familiarity and expertise in the same exact procedure and technique.

## 2021-10-24 ENCOUNTER — Telehealth: Payer: Self-pay | Admitting: *Deleted

## 2021-10-24 NOTE — Telephone Encounter (Signed)
No problems post procedure. 

## 2021-11-21 ENCOUNTER — Encounter: Payer: Self-pay | Admitting: Student in an Organized Health Care Education/Training Program

## 2021-11-21 ENCOUNTER — Ambulatory Visit
Payer: Medicare Other | Attending: Student in an Organized Health Care Education/Training Program | Admitting: Student in an Organized Health Care Education/Training Program

## 2021-11-21 VITALS — BP 115/76 | HR 64 | Temp 97.2°F | Resp 16 | Ht 63.0 in | Wt 166.0 lb

## 2021-11-21 DIAGNOSIS — M47816 Spondylosis without myelopathy or radiculopathy, lumbar region: Secondary | ICD-10-CM

## 2021-11-21 DIAGNOSIS — G894 Chronic pain syndrome: Secondary | ICD-10-CM | POA: Diagnosis present

## 2021-11-21 DIAGNOSIS — M48061 Spinal stenosis, lumbar region without neurogenic claudication: Secondary | ICD-10-CM | POA: Diagnosis present

## 2021-11-21 DIAGNOSIS — M5416 Radiculopathy, lumbar region: Secondary | ICD-10-CM | POA: Diagnosis present

## 2021-11-21 MED ORDER — OXYCODONE HCL 5 MG PO TABS: 5.0000 mg | ORAL_TABLET | Freq: Every day | ORAL | 0 refills | Status: AC | PRN

## 2021-11-21 MED ORDER — BUPRENORPHINE 15 MCG/HR TD PTWK: 1.0000 | MEDICATED_PATCH | TRANSDERMAL | 2 refills | Status: DC

## 2021-11-21 NOTE — Progress Notes (Signed)
Nursing Pain Medication Assessment:  Safety precautions to be maintained throughout the outpatient stay will include: orient to surroundings, keep bed in low position, maintain call bell within reach at all times, provide assistance with transfer out of bed and ambulation.  Medication Inspection Compliance: Pill count conducted under aseptic conditions, in front of the patient. Neither the pills nor the bottle was removed from the patient's sight at any time. Once count was completed pills were immediately returned to the patient in their original bottle.  Medication #1: Oxycodone IR Pill/Patch Count:  13 of 30 pills remain Pill/Patch Appearance: Markings consistent with prescribed medication Bottle Appearance: Standard pharmacy container. Clearly labeled. Filled Date: 44 / 48 / 2023 Last Medication intake:  yesterday  Medication #2: Buprenorphine (Suboxone) Pill/Patch Count:  0 of 4 pills remain Pill/Patch Appearance: Markings consistent with prescribed medication Bottle Appearance: Standard pharmacy container. Clearly labeled. Filled Date: 08 / 07 / 2023 Last Medication intake:  11/15/21

## 2021-11-21 NOTE — Progress Notes (Signed)
PROVIDER NOTE: Information contained herein reflects review and annotations entered in association with encounter. Interpretation of such information and data should be left to medically-trained personnel. Information provided to patient can be located elsewhere in the medical record under "Patient Instructions". Document created using STT-dictation technology, any transcriptional errors that may result from process are unintentional.    Patient: Leslie Carrillo  Service Category: E/M  Provider: Gillis Santa, MD  DOB: Jun 03, 1960  DOS: 11/21/2021  Specialty: Interventional Pain Management  MRN: [id number]  Setting: Ambulatory outpatient  PCP: Gladstone Lighter, MD  Type: Established Patient    Referring Provider: Gladstone Lighter, MD  Location: Office  Delivery: Face-to-face     HPI  Ms. KEZIAH AVIS, a 61 y.o. year old female, is here today because of her Lumbar facet arthropathy [M47.816]. Ms. Kemmer primary complain today is Back Pain   Last encounter: My last encounter with her was on 10/23/2021  Pertinent problems: Ms. Messineo has Chronic low back pain; DDD (degenerative disc disease), thoracic; Lumbar radiculopathy; Cervical facet joint syndrome; Bilateral primary osteoarthritis of knee; Lumbar degenerative disc disease; Chronic pain syndrome; Myofascial pain syndrome; and Lumbar facet arthropathy on their pertinent problem list. Pain Assessment: Severity of Chronic pain is reported as a 4 /10. Location: Back Lower/into right leg, down to ankle. Onset: More than a month ago. Quality: Aching. Timing: Constant. Modifying factor(s): LESI, meds. Vitals:  height is _0  (1.6 m) and weight is 166 lb (75.3 kg). Her temporal temperature is 97.2 F (36.2 C) (abnormal). Her blood pressure is 115/76 and her pulse is 64. Her respiration is 16 and oxygen saturation is 100%.   Reason for encounter: both, medication management and post-procedure assessment.    Good relief with right L4-L5 lumbar epidural  steroid injection, see postprocedural evaluation below.  She states that she is utilizing less as needed oxycodone. Is having increased lumbar spine pain most pronounced in L3-L4 and L4-L5 region.  Prior lumbar spine x-rays show severe facet arthropathy at L3-L4 and L4-L5.  We have discussed diagnostic lumbar facet medial branch nerve blocks to help target her low back pain. Refill Butrans patch and oxycodone as below.  No change in dose.     Post-procedure evaluation   Type: Lumbar epidural steroid injection (LESI) (interlaminar) #2    Laterality: Right   Level:  L4-5 Level.  Imaging: Fluoroscopic guidance Anesthesia: Local anesthesia (1-2% Lidocaine) Anxiolysis: None                 Sedation: None. DOS: 10/23/2021  Performed by: Gillis Santa, MD  Purpose: Diagnostic/Therapeutic Indications: Lumbar radicular pain of intraspinal etiology of more than 4 weeks that has failed to respond to conservative therapy and is severe enough to impact quality of life or function. 1. Lumbar radicular pain   2. 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 Lumbar radiculopathy   4. Chronic pain syndrome    NAS-11 Pain score:   Pre-procedure: 4 /10   Post-procedure: 1 /10      Effectiveness:  Initial hour after procedure: 100 %  Subsequent 4-6 hours post-procedure: 100 % Analgesia past initial 6 hours: 100 % (X 3 weeks)  Ongoing improvement:  Analgesic:  80% especially in regards to her leg pain Function: Somewhat improved walking distance ROM: Somewhat improved walking distance   Pharmacotherapy Assessment  Analgesic: Buprenorphine transdermal patch, 15 mcg an hour, oxycodone 5 mg daily as needed for breakthrough pain, quantity #30 to last for 90 days MME/day: 10  mg/day   Monitoring: [place] PMP: PDMP reviewed during this encounter.       Pharmacotherapy: No side-effects or adverse reactions reported. Compliance: No problems identified. Effectiveness: Clinically acceptable.   UDS:  Summary  Date Value Ref Range Status  03/09/2021 Note  Final    Comment:    ==================================================================== [employer] [facility] 13 (MW) ==================================================================== Test                             Result       Flag       Units  Drug Present and Declared for Prescription Verification   Buprenorphine                  28           EXPECTED   ng/mg creat   Norbuprenorphine               35           EXPECTED   ng/mg creat    Source of buprenorphine is a scheduled prescription medication.    Norbuprenorphine is an expected metabolite of buprenorphine.  Drug Absent but Declared for Prescription Verification   Oxycodone                      Not Detected UNEXPECTED ng/mg creat ==================================================================== Test                      Result    Flag   Units      Ref Range   Creatinine              153              mg/dL      >=20 ==================================================================== Declared Medications:  The flagging and interpretation on this report are based on the  following declared medications.  Unexpected results may arise from  inaccuracies in the declared medications.   **Note: The testing scope of this panel includes these medications:   Oxycodone (Roxicodone)   **Note: The testing scope of this panel does not include small to  moderate amounts of these reported medications:   Buprenorphine Patch (BuTrans)   **Note: The testing scope of this panel does not include the  following reported medications:   Acetaminophen (Tylenol)  Ondansetron (Zofran)  Sertraline (Zoloft) ==================================================================== For clinical consultation, please call [phone]. ====================================================================       ROS  Constitutional: Denies any fever or chills Gastrointestinal: No  reported hemesis, hematochezia, vomiting, or acute GI distress Musculoskeletal: Low back pain Neurological: No reported episodes of acute onset apraxia, aphasia, dysarthria, agnosia, amnesia, paralysis, loss of coordination, or loss of consciousness  Medication Review  Cholecalciferol, acetaminophen, buprenorphine, cloNIDine, clobetasol, oxyCODONE, and sertraline  History Review  Allergy: Ms. Mccully is allergic to codeine, pregabalin, hydrocodone-acetaminophen, tramadol, latex, lovastatin, and simvastatin. Drug: Ms. Dia  reports no history of drug use. Alcohol:  reports no history of alcohol use. Tobacco:  reports that she has been smoking cigarettes. She has a 10.00 pack-year smoking history. She has never used smokeless tobacco. Social: Ms. Butikofer  reports that she has been smoking cigarettes. She has a 10.00 pack-year smoking history. She has never used smokeless tobacco. She reports that she does not drink alcohol and does not use drugs. Medical:  has a past medical history of Anemia, Arthritis, Asthma, Chronic kidney disease, Chronic pain, and History of kidney stones.  Surgical: Ms. Vultaggio  has a past surgical history that includes Cervical fusion (2014); carpel tunnel (Right); Cholecystectomy; c sections (N/A, 1985); Colonoscopy; and Shoulder arthroscopy with subacromial decompression, rotator cuff repair and bicep tendon repair (Right, 11/08/2020). Family: family history includes Dementia in her mother; Prostate cancer in her father; Renal Cyst in her father.  Laboratory Chemistry Profile   Renal Lab Results  Component Value Date   BUN 11 11/08/2020   CREATININE 0.97 11/08/2020   GFRAA >60 10/16/2019   GFRNONAA >60 11/08/2020     Hepatic Lab Results  Component Value Date   AST 20 10/16/2019   ALT 15 10/16/2019   ALBUMIN 4.0 10/16/2019   ALKPHOS 75 10/16/2019   LIPASE 29 10/16/2019     Electrolytes Lab Results  Component Value Date   NA 137 11/08/2020   K 3.9 11/08/2020    CL 102 11/08/2020   CALCIUM 9.0 11/08/2020     Bone No results found for: "VD25OH", "VD125OH2TOT", "PZ0258NI7", "PO2423NT6", "25OHVITD1", "25OHVITD2", "25OHVITD3", "TESTOFREE", "TESTOSTERONE"   Inflammation (CRP: Acute Phase) (ESR: Chronic Phase) No results found for: "CRP", "ESRSEDRATE", "LATICACIDVEN"     Note: Above Lab results reviewed.  Recent Imaging Review  DG PAIN CLINIC C-ARM 1-60 MIN NO REPORT Fluoro was used, but no Radiologist interpretation will be provided.  Please refer to "NOTES" tab for provider progress note.  CLINICAL DATA:  Low back pain extending into the hips bilaterally.   EXAM: RIGHT KNEE - 1-2 VIEW; LEFT KNEE - 1-2 VIEW; DG HIP (WITH OR WITHOUT PELVIS) 2-3V LEFT; LUMBAR SPINE - COMPLETE WITH BENDING VIEWS; DG HIP (WITH OR WITHOUT PELVIS) 2-3V RIGHT   COMPARISON:  MRI lumbar spine 01/09/2012   FINDINGS: Non rib-bearing lumbar type vertebral bodies are present. Progressive levoconvex curvature of the lumbar spine is centered at L2-3. Compensatory rightward curvature is present at L5. Slight retrolisthesis is present at L2-3 and L3-4. There is slight anterolisthesis at L4-5. Marked facet hypertrophy is present from L3-4 through L5-S1.   Atherosclerotic calcifications are present in the aorta.   There is no significant change in listhesis associated with flexion or extension.   IMPRESSION: 1. 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 2. Retrolisthesis at L3-4 and anterolisthesis at L4-5 is new. 3. Significant facet hypertrophy at L3-4 and L4-5 in particular may contribute to radicular symptoms.     Electronically Signed   By: San Morelle M.D.   On: 04/01/2019 06:02  Note: Reviewed        Physical Exam  General appearance: Well nourished, well developed, and well hydrated. In no apparent acute distress Mental status: Alert, oriented x 3 (person, place, & time)       Respiratory: No evidence of acute respiratory  distress Eyes: PERLA Vitals: BP 115/76   Pulse 64   Temp (!) 97.2 F (36.2 C) (Temporal)   Resp 16   Ht _0  (1.6 m)   Wt 166 lb (75.3 kg)   SpO2 100%   BMI 29.41 kg/m  BMI: Estimated body mass index is 29.41 kg/m as calculated from the following:   Height as of this encounter: _1  (1.6 m).   Weight as of this encounter: 166 lb (75.3 kg). Ideal: Ideal body weight: 52.4 kg (115 lb 8.3 oz) Adjusted ideal body weight: 61.6 kg (135 lb 11.4 oz)  Lumbar Spine Area Exam  Skin & Axial Inspection: No masses, redness, or swelling Alignment: Symmetrical Functional ROM: Pain restricted ROM affecting both sides right greater than left Stability:  No instability detected Muscle Tone/Strength: Functionally intact. No obvious neuro-muscular anomalies detected. Sensory (Neurological): Arthropathic  Pain with facet loading and lumbar extension consistent with facet arthropathy  Gait & Posture Assessment  Ambulation: Unassisted Gait: Relatively normal for age and body habitus Posture: WNL  Lower Extremity Exam    Side: Right lower extremity  Side: Left lower extremity  Stability: No instability observed          Stability: No instability observed          Skin & Extremity Inspection: Skin color, temperature, and hair growth are WNL. No peripheral edema or cyanosis. No masses, redness, swelling, asymmetry, or associated skin lesions. No contractures.  Skin & Extremity Inspection: Skin color, temperature, and hair growth are WNL. No peripheral edema or cyanosis. No masses, redness, swelling, asymmetry, or associated skin lesions. No contractures.  Functional ROM: Improved after treatment for hip and knee joints          Functional ROM: Unrestricted ROM                  Muscle Tone/Strength: Functionally intact. No obvious neuro-muscular anomalies detected.  Muscle Tone/Strength: Functionally intact. No obvious neuro-muscular anomalies detected.  Sensory (Neurological): Improved        Sensory  (Neurological): Unimpaired        DTR: Patellar: deferred today Achilles: deferred today Plantar: deferred today  DTR: Patellar: deferred today Achilles: deferred today Plantar: deferred today  Palpation: No palpable anomalies  Palpation: No palpable anomalies     Assessment   Status Diagnosis  Having a Flare-up Having a Flare-up Responding 1. Lumbar facet arthropathy   2. Lumbar spondylosis   3. Lumbar radicular pain   4. 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 Chronic pain syndrome   6. Lumbar radiculopathy         Plan of Care   Ms. PADDY WALTHALL has a current medication list which includes the following long-term medication(s): clonidine and sertraline.  LAVORA BRISBON has a history of greater than 3 months of moderate to severe pain which is resulted in functional impairment.  The patient has tried various conservative therapeutic options such as NSAIDs, Tylenol, muscle relaxants, physical therapy which was inadequately effective.  Patient's pain is predominantly axial with physical exam findings and radiographic findings suggestive of facet arthropathy. Lumbar facet medial branch nerve blocks were discussed with the patient.  Risks and benefits were reviewed.  Patient would like to proceed with bilateral L3, L4, L5 medial branch nerve block.    1. Lumbar facet arthropathy - LUMBAR FACET(MEDIAL BRANCH NERVE BLOCK) MBNB; Future  2. Lumbar spondylosis - LUMBAR FACET(MEDIAL BRANCH NERVE BLOCK) MBNB; Future  3. Lumbar radicular pain - buprenorphine (BUTRANS) 15 MCG/HR; Place 1 patch onto the skin once a week. For chronic pain syndrome  Dispense: 4 patch; Refill: 2  4. 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 For chronic pain syndrome  Dispense: 4 patch; Refill: 2  5. Chronic pain syndrome - buprenorphine (BUTRANS) 15 MCG/HR; Place 1 patch onto the skin once a  week. For chronic pain syndrome  Dispense: 4 patch; Refill: 2  6. Lumbar radiculopathy - buprenorphine (BUTRANS) 15 MCG/HR; Place 1 patch onto the skin once a week. For chronic pain syndrome  Dispense: 4 patch; Refill: 2    Pharmacotherapy (Medications Ordered): Meds ordered this encounter  Medications   buprenorphine (BUTRANS) 15 MCG/HR  Sig: Place 1 patch onto the skin once a week. For chronic pain syndrome    Dispense:  4 patch    Refill:  2   oxyCODONE (OXY IR/ROXICODONE) 5 MG immediate release tablet    Sig: Take 1 tablet (5 mg total) by mouth daily as needed for severe pain.    Dispense:  30 tablet    Refill:  0   Orders Placed This Encounter  Procedures   LUMBAR FACET(MEDIAL BRANCH NERVE BLOCK) MBNB    Standing Status:   Future    Standing Expiration Date:   02/20/2022    Scheduling Instructions:     Procedure: Lumbar facet block (AKA.: Lumbosacral medial branch nerve block)     Side: Bilateral     Level: L3-4 & L5-S1 Facets (L3, L4, L5, Medial Branch Nerves)     Sedation: LOCAL ONLY     Timeframe: ASAA    Order Specific Question:   Where will this procedure be performed?    Answer:   [facility]     Follow-up plan:   Return in about 2 weeks (around 12/05/2021) for B/L L3, 4, 5 MBNB , in clinic [facility].    Recent Visits Date Type Provider Dept  10/23/21 Procedure visit Gillis Santa, MD [facility]  10/16/21 Office Visit Gillis Santa, MD [facility]  09/13/21 Procedure visit Gillis Santa, MD [facility]  08/29/21 Office Visit Gillis Santa, MD [facility]  Showing recent visits within past 90 days and meeting all other requirements Today's Visits Date Type Provider Dept  11/21/21 Office Visit Gillis Santa, MD [facility]  Showing today's visits and meeting all other requirements Future Appointments Date Type Provider Dept  12/06/21 Appointment Gillis Santa, MD [facility]  Showing future  appointments within next 90 days and meeting all other requirements  I discussed the assessment and treatment plan with the patient. The patient was provided an opportunity to ask questions and all were answered. The patient agreed with the plan and demonstrated an understanding of the instructions.  Patient advised to call back or seek an in-person evaluation if the symptoms or condition worsens.  Duration of encounter:20mnutes.  Note by: BGillis Santa MD Date: 11/21/2021; Time: 10:50 AM

## 2021-11-21 NOTE — Patient Instructions (Signed)
GENERAL RISKS AND COMPLICATIONS  What are the risk, side effects and possible complications? Generally speaking, most procedures are safe.  However, with any procedure there are risks, side effects, and the possibility of complications.  The risks and complications are dependent upon the sites that are lesioned, or the type of nerve block to be performed.  The closer the procedure is to the spine, the more serious the risks are.  Great care is taken when placing the radio frequency needles, block needles or lesioning probes, but sometimes complications can occur. Infection: Any time there is an injection through the skin, there is a risk of infection.  This is why sterile conditions are used for these blocks.  There are four possible types of infection. Localized skin infection. Central Nervous System Infection-This can be in the form of Meningitis, which can be deadly. Epidural Infections-This can be in the form of an epidural abscess, which can cause pressure inside of the spine, causing compression of the spinal cord with subsequent paralysis. This would require an emergency surgery to decompress, and there are no guarantees that the patient would recover from the paralysis. Discitis-This is an infection of the intervertebral discs.  It occurs in about 1% of discography procedures.  It is difficult to treat and it may lead to surgery.        2. Pain: the needles have to go through skin and soft tissues, will cause soreness.       3. Damage to internal structures:  The nerves to be lesioned may be near blood vessels or    other nerves which can be potentially damaged.       4. Bleeding: Bleeding is more common if the patient is taking blood thinners such as  aspirin, Coumadin, Ticiid, Plavix, etc., or if he/she have some genetic predisposition  such as hemophilia. Bleeding into the spinal canal can cause compression of the spinal  cord with subsequent paralysis.  This would require an emergency  surgery to  decompress and there are no guarantees that the patient would recover from the  paralysis.       5. Pneumothorax:  Puncturing of a lung is a possibility, every time a needle is introduced in  the area of the chest or upper back.  Pneumothorax refers to free air around the  collapsed lung(s), inside of the thoracic cavity (chest cavity).  Another two possible  complications related to a similar event would include: Hemothorax and Chylothorax.   These are variations of the Pneumothorax, where instead of air around the collapsed  lung(s), you may have blood or chyle, respectively.       6. Spinal headaches: They may occur with any procedures in the area of the spine.       7. Persistent CSF (Cerebro-Spinal Fluid) leakage: This is a rare problem, but may occur  with prolonged intrathecal or epidural catheters either due to the formation of a fistulous  track or a dural tear.       8. Nerve damage: By working so close to the spinal cord, there is always a possibility of  nerve damage, which could be as serious as a permanent spinal cord injury with  paralysis.       9. Death:  Although rare, severe deadly allergic reactions known as "Anaphylactic  reaction" can occur to any of the medications used.      10. Worsening of the symptoms:  We can always make thing worse.  What are the chances   of something like this happening? Chances of any of this occuring are extremely low.  By statistics, you have more of a chance of getting killed in a motor vehicle accident: while driving to the hospital than any of the above occurring .  Nevertheless, you should be aware that they are possibilities.  In general, it is similar to taking a shower.  Everybody knows that you can slip, hit your head and get killed.  Does that mean that you should not shower again?  Nevertheless always keep in mind that statistics do not mean anything if you happen to be on the wrong side of them.  Even if a procedure has a 1 (one) in a  1,000,000 (million) chance of going wrong, it you happen to be that one..Also, keep in mind that by statistics, you have more of a chance of having something go wrong when taking medications.  Who should not have this procedure? If you are on a blood thinning medication (e.g. Coumadin, Plavix, see list of "Blood Thinners"), or if you have an active infection going on, you should not have the procedure.  If you are taking any blood thinners, please inform your physician.  How should I prepare for this procedure? Do not eat or drink anything at least six hours prior to the procedure. Bring a driver with you .  It cannot be a taxi. Come accompanied by an adult that can drive you back, and that is strong enough to help you if your legs get weak or numb from the local anesthetic. Take all of your medicines the morning of the procedure with just enough water to swallow them. If you have diabetes, make sure that you are scheduled to have your procedure done first thing in the morning, whenever possible. If you have diabetes, take only half of your insulin dose and notify our nurse that you have done so as soon as you arrive at the clinic. If you are diabetic, but only take blood sugar pills (oral hypoglycemic), then do not take them on the morning of your procedure.  You may take them after you have had the procedure. Do not take aspirin or any aspirin-containing medications, at least eleven (11) days prior to the procedure.  They may prolong bleeding. Wear loose fitting clothing that may be easy to take off and that you would not mind if it got stained with Betadine or blood. Do not wear any jewelry or perfume Remove any nail coloring.  It will interfere with some of our monitoring equipment.  NOTE: Remember that this is not meant to be interpreted as a complete list of all possible complications.  Unforeseen problems may occur.  BLOOD THINNERS The following drugs contain aspirin or other products,  which can cause increased bleeding during surgery and should not be taken for 2 weeks prior to and 1 week after surgery.  If you should need take something for relief of minor pain, you may take acetaminophen which is found in Tylenol,m Datril, Anacin-3 and Panadol. It is not blood thinner. The products listed below are.  Do not take any of the products listed below in addition to any listed on your instruction sheet.  A.P.C or A.P.C with Codeine Codeine Phosphate Capsules #3 Ibuprofen Ridaura  ABC compound Congesprin Imuran rimadil  Advil Cope Indocin Robaxisal  Alka-Seltzer Effervescent Pain Reliever and Antacid Coricidin or Coricidin-D  Indomethacin Rufen  Alka-Seltzer plus Cold Medicine Cosprin Ketoprofen S-A-C Tablets  Anacin Analgesic Tablets or Capsules Coumadin   Korlgesic Salflex  Anacin Extra Strength Analgesic tablets or capsules CP-2 Tablets Lanoril Salicylate  Anaprox Cuprimine Capsules Levenox Salocol  Anexsia-D Dalteparin Magan Salsalate  Anodynos Darvon compound Magnesium Salicylate Sine-off  Ansaid Dasin Capsules Magsal Sodium Salicylate  Anturane Depen Capsules Marnal Soma  APF Arthritis pain formula Dewitt's Pills Measurin Stanback  Argesic Dia-Gesic Meclofenamic Sulfinpyrazone  Arthritis Bayer Timed Release Aspirin Diclofenac Meclomen Sulindac  Arthritis pain formula Anacin Dicumarol Medipren Supac  Analgesic (Safety coated) Arthralgen Diffunasal Mefanamic Suprofen  Arthritis Strength Bufferin Dihydrocodeine Mepro Compound Suprol  Arthropan liquid Dopirydamole Methcarbomol with Aspirin Synalgos  ASA tablets/Enseals Disalcid Micrainin Tagament  Ascriptin Doan's Midol Talwin  Ascriptin A/D Dolene Mobidin Tanderil  Ascriptin Extra Strength Dolobid Moblgesic Ticlid  Ascriptin with Codeine Doloprin or Doloprin with Codeine Momentum Tolectin  Asperbuf Duoprin Mono-gesic Trendar  Aspergum Duradyne Motrin or Motrin IB Triminicin  Aspirin plain, buffered or enteric coated  Durasal Myochrisine Trigesic  Aspirin Suppositories Easprin Nalfon Trillsate  Aspirin with Codeine Ecotrin Regular or Extra Strength Naprosyn Uracel  Atromid-S Efficin Naproxen Ursinus  Auranofin Capsules Elmiron Neocylate Vanquish  Axotal Emagrin Norgesic Verin  Azathioprine Empirin or Empirin with Codeine Normiflo Vitamin E  Azolid Emprazil Nuprin Voltaren  Bayer Aspirin plain, buffered or children's or timed BC Tablets or powders Encaprin Orgaran Warfarin Sodium  Buff-a-Comp Enoxaparin Orudis Zorpin  Buff-a-Comp with Codeine Equegesic Os-Cal-Gesic   Buffaprin Excedrin plain, buffered or Extra Strength Oxalid   Bufferin Arthritis Strength Feldene Oxphenbutazone   Bufferin plain or Extra Strength Feldene Capsules Oxycodone with Aspirin   Bufferin with Codeine Fenoprofen Fenoprofen Pabalate or Pabalate-SF   Buffets II Flogesic Panagesic   Buffinol plain or Extra Strength Florinal or Florinal with Codeine Panwarfarin   Buf-Tabs Flurbiprofen Penicillamine   Butalbital Compound Four-way cold tablets Penicillin   Butazolidin Fragmin Pepto-Bismol   Carbenicillin Geminisyn Percodan   Carna Arthritis Reliever Geopen Persantine   Carprofen Gold's salt Persistin   Chloramphenicol Goody's Phenylbutazone   Chloromycetin Haltrain Piroxlcam   Clmetidine heparin Plaquenil   Cllnoril Hyco-pap Ponstel   Clofibrate Hydroxy chloroquine Propoxyphen         Before stopping any of these medications, be sure to consult the physician who ordered them.  Some, such as Coumadin (Warfarin) are ordered to prevent or treat serious conditions such as "deep thrombosis", "pumonary embolisms", and other heart problems.  The amount of time that you may need off of the medication may also vary with the medication and the reason for which you were taking it.  If you are taking any of these medications, please make sure you notify your pain physician before you undergo any procedures.         Selective Nerve Root  Block Patient Information  Description: Specific nerve roots exit the spinal canal and these nerves can be compressed and inflamed by a bulging disc and bone spurs.  By injecting steroids on the nerve root, we can potentially decrease the inflammation surrounding these nerves, which often leads to decreased pain.  Also, by injecting local anesthesia on the nerve root, this can provide us helpful information to give to your referring doctor if it decreases your pain.  Selective nerve root blocks can be done along the spine from the neck to the low back depending on the location of your pain.   After numbing the skin with local anesthesia, a small needle is passed to the nerve root and the position of the needle is verified using x-ray pictures.  After the needle is   in correct position, we then deposit the medication.  You may experience a pressure sensation while this is being done.  The entire block usually lasts less than 15 minutes.  Conditions that may be treated with selective nerve root blocks: Low back and leg pain Spinal stenosis Diagnostic block prior to potential surgery Neck and arm pain Post laminectomy syndrome  Preparation for the injection:  Do not eat any solid food or dairy products within 8 hours of your appointment. You may drink clear liquids up to 3 hours before an appointment.  Clear liquids include water, black coffee, juice or soda.  No milk or cream please. You may take your regular medications, including pain medications, with a sip of water before your appointment.  Diabetics should hold regular insulin (if taken separately) and take 1/2 normal NPH dose the morning of the procedure.  Carry some sugar containing items with you to your appointment. A driver must accompany you and be prepared to drive you home after your procedure. Bring all your current medications with you. An IV may be inserted and sedation may be given at the discretion of the physician. A blood  pressure cuff, EKG, and other monitors will often be applied during the procedure.  Some patients may need to have extra oxygen administered for a short period. You will be asked to provide medical information, including allergies, prior to the procedure.  We must know immediately if you are taking blood  Thinners (like Coumadin) or if you are allergic to IV iodine contrast (dye).  Possible side-effects: All are usually temporary Bleeding from needle site Light headedness Numbness and tingling Decreased blood pressure Weakness in arms/legs Pressure sensation in back/neck Pain at injection site (several days)  Possible complications: All are extremely rare Infection Nerve injury Spinal headache (a headache wore with upright position)  Call if you experience: Fever/chills associated with headache or increased back/neck pain Headache worsened by an upright position New onset weakness or numbness of an extremity below the injection site Hives or difficulty breathing (go to the emergency room) Inflammation or drainage at the injection site(s) Severe back/neck pain greater than usual New symptoms which are concerning to you  Please note:  Although the local anesthetic injected can often make your back or neck feel good for several hours after the injection the pain will likely return.  It takes 3-5 days for steroids to work on the nerve root. You may not notice any pain relief for at least one week.  If effective, we will often do a series of 3 injections spaced 3-6 weeks apart to maximally decrease your pain.    If you have any questions, please call [phone] [facility] 

## 2021-12-05 ENCOUNTER — Other Ambulatory Visit: Payer: Medicare Other | Admitting: Urology

## 2021-12-06 ENCOUNTER — Encounter: Payer: Self-pay | Admitting: Student in an Organized Health Care Education/Training Program

## 2021-12-06 ENCOUNTER — Ambulatory Visit
Payer: Medicare Other | Attending: Student in an Organized Health Care Education/Training Program | Admitting: Student in an Organized Health Care Education/Training Program

## 2021-12-06 ENCOUNTER — Ambulatory Visit
Admission: RE | Admit: 2021-12-06 | Discharge: 2021-12-06 | Disposition: A | Discharge status: Home / Self Care | Payer: Medicare Other | Source: Ambulatory Visit | Attending: Student in an Organized Health Care Education/Training Program | Admitting: Student in an Organized Health Care Education/Training Program

## 2021-12-06 VITALS — BP 139/84 | HR 75 | Temp 97.3°F | Resp 19 | Ht 63.0 in | Wt 166.0 lb

## 2021-12-06 DIAGNOSIS — M47816 Spondylosis without myelopathy or radiculopathy, lumbar region: Secondary | ICD-10-CM | POA: Diagnosis present

## 2021-12-06 MED ORDER — LIDOCAINE HCL 2 % IJ SOLN
INTRAMUSCULAR | Status: AC
  Filled 2021-12-06: qty 20

## 2021-12-06 MED ORDER — DEXAMETHASONE SODIUM PHOSPHATE 10 MG/ML IJ SOLN
INTRAMUSCULAR | Status: AC
  Filled 2021-12-06: qty 2

## 2021-12-06 MED ORDER — LIDOCAINE HCL 2 % IJ SOLN
20.0000 mL | Freq: Once | INTRAMUSCULAR | Status: AC
  Administered 2021-12-06: 400 mg

## 2021-12-06 MED ORDER — ROPIVACAINE HCL 2 MG/ML IJ SOLN
9.0000 mL | Freq: Once | INTRAMUSCULAR | Status: AC
  Administered 2021-12-06: 9 mL via PERINEURAL

## 2021-12-06 MED ORDER — DEXAMETHASONE SODIUM PHOSPHATE 10 MG/ML IJ SOLN
10.0000 mg | Freq: Once | INTRAMUSCULAR | Status: AC
  Administered 2021-12-06: 10 mg

## 2021-12-06 MED ORDER — ROPIVACAINE HCL 2 MG/ML IJ SOLN
INTRAMUSCULAR | Status: AC
  Filled 2021-12-06: qty 20

## 2021-12-06 NOTE — Patient Instructions (Signed)

## 2021-12-06 NOTE — Progress Notes (Signed)
PROVIDER NOTE: Interpretation of information contained herein should be left to medically-trained personnel. Specific patient instructions are provided elsewhere under "Patient Instructions" section of medical record. This document was created in part using STT-dictation technology, any transcriptional errors that may result from this process are unintentional.  Patient: Leslie Carrillo Type: Established DOB: June 18, 1960 MRN: [id number] PCP: Enid Baas, MD  Service: Procedure DOS: 12/06/2021 Setting: Ambulatory Location: Ambulatory outpatient facility Delivery: Face-to-face Provider: Edward Jolly, MD Specialty: Interventional Pain Management Specialty designation: 09 Location: Outpatient facility Ref. Prov.: Enid Baas, MD    Procedure:           Type: Lumbar Facet, Medial Branch Block(s) #1  Laterality: Bilateral  Level: L3, L4, L5, Medial Branch Level(s). Injecting these levels blocks the L3-4 and L4-5 lumbar facet joints. Imaging: Fluoroscopic guidance         Anesthesia: Local anesthesia (1-2% Lidocaine) DOS: 12/06/2021 Performed by: Edward Jolly, MD  Primary Purpose: Diagnostic/Therapeutic Indications: Low back pain severe enough to impact quality of life or function. 1. Lumbar facet arthropathy   2. Lumbar spondylosis    NAS-11 Pain score:   Pre-procedure: 6 /10   Post-procedure: 0-No pain/10     Position / Prep / Materials:  Position: Prone  Prep solution: DuraPrep (Iodine Povacrylex [0.7% available iodine] and Isopropyl Alcohol, 74% w/w) Area Prepped: Posterolateral Lumbosacral Spine (Wide prep: From the lower border of the scapula down to the end of the tailbone and from flank to flank.)  Materials:  Tray: Block Needle(s):  Type: Spinal  Gauge (G): 22  Length: 3.5-in Qty: 2     Pre-op H&P Assessment:  Leslie Carrillo is a 61 y.o. (year old), female patient, seen today for interventional treatment. She  has a past surgical history that includes Cervical  fusion (2014); carpel tunnel (Right); Cholecystectomy; c sections (N/A, 1985); Colonoscopy; and Shoulder arthroscopy with subacromial decompression, rotator cuff repair and bicep tendon repair (Right, 11/08/2020). Leslie Carrillo has a current medication list which includes the following prescription(s): acetaminophen, buprenorphine, cholecalciferol, clobetasol, clonidine, oxycodone, and sertraline. Her primarily concern today is the Back Pain  Initial Vital Signs:  Pulse/HCG Rate: 75 ECG Heart Rate: (!) 58 Temp:  (!) 97.3 F (36.3 C) Resp: 16 BP: 127/62 SpO2: 100 %  BMI: Estimated body mass index is 29.41 kg/m as calculated from the following:   Height as of this encounter: 5\' 3"  (1.6 m).   Weight as of this encounter: 166 lb (75.3 kg).  Risk Assessment: Allergies: Reviewed. She is allergic to codeine, pregabalin, hydrocodone-acetaminophen, tramadol, latex, lovastatin, and simvastatin.  Allergy Precautions: None required Coagulopathies: Reviewed. None identified.  Blood-thinner therapy: None at this time Active Infection(s): Reviewed. None identified. Leslie Carrillo is afebrile  Site Confirmation: Leslie Carrillo was asked to confirm the procedure and laterality before marking the site Procedure checklist: Completed Consent: Before the procedure and under the influence of no sedative(s), amnesic(s), or anxiolytics, the patient was informed of the treatment options, risks and possible complications. To fulfill our ethical and legal obligations, as recommended by the American Medical Association's Code of Ethics, I have informed the patient of my clinical impression; the nature and purpose of the treatment or procedure; the risks, benefits, and possible complications of the intervention; the alternatives, including doing nothing; the risk(s) and benefit(s) of the alternative treatment(s) or procedure(s); and the risk(s) and benefit(s) of doing nothing. The patient was provided information about the general  risks and possible complications associated with the procedure. These may include, but are not  limited to: failure to achieve desired goals, infection, bleeding, organ or nerve damage, allergic reactions, paralysis, and death. In addition, the patient was informed of those risks and complications associated to Spine-related procedures, such as failure to decrease pain; infection (i.e.: Meningitis, epidural or intraspinal abscess); bleeding (i.e.: epidural hematoma, subarachnoid hemorrhage, or any other type of intraspinal or peri-dural bleeding); organ or nerve damage (i.e.: Any type of peripheral nerve, nerve root, or spinal cord injury) with subsequent damage to sensory, motor, and/or autonomic systems, resulting in permanent pain, numbness, and/or weakness of one or several areas of the body; allergic reactions; (i.e.: anaphylactic reaction); and/or death. Furthermore, the patient was informed of those risks and complications associated with the medications. These include, but are not limited to: allergic reactions (i.e.: anaphylactic or anaphylactoid reaction(s)); adrenal axis suppression; blood sugar elevation that in diabetics may result in ketoacidosis or comma; water retention that in patients with history of congestive heart failure may result in shortness of breath, pulmonary edema, and decompensation with resultant heart failure; weight gain; swelling or edema; medication-induced neural toxicity; particulate matter embolism and blood vessel occlusion with resultant organ, and/or nervous system infarction; and/or aseptic necrosis of one or more joints. Finally, the patient was informed that Medicine is not an exact science; therefore, there is also the possibility of unforeseen or unpredictable risks and/or possible complications that may result in a catastrophic outcome. The patient indicated having understood very clearly. We have given the patient no guarantees and we have made no promises. Enough  time was given to the patient to ask questions, all of which were answered to the patient's satisfaction. Leslie Carrillo has indicated that she wanted to continue with the procedure. Attestation: I, the ordering provider, attest that I have discussed with the patient the benefits, risks, side-effects, alternatives, likelihood of achieving goals, and potential problems during recovery for the procedure that I have provided informed consent. Date  Time: 12/06/2021 10:22 AM  Pre-Procedure Preparation:  Monitoring: As per clinic protocol. Respiration, ETCO2, SpO2, BP, heart rate and rhythm monitor placed and checked for adequate function Safety Precautions: Patient was assessed for positional comfort and pressure points before starting the procedure. Time-out: I initiated and conducted the "Time-out" before starting the procedure, as per protocol. The patient was asked to participate by confirming the accuracy of the "Time Out" information. Verification of the correct person, site, and procedure were performed and confirmed by me, the nursing staff, and the patient. "Time-out" conducted as per Joint Commission's Universal Protocol (UP.01.01.01). Time: 1126  Description of Procedure:          Laterality: Bilateral. The procedure was performed in identical fashion on both sides. Targeted Levels:  L3, L4, L5, Medial Branch Level(s)  Safety Precautions: Aspiration looking for blood return was conducted prior to all injections. At no point did we inject any substances, as a needle was being advanced. Before injecting, the patient was told to immediately notify me if she was experiencing any new onset of "ringing in the ears, or metallic taste in the mouth". No attempts were made at seeking any paresthesias. Safe injection practices and needle disposal techniques used. Medications properly checked for expiration dates. SDV (single dose vial) medications used. After the completion of the procedure, all disposable  equipment used was discarded in the proper designated medical waste containers. Local Anesthesia: Protocol guidelines were followed. The patient was positioned over the fluoroscopy table. The area was prepped in the usual manner. The time-out was completed. The target area was  identified using fluoroscopy. A 12-in long, straight, sterile hemostat was used with fluoroscopic guidance to locate the targets for each level blocked. Once located, the skin was marked with an approved surgical skin marker. Once all sites were marked, the skin (epidermis, dermis, and hypodermis), as well as deeper tissues (fat, connective tissue and muscle) were infiltrated with a small amount of a short-acting local anesthetic, loaded on a 10cc syringe with a 25G, 1.5-in  Needle. An appropriate amount of time was allowed for local anesthetics to take effect before proceeding to the next step. Local Anesthetic: Lidocaine 2.0% The unused portion of the local anesthetic was discarded in the proper designated containers. Technical description of process:  L3 Medial Branch Nerve Block (MBB): The target area for the L3 medial branch is at the junction of the postero-lateral aspect of the superior articular process and the superior, posterior, and medial edge of the transverse process of L4. Under fluoroscopic guidance, a Quincke needle was inserted until contact was made with os over the superior postero-lateral aspect of the pedicular shadow (target area). After negative aspiration for blood, 2mL of the nerve block solution was injected without difficulty or complication. The needle was removed intact. L4 Medial Branch Nerve Block (MBB): The target area for the L4 medial branch is at the junction of the postero-lateral aspect of the superior articular process and the superior, posterior, and medial edge of the transverse process of L5. Under fluoroscopic guidance, a Quincke needle was inserted until contact was made with os over the  superior postero-lateral aspect of the pedicular shadow (target area). After negative aspiration for blood, 2mL of the nerve block solution was injected without difficulty or complication. The needle was removed intact. L5 Medial Branch Nerve Block (MBB): The target area for the L5 medial branch is at the junction of the postero-lateral aspect of the superior articular process and the superior, posterior, and medial edge of the sacral ala. Under fluoroscopic guidance, a Quincke needle was inserted until contact was made with os over the superior postero-lateral aspect of the pedicular shadow (target area). After negative aspiration for blood, 2mL of the nerve block solution was injected without difficulty or complication. The needle was removed intact.   12 cc solution made of 10 cc of 0.2% ropivacaine, 2 cc of Decadron 10 mg/cc.  2 cc injected at each level above bilaterally.    Once the entire procedure was completed, the treated area was cleaned, making sure to leave some of the prepping solution back to take advantage of its long term bactericidal properties.         Illustration of the posterior view of the lumbar spine and the posterior neural structures. Laminae of L2 through S1 are labeled. DPRL5, dorsal primary ramus of L5; DPRS1, dorsal primary ramus of S1; DPR3, dorsal primary ramus of L3; FJ, facet (zygapophyseal) joint L3-L4; I, inferior articular process of L4; LB1, lateral branch of dorsal primary ramus of L1; IAB, inferior articular branches from L3 medial branch (supplies L4-L5 facet joint); IBP, intermediate branch plexus; MB3, medial branch of dorsal primary ramus of L3; NR3, third lumbar nerve root; S, superior articular process of L5; SAB, superior articular branches from L4 (supplies L4-5 facet joint also); TP3, transverse process of L3.  Vitals:   12/06/21 1122 12/06/21 1127 12/06/21 1132 12/06/21 1134  BP: 129/76 131/79 126/67 139/84  Pulse:      Resp: Temp:       SpO2: 97% 100% 98%  97%  Weight:      Height:         Start Time: 1126 hrs. End Time: 1133 hrs.  Imaging Guidance (Spinal):          Type of Imaging Technique: Fluoroscopy Guidance (Spinal) Indication(s): Assistance in needle guidance and placement for procedures requiring needle placement in or near specific anatomical locations not easily accessible without such assistance. Exposure Time: Please see nurses notes. Contrast: None used. Fluoroscopic Guidance: I was personally present during the use of fluoroscopy. "Tunnel Vision Technique" used to obtain the best possible view of the target area. Parallax error corrected before commencing the procedure. "Direction-depth-direction" technique used to introduce the needle under continuous pulsed fluoroscopy. Once target was reached, antero-posterior, oblique, and lateral fluoroscopic projection used confirm needle placement in all planes. Images permanently stored in EMR. Interpretation: No contrast injected. I personally interpreted the imaging intraoperatively. Adequate needle placement confirmed in multiple planes. Permanent images saved into the patient's record.  Antibiotic Prophylaxis:   Anti-infectives (From admission, onward)    None      Indication(s): None identified  Post-operative Assessment:  Post-procedure Vital Signs:  Pulse/HCG Rate: 75 62 Temp:  (!) 97.3 F (36.3 C) Resp: 19 BP: 139/84 SpO2: 97 %  EBL: None  Complications: No immediate post-treatment complications observed by team, or reported by patient.  Note: The patient tolerated the entire procedure well. A repeat set of vitals were taken after the procedure and the patient was kept under observation following institutional policy, for this type of procedure. Post-procedural neurological assessment was performed, showing return to baseline, prior to discharge. The patient was provided with post-procedure discharge instructions, including a section on how  to identify potential problems. Should any problems arise concerning this procedure, the patient was given instructions to immediately contact us, at any time, without hesitation. In any case, we plan to contact the patient by telephone for a follow-up status report regarding this interventional procedure.  Comments:  No additional relevant information.  Plan of Care  Orders:  Orders Placed This Encounter  Procedures   DG PAIN [facility] C-ARM 1-60 MIN NO REPORT    Intraoperative interpretation by procedural physician at [facility].    Standing Status:   Standing    Number of Occurrences:   1    Order Specific Question:   Reason for exam:    Answer:   Assistance in needle guidance and placement for procedures requiring needle placement in or near specific anatomical locations not easily accessible without such assistance.   Chronic Opioid Analgesic:  Buprenorphine transdermal patch, 15 mcg an hour, oxycodone 5 mg daily as needed for breakthrough pain, quantity #30 to last for 90 days MME/day: 10 mg/day   Medications ordered for procedure: Meds ordered this encounter  Medications   lidocaine (XYLOCAINE) 2 % (with pres) injection 400 mg   dexamethasone (DECADRON) injection 10 mg   dexamethasone (DECADRON) injection 10 mg   ropivacaine (PF) 2 mg/mL (0.2%) (NAROPIN) injection 9 mL   ropivacaine (PF) 2 mg/mL (0.2%) (NAROPIN) injection 9 mL   Medications administered: We administered lidocaine, dexamethasone, dexamethasone, ropivacaine (PF) 2 mg/mL (0.2%), and ropivacaine (PF) 2 mg/mL (0.2%).  See the medical record for exact dosing, route, and time of administration.  Follow-up plan:   Return for Post Procedure Evaluation, virtual.     Recent Visits Date Type Provider Dept  11/21/21 Office Visit Edward Jolly, MD [facility]  10/23/21 Procedure visit Edward Jolly, MD [facility]  10/16/21 Office Visit Edward JollyLateef, Ellington Greenslade, MD [facility]  09/13/21  Procedure visit Edward JollyLateef, Eugene Zeiders, MD [facility]  Showing recent visits within past 90 days and meeting all other requirements Today's Visits Date Type Provider Dept  12/06/21 Procedure visit Edward JollyLateef, Davionne Dowty, MD [facility]  Showing today's visits and meeting all other requirements Future Appointments Date Type Provider Dept  01/03/22 Appointment Edward JollyLateef, Danyella Mcginty, MD [facility]  02/06/22 Appointment Edward JollyLateef, Edmundo Tedesco, MD [facility]  Showing future appointments within next 90 days and meeting all other requirements  Disposition: Discharge home  Discharge (Date  Time): 12/06/2021; 1144 hrs.   Primary Care Physician: Enid BaasKalisetti, Radhika, MD [facility] Note by: Edward JollyBilal Myka Lukins, MD Date: 12/06/2021; Time: 11:57 AM  Disclaimer:  Medicine is not an exact science. The only guarantee in medicine is that nothing is guaranteed. It is important to note that the decision to proceed with this intervention was based on the information collected from the patient. The Data and conclusions were drawn from the patient's questionnaire, the interview, and the physical examination. Because the information was provided in large part by the patient, it cannot be guaranteed that it has not been purposely or unconsciously manipulated. Every effort has been made to obtain as much relevant data as possible for this evaluation. It is important to note that the conclusions that lead to this procedure are derived in large part from the available data. Always take into account that the treatment will also be dependent on availability of resources and existing treatment guidelines, considered by other Pain Management Practitioners as being common knowledge and practice, at the time of the intervention. For Medico-Legal purposes, it is also important to point out that variation in procedural techniques and pharmacological choices are the acceptable norm. The  indications, contraindications, technique, and results of the above procedure should only be interpreted and judged by a Board-Certified Interventional Pain Specialist with extensive familiarity and expertise in the same exact procedure and technique.

## 2021-12-06 NOTE — Progress Notes (Signed)
Safety precautions to be maintained throughout the outpatient stay will include: orient to surroundings, keep bed in low position, maintain call bell within reach at all times, provide assistance with transfer out of bed and ambulation.  

## 2021-12-07 ENCOUNTER — Telehealth: Payer: Self-pay

## 2021-12-07 NOTE — Telephone Encounter (Signed)
Post procedure phone call. Patient states she is doing good.  

## 2022-01-01 ENCOUNTER — Telehealth: Payer: Self-pay

## 2022-01-01 NOTE — Telephone Encounter (Signed)
LM for patient to call back to go over pre virtual appointment questions

## 2022-01-03 ENCOUNTER — Ambulatory Visit
Payer: Medicare Other | Attending: Student in an Organized Health Care Education/Training Program | Admitting: Student in an Organized Health Care Education/Training Program

## 2022-01-03 ENCOUNTER — Encounter: Payer: Self-pay | Admitting: Student in an Organized Health Care Education/Training Program

## 2022-01-03 DIAGNOSIS — M48061 Spinal stenosis, lumbar region without neurogenic claudication: Secondary | ICD-10-CM

## 2022-01-03 DIAGNOSIS — G894 Chronic pain syndrome: Secondary | ICD-10-CM

## 2022-01-03 DIAGNOSIS — M5416 Radiculopathy, lumbar region: Secondary | ICD-10-CM

## 2022-01-03 DIAGNOSIS — M47816 Spondylosis without myelopathy or radiculopathy, lumbar region: Secondary | ICD-10-CM | POA: Diagnosis not present

## 2022-01-03 MED ORDER — PREDNISONE 20 MG PO TABS: ORAL_TABLET | ORAL | 0 refills | Status: AC

## 2022-01-03 NOTE — Progress Notes (Signed)
Patient: Leslie Carrillo  Service Category: E/M  Provider: Gillis Santa, MD  DOB: 06-24-1960  DOS: 01/03/2022  Location: Office  MRN: [id number]  Setting: Ambulatory outpatient  Referring Provider: Gladstone Lighter, MD  Type: Established Patient  Specialty: Interventional Pain Management  PCP: Gladstone Lighter, MD  Location: Remote location  Delivery: TeleHealth     Virtual Encounter - Pain Management PROVIDER NOTE: Information contained herein reflects review and annotations entered in association with encounter. Interpretation of such information and data should be left to medically-trained personnel. Information provided to patient can be located elsewhere in the medical record under "Patient Instructions". Document created using STT-dictation technology, any transcriptional errors that may result from process are unintentional.    Contact & Pharmacy Preferred: [phone] Home: [phone] (home) Mobile: [phone] (mobile) E-mail: [email or web address]  [id number] [address] [place] [address] Phone: [phone] Fax: [phone]   Pre-screening  Leslie Carrillo offered "in-person" vs "virtual" encounter. She indicated preferring virtual for this encounter.   Reason COVID-19*  Social distancing based on CDC and AMA recommendations.   I contacted Leslie Carrillo on 01/03/2022 via telephone.      I clearly identified myself as Gillis Santa, MD. I verified that I was speaking with the correct person using two identifiers (Name: VERENIS NICOSIA, and date of birth: [birth year]).  Consent I sought verbal advanced consent from Leslie Carrillo for virtual visit interactions. I informed Leslie Carrillo of possible security and privacy concerns, risks, and limitations associated with providing "not-in-person" medical evaluation and management services. I also informed Leslie Carrillo of the availability of  "in-person" appointments. Finally, I informed her that there would be a charge for the virtual visit and that she could be  personally, fully or partially, financially responsible for it. Ms. Dorner expressed understanding and agreed to proceed.   Historic Elements   Leslie Carrillo is a 61 y.o. year old, female patient evaluated today after our last contact on 12/06/2021. Leslie Carrillo  has a past medical history of Anemia, Arthritis, Asthma, Chronic kidney disease, Chronic pain, and History of kidney stones. She also  has a past surgical history that includes Cervical fusion (2014); carpel tunnel (Right); Cholecystectomy; c sections (N/A, 1985); Colonoscopy; and Shoulder arthroscopy with subacromial decompression, rotator cuff repair and bicep tendon repair (Right, 11/08/2020). Leslie Carrillo has a current medication list which includes the following prescription(s): acetaminophen, buprenorphine, cholecalciferol, clobetasol, clonidine, prednisone, and sertraline. She  reports that she has been smoking cigarettes. She has a 10.00 pack-year smoking history. She has never used smokeless tobacco. She reports that she does not drink alcohol and does not use drugs. Leslie Carrillo is allergic to codeine, pregabalin, hydrocodone-acetaminophen, tramadol, latex, lovastatin, and simvastatin.   HPI  Today, she is being contacted for a post-procedure assessment.   Post-procedure evaluation   Type: Lumbar Facet, Medial Branch Block(s) #1  Laterality: Bilateral  Level: L3, L4, L5, Medial Branch Level(s). Injecting these levels blocks the L3-4 and L4-5 lumbar facet joints. Imaging: Fluoroscopic guidance         Anesthesia: Local anesthesia (1-2% Lidocaine) DOS: 12/06/2021 Performed by: Gillis Santa, MD  Primary Purpose: Diagnostic/Therapeutic Indications: Low back pain severe enough to impact quality of life or function. 1. Lumbar facet arthropathy   2. Lumbar spondylosis    NAS-11 Pain score:   Pre-procedure: 6 /10    Post-procedure: 0-No pain/10  Effectiveness:  Initial hour after procedure: 100 %  Subsequent 4-6 hours post-procedure: 100 %  Analgesia past initial 6 hours: 75% for first week then gradually reduced 40 % (states that the procedure did very well and she received pain relief.  the pain is just now starting to go back up.)  Ongoing improvement:  Analgesic:  50% Function: Leslie Carrillo reports improvement in function ROM: Leslie Carrillo reports improvement in ROM   Pharmacotherapy Assessment   Opioid Analgesic: Buprenorphine transdermal patch, 15 mcg an hour, oxycodone 5 mg daily as needed for breakthrough pain, quantity #30 to last for 90 days MME/day: 10 mg/day   Monitoring: [place] PMP: PDMP reviewed during this encounter.       Pharmacotherapy: No side-effects or adverse reactions reported. Compliance: No problems identified. Effectiveness: Clinically acceptable. Plan: Refer to "POC". UDS:  Summary  Date Value Ref Range Status  03/09/2021 Note  Final    Comment:    ==================================================================== [employer] Select 13 (MW) ==================================================================== Test                             Result       Flag       Units  Drug Present and Declared for Prescription Verification   Buprenorphine                  28           EXPECTED   ng/mg creat   Norbuprenorphine               35           EXPECTED   ng/mg creat    Source of buprenorphine is a scheduled prescription medication.    Norbuprenorphine is an expected metabolite of buprenorphine.  Drug Absent but Declared for Prescription Verification   Oxycodone                      Not Detected UNEXPECTED ng/mg creat ==================================================================== Test                      Result    Flag   Units      Ref Range   Creatinine              153              mg/dL       >=20 ==================================================================== Declared Medications:  The flagging and interpretation on this report are based on the  following declared medications.  Unexpected results may arise from  inaccuracies in the declared medications.   **Note: The testing scope of this panel includes these medications:   Oxycodone (Roxicodone)   **Note: The testing scope of this panel does not include small to  moderate amounts of these reported medications:   Buprenorphine Patch (BuTrans)   **Note: The testing scope of this panel does not include the  following reported medications:   Acetaminophen (Tylenol)  Ondansetron (Zofran)  Sertraline (Zoloft) ==================================================================== For clinical consultation, please call [phone]. ====================================================================    No results found for: "CBDTHCR", "D8THCCBX", "D9THCCBX"   Laboratory Chemistry Profile   Renal Lab Results  Component Value Date   BUN 11 11/08/2020   CREATININE 0.97 11/08/2020   GFRAA >60 10/16/2019   GFRNONAA >60 11/08/2020    Hepatic Lab Results  Component Value Date   AST 20 10/16/2019   ALT 15 10/16/2019   ALBUMIN  4.0 10/16/2019   ALKPHOS 75 10/16/2019   LIPASE 29 10/16/2019    Electrolytes Lab Results  Component Value Date   NA 137 11/08/2020   K 3.9 11/08/2020   CL 102 11/08/2020   CALCIUM 9.0 11/08/2020    Bone No results found for: "VD25OH", "VD125OH2TOT", "DJ2426ST4", "HD6222LN9", "25OHVITD1", "25OHVITD2", "25OHVITD3", "TESTOFREE", "TESTOSTERONE"  Inflammation (CRP: Acute Phase) (ESR: Chronic Phase) No results found for: "CRP", "ESRSEDRATE", "LATICACIDVEN"       Note: Above Lab results reviewed.  Assessment  The primary encounter diagnosis was Lumbar radicular pain. 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   Leslie Carrillo has a current medication list which includes the following long-term medication(s): clonidine and sertraline.  Increased pain down right leg, related to lumbar radiculopathy.  She previously had a lumbar epidural steroid injection on 10/23/2021.  This was helpful for her radicular pain.  I discussed repeat lumbar ESI versus oral steroid taper.  Patient states that she would like to try prednisone taper initially and if that is not helpful she will consider repeating lumbar ESI.  Lumbar axial low back pain improved after diagnostic lumbar facet medial branch nerve blocks.  Consider repeating/RFA in future  Pharmacotherapy (Medications Ordered): Meds ordered this encounter  Medications   predniSONE (DELTASONE) 20 MG tablet    Sig: Take 3 tablets (60 mg total) by mouth daily with breakfast for 3 days, THEN 2 tablets (40 mg total) daily with breakfast for 3 days, THEN 1 tablet (20 mg total) daily with breakfast for 3 days.    Dispense:  18 tablet    Refill:  0   Orders:  No orders of the defined types were placed in this encounter.  Follow-up plan:   Return if symptoms worsen or fail to improve.     Has tried cervical and thoracic epidural steroid injections with physiatry at [facility].  Consider thoracic TPI.  Consider lumbar facet medial branch nerve blocks for severe facet arthropathy lower lumbar spine present on x-ray.  Could also consider knee intra-articular steroid, Hyalgan, genicular nerve block. S/p right shoulder injection 12/23/19, 02/03/20 right shoulder MRI-supraspinatus tendon tear with infraspinatus tendinopathy, has been told that she needs to have right shoulder surgery, tentative plan early August.     Recent Visits Date Type Provider Dept  12/06/21 Procedure visit Gillis Santa, MD [facility]  11/21/21 Office Visit Gillis Santa, MD [facility]  10/23/21 Procedure visit Gillis Santa,  MD [facility]  10/16/21 Office Visit Gillis Santa, MD [facility]  Showing recent visits within past 90 days and meeting all other requirements Today's Visits Date Type Provider Dept  01/03/22 Office Visit Gillis Santa, MD [facility]  Showing today's visits and meeting all other requirements Future Appointments Date Type Provider Dept  02/06/22 Appointment Gillis Santa, MD [facility]  Showing future appointments within next 90 days and meeting all other requirements  I discussed the assessment and treatment plan with the patient. The patient was provided an opportunity to ask questions and all were answered. The patient agreed with the plan and demonstrated an understanding of the instructions.  Patient advised to call back or seek an in-person evaluation if the symptoms or condition worsens.  Duration of encounter: 73minutes.  Note by: Gillis Santa, MD Date: 01/03/2022; Time: 3:56 PM

## 2022-02-06 ENCOUNTER — Ambulatory Visit
Payer: Medicare Other | Attending: Student in an Organized Health Care Education/Training Program | Admitting: Student in an Organized Health Care Education/Training Program

## 2022-02-06 ENCOUNTER — Encounter: Payer: Self-pay | Admitting: Student in an Organized Health Care Education/Training Program

## 2022-02-06 VITALS — BP 148/78 | HR 63 | Temp 97.2°F | Ht 63.0 in | Wt 166.0 lb

## 2022-02-06 DIAGNOSIS — G894 Chronic pain syndrome: Secondary | ICD-10-CM | POA: Diagnosis present

## 2022-02-06 DIAGNOSIS — M48061 Spinal stenosis, lumbar region without neurogenic claudication: Secondary | ICD-10-CM | POA: Insufficient documentation

## 2022-02-06 DIAGNOSIS — M5416 Radiculopathy, lumbar region: Secondary | ICD-10-CM | POA: Insufficient documentation

## 2022-02-06 MED ORDER — OXYCODONE HCL 5 MG PO TABS: 5.0000 mg | ORAL_TABLET | Freq: Every day | ORAL | 0 refills | Status: AC | PRN

## 2022-02-06 MED ORDER — BUPRENORPHINE 15 MCG/HR TD PTWK: 1.0000 | MEDICATED_PATCH | TRANSDERMAL | 2 refills | Status: DC

## 2022-02-06 NOTE — Progress Notes (Signed)
Nursing Pain Medication Assessment:  Safety precautions to be maintained throughout the outpatient stay will include: orient to surroundings, keep bed in low position, maintain call bell within reach at all times, provide assistance with transfer out of bed and ambulation.  Medication Inspection Compliance: Pill count conducted under aseptic conditions, in front of the patient. Neither the pills nor the bottle was removed from the patient's sight at any time. Once count was completed pills were immediately returned to the patient in their original bottle.  Medication #1: Oxycodone IR Pill/Patch Count:  19 of 30 pills remain Pill/Patch Appearance: Markings consistent with prescribed medication Bottle Appearance: Standard pharmacy container. Clearly labeled. Filled Date: 9 / 5 / 2023 Last Medication intake:yesterday  Medication #2: Buprenorphine (Suboxone) Pill/Patch Count:  1 of 4 pills remain Pill/Patch Appearance: Markings consistent with prescribed medication Bottle Appearance: Standard pharmacy container. Clearly labeled. Filled Date: 36 / 27 / 2023 Last Medication intake:  TodaySafety precautions to be maintained throughout the outpatient stay will include: orient to surroundings, keep bed in low position, maintain call bell within reach at all times, provide assistance with transfer out of bed and ambulation.

## 2022-02-06 NOTE — Progress Notes (Signed)
PROVIDER NOTE: Information contained herein reflects review and annotations entered in association with encounter. Interpretation of such information and data should be left to medically-trained personnel. Information provided to patient can be located elsewhere in the medical record under "Patient Instructions". Document created using STT-dictation technology, any transcriptional errors that may result from process are unintentional.    Patient: Leslie Carrillo  Service Category: E/M  Provider: Gillis Santa, MD  DOB: 1960/12/21  DOS: 02/06/2022  Specialty: Interventional Pain Management  MRN: [id number]  Setting: Ambulatory outpatient  PCP: Gladstone Lighter, MD  Type: Established Patient    Referring Provider: Gladstone Lighter, MD  Location: Office  Delivery: Face-to-face     HPI  [name], a 61 y.o. year old female, is here today because of her Lumbar radicular pain [M54.16]. [name] primary complain today is Back Pain (Lower )   Last encounter: My last encounter with her was on 12/31/21  Pertinent problems: [name] has Chronic low back pain; DDD (degenerative disc disease), thoracic; Lumbar radiculopathy; Cervical facet joint syndrome; Bilateral primary osteoarthritis of knee; Lumbar degenerative disc disease; Chronic pain syndrome; Myofascial pain syndrome; and Lumbar facet arthropathy on their pertinent problem list. Pain Assessment: Severity of Chronic pain is reported as a 6 /10. Location: Back (right hip and knee) Right, Lower/radiates down leg. Onset: More than a month ago. Quality: Constant. Timing: Constant. Modifying factor(s): denies. Vitals:  height is _0  (1.6 m) and weight is 166 lb (75.3 kg). Her temporal temperature is 97.2 F (36.2 C) (abnormal). Her blood pressure is 148/78 (abnormal) and her pulse is 63. Her oxygen saturation is 100%.   Reason for encounter: medication management.    Increased low back pain with radiation into   right > left hip and leg,  previous right L4-5 ESI 10/23/21 which provided 65% pain relief for approx 3.5 months with return of pain  Patient states that the pain does radiate  She takes oxycodone strictly on a as needed basis and has been utilizing it more frequently given recent lumbar radicular pain. X-rays from 2020 shows severe facet arthropathy resulting in neuroforaminal stenosis that could be causing radicular pain. Refill Butrans patch and oxycodone as below.  No change in dose.   Pharmacotherapy Assessment  Analgesic: Buprenorphine transdermal patch, 15 mcg an hour, oxycodone 5 mg daily as needed for breakthrough pain, quantity #30 to last for 90 days MME/day: 10 mg/day   Monitoring: [place] PMP: PDMP reviewed during this encounter.       Pharmacotherapy: No side-effects or adverse reactions reported. Compliance: No problems identified. Effectiveness: Clinically acceptable.  UDS:  Summary  Date Value Ref Range Status  03/09/2021 Note  Final    Comment:    ==================================================================== [employer] Select 13 (MW) ==================================================================== Test                             Result       Flag       Units  Drug Present and Declared for Prescription Verification   Buprenorphine                  28           EXPECTED   ng/mg creat   Norbuprenorphine               35           EXPECTED   ng/mg creat    Source of  buprenorphine is a scheduled prescription medication.    Norbuprenorphine is an expected metabolite of buprenorphine.  Drug Absent but Declared for Prescription Verification   Oxycodone                      Not Detected UNEXPECTED ng/mg creat ==================================================================== Test                      Result    Flag   Units      Ref Range   Creatinine              153              mg/dL      >=20 ==================================================================== Declared Medications:  The  flagging and interpretation on this report are based on the  following declared medications.  Unexpected results may arise from  inaccuracies in the declared medications.   **Note: The testing scope of this panel includes these medications:   Oxycodone (Roxicodone)   **Note: The testing scope of this panel does not include small to  moderate amounts of these reported medications:   Buprenorphine Patch (BuTrans)   **Note: The testing scope of this panel does not include the  following reported medications:   Acetaminophen (Tylenol)  Ondansetron (Zofran)  Sertraline (Zoloft) ==================================================================== For clinical consultation, please call [phone]. ====================================================================       ROS  Constitutional: Denies any fever or chills Gastrointestinal: No reported hemesis, hematochezia, vomiting, or acute GI distress Musculoskeletal: R> L hip, leg pain Neurological: No reported episodes of acute onset apraxia, aphasia, dysarthria, agnosia, amnesia, paralysis, loss of coordination, or loss of consciousness  Medication Review  Cholecalciferol, acetaminophen, buprenorphine, cloNIDine, clobetasol, oxyCODONE, and sertraline  History Review  Allergy: Leslie Carrillo is allergic to codeine, pregabalin, hydrocodone-acetaminophen, tramadol, latex, lovastatin, and simvastatin. Drug: Leslie Carrillo  reports no history of drug use. Alcohol:  reports no history of alcohol use. Tobacco:  reports that she has been smoking cigarettes. She has a 10.00 pack-year smoking history. She has never used smokeless tobacco. Social: Leslie Carrillo  reports that she has been smoking cigarettes. She has a 10.00 pack-year smoking history. She has never used smokeless tobacco. She reports that she does not drink alcohol and does not use drugs. Medical:  has a past medical history of Anemia, Arthritis, Asthma, Chronic kidney disease, Chronic  pain, and History of kidney stones. Surgical: Leslie Carrillo  has a past surgical history that includes Cervical fusion (2014); carpel tunnel (Right); Cholecystectomy; c sections (N/A, 1985); Colonoscopy; and Shoulder arthroscopy with subacromial decompression, rotator cuff repair and bicep tendon repair (Right, 11/08/2020). Family: family history includes Dementia in her mother; Prostate cancer in her father; Renal Cyst in her father.  Laboratory Chemistry Profile   Renal Lab Results  Component Value Date   BUN 11 11/08/2020   CREATININE 0.97 11/08/2020   GFRAA >60 10/16/2019   GFRNONAA >60 11/08/2020     Hepatic Lab Results  Component Value Date   AST 20 10/16/2019   ALT 15 10/16/2019   ALBUMIN 4.0 10/16/2019   ALKPHOS 75 10/16/2019   LIPASE 29 10/16/2019     Electrolytes Lab Results  Component Value Date   NA 137 11/08/2020   K 3.9 11/08/2020   CL 102 11/08/2020   CALCIUM 9.0 11/08/2020     Bone No results found for: "VD25OH", "VD125OH2TOT", "CB6384TX6", "IW8032ZY2", "25OHVITD1", "25OHVITD2", "25OHVITD3", "TESTOFREE", "TESTOSTERONE"   Inflammation (CRP: Acute Phase) (ESR: Chronic Phase)  No results found for: "CRP", "ESRSEDRATE", "LATICACIDVEN"     Note: Above Lab results reviewed.  Recent Imaging Review  DG PAIN CLINIC C-ARM 1-60 MIN NO REPORT Fluoro was used, but no Radiologist interpretation will be provided.  Please refer to "NOTES" tab for provider progress note.  CLINICAL DATA:  Low back pain extending into the hips bilaterally.   EXAM: RIGHT KNEE - 1-2 VIEW; LEFT KNEE - 1-2 VIEW; DG HIP (WITH OR WITHOUT PELVIS) 2-3V LEFT; LUMBAR SPINE - COMPLETE WITH BENDING VIEWS; DG HIP (WITH OR WITHOUT PELVIS) 2-3V RIGHT   COMPARISON:  MRI lumbar spine 01/09/2012   FINDINGS: Non rib-bearing lumbar type vertebral bodies are present. Progressive levoconvex curvature of the lumbar spine is centered at L2-3. Compensatory rightward curvature is present at L5.  Slight retrolisthesis is present at L2-3 and L3-4. There is slight anterolisthesis at L4-5. Marked facet hypertrophy is present from L3-4 through L5-S1.   Atherosclerotic calcifications are present in the aorta.   There is no significant change in listhesis associated with flexion or extension.   IMPRESSION: 1. 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 2. Retrolisthesis at L3-4 and anterolisthesis at L4-5 is new. 3. Significant facet hypertrophy at L3-4 and L4-5 in particular may contribute to radicular symptoms.     Electronically Signed   By: San Morelle M.D.   On: 04/01/2019 06:02  Note: Reviewed        Physical Exam  General appearance: Well nourished, well developed, and well hydrated. In no apparent acute distress Mental status: Alert, oriented x 3 (person, place, & time)       Respiratory: No evidence of acute respiratory distress Eyes: PERLA Vitals: BP (!) 148/78 (BP Location: Right Arm, Patient Position: Sitting, Cuff Size: Normal)   Pulse 63   Temp (!) 97.2 F (36.2 C) (Temporal)   Ht _0  (1.6 m)   Wt 166 lb (75.3 kg)   SpO2 100%   BMI 29.41 kg/m  BMI: Estimated body mass index is 29.41 kg/m as calculated from the following:   Height as of this encounter: _1  (1.6 m).   Weight as of this encounter: 166 lb (75.3 kg). Ideal: Ideal body weight: 52.4 kg (115 lb 8.3 oz) Adjusted ideal body weight: 61.6 kg (135 lb 11.4 oz)  Lumbar Spine Area Exam  Skin & Axial Inspection: No masses, redness, or swelling Alignment: Symmetrical Functional ROM: Pain restricted ROM affecting both sides right greater than left Stability: No instability detected Muscle Tone/Strength: Functionally intact. No obvious neuro-muscular anomalies detected. Sensory (Neurological): Dermatomal pain pattern right greater than left  Gait & Posture Assessment  Ambulation: Unassisted Gait: Relatively normal for age and body habitus Posture: WNL  Lower Extremity Exam     Side: Right lower extremity  Side: Left lower extremity  Stability: No instability observed          Stability: No instability observed          Skin & Extremity Inspection: Skin color, temperature, and hair growth are WNL. No peripheral edema or cyanosis. No masses, redness, swelling, asymmetry, or associated skin lesions. No contractures.  Skin & Extremity Inspection: Skin color, temperature, and hair growth are WNL. No peripheral edema or cyanosis. No masses, redness, swelling, asymmetry, or associated skin lesions. No contractures.  Functional ROM: Pain restricted ROM for hip and knee joints          Functional ROM: Unrestricted ROM  Muscle Tone/Strength: Functionally intact. No obvious neuro-muscular anomalies detected.  Muscle Tone/Strength: Functionally intact. No obvious neuro-muscular anomalies detected.  Sensory (Neurological): Dermatomal pain pattern        Sensory (Neurological): Unimpaired        DTR: Patellar: deferred today Achilles: deferred today Plantar: deferred today  DTR: Patellar: deferred today Achilles: deferred today Plantar: deferred today  Palpation: No palpable anomalies  Palpation: No palpable anomalies     Assessment   Status Diagnosis  Having a Flare-up Persistent Having a Flare-up 1. Lumbar radicular pain   2. 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 Lumbar radiculopathy   4. Chronic pain syndrome         Plan of Care   Ms. NASHAY BRICKLEY has a current medication list which includes the following long-term medication(s): clonidine and sertraline.  1. Lumbar radicular pain - Lumbar Epidural Injection; Future - buprenorphine (BUTRANS) 15 MCG/HR; Place 1 patch onto the skin once a week. For chronic pain syndrome  Dispense: 4 patch; Refill: 2  2. 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  For chronic pain syndrome  Dispense: 4 patch; Refill: 2  3. Lumbar radiculopathy - Lumbar Epidural Injection; Future - buprenorphine (BUTRANS) 15 MCG/HR; Place 1 patch onto the skin once a week. For chronic pain syndrome  Dispense: 4 patch; Refill: 2  4. Chronic pain syndrome - Lumbar Epidural Injection; Future - buprenorphine (BUTRANS) 15 MCG/HR; Place 1 patch onto the skin once a week. For chronic pain syndrome  Dispense: 4 patch; Refill: 2 - [employer] Select 13 (MW), Urine    Pharmacotherapy (Medications Ordered): Meds ordered this encounter  Medications   buprenorphine (BUTRANS) 15 MCG/HR    Sig: Place 1 patch onto the skin once a week. For chronic pain syndrome    Dispense:  4 patch    Refill:  2   oxyCODONE (OXY IR/ROXICODONE) 5 MG immediate release tablet    Sig: Take 1 tablet (5 mg total) by mouth daily as needed for severe pain. Must last 30 days.    Dispense:  30 tablet    Refill:  0    Chronic Pain: STOP Act (Not applicable) Fill 1 day early if closed on refill date. Avoid benzodiazepines within 8 hours of opioids   Orders Placed This Encounter  Procedures   Lumbar Epidural Injection    Standing Status:   Future    Standing Expiration Date:   05/09/2022    Scheduling Instructions:     Procedure: Interlaminar Lumbar Epidural Steroid injection (LESI)            Laterality: RIGHT L4-5     Sedation: without     Timeframe: ASAA    Order Specific Question:   Where will this procedure be performed?    Answer:   [facility]   [employer] Select 13 (MW), Urine    Volume: 30 ml(s). Minimum 3 ml of urine is needed. Document temperature of fresh sample. Indications: Long term (current) use of opiate analgesic [phone])    Order Specific Question:   Release to patient    Answer:   Immediate     Follow-up plan:   Return in about 5 weeks (around 03/14/2022) for Right L4/5 ESI , in clinic [facility].    Recent Visits Date Type Provider Dept  01/03/22 Office Visit Gillis Santa, MD [facility]  12/06/21 Procedure visit Gillis Santa, MD [facility]  11/21/21 Office Visit Gillis Santa, MD [facility]  Showing recent visits within past 90 days and meeting all other requirements Today's Visits Date Type Provider Dept  02/06/22 Office Visit Gillis Santa, MD [facility]  Showing today's visits and meeting all other requirements Future Appointments No visits were found meeting these conditions. Showing future appointments within next 90 days and meeting all other requirements  I discussed the assessment and treatment plan with the patient. The patient was provided an opportunity to ask questions and all were answered. The patient agreed with the plan and demonstrated an understanding of the instructions.  Patient advised to call back or seek an in-person evaluation if the symptoms or condition worsens.  Duration of encounter:25mnutes.  Note by: BGillis Santa MD Date: 02/06/2022; Time: 10:36 AM

## 2022-02-06 NOTE — Patient Instructions (Signed)
Epidural Steroid Injection  An epidural steroid injection is a shot of steroid medicine, also called cortisone, and a numbing medicine that is given into the epidural space. This space is between the spinal cord and the bones of the back. This shot helps relieve pain caused by an irritated or swollen nerve root. The amount of pain relief you get from the injection depends on what is causing the nerve to be swollen and irritated, and how long your pain lasts. You may have a period of slightly more pain after your injection, before the steroid medicine takes effect. This medicine usually starts working within 1-3 days. In some cases, you might need 7-10 days to feel the full effect. Tell your health care provider about: Any allergies you have. All medicines you are taking, including vitamins, herbs, eye drops, creams, and over-the-counter medicines. Any problems you or family members have had with anesthetic medicines. Any bleeding problems you have. Any surgeries you have had. Any medical conditions you have. Whether you are pregnant or may be pregnant. What are the risks? Your health care provider will talk with you about risks. These may include: Headache. Bleeding. Infection. Allergic reaction to medicines or dyes. Nerve damage. Not being able to move (paralysis). This is rare. What happens before the procedure? Medicines You may be given medicines to lower anxiety. Ask your health care provider about: Changing or stopping your regular medicines. These include any diabetes medicines or blood thinners you take. Taking medicines such as aspirin and ibuprofen. These medicines can thin your blood. Do not take them unless your health care provider tells you to. Taking over-the-counter medicines, vitamins, herbs, and supplements. General instructions Follow instructions from your health care provider about what you may eat and drink. Ask your health care provider what steps will be taken to  help prevent infection. If you will be going home right after the procedure, plan to have a responsible adult: Take you home from the hospital or clinic. You will not be allowed to drive. Care for you for the time you are told. What happens during the procedure?  An IV will be inserted into one of your veins. You may be given a sedative. This helps you relax. You will be asked to lie on your side or sit. The injection site will be cleaned. An X-ray machine will be used to guide the needle as close as possible to the nerve causing pain. A needle will be put through your skin into the epidural space. This may cause you some discomfort. Contrast dye may be injected at the site to make sure that the steroid medicine will be sent to the exact place it needs to go. The steroid medicine and a numbing medicine (local anesthesia) will be injected into the epidural space for pain relief. The needle and IV will be removed. A bandage (dressing) will be put over the injection site. The procedure may vary among health care providers and hospitals. What happens after the procedure? Your blood pressure, heart rate, breathing rate, and blood oxygen level will be monitored until you leave the hospital or clinic. Your arm or leg may feel weak or numb for a few hours. Summary An epidural steroid injection is a shot of steroid medicine and a numbing medicine that is given into the epidural space. The shot helps relieve pain caused by an irritated or swollen nerve root. The steroid medicine usually starts working within 1-3 days. In some cases, you might need 7-10 days to feel   the full effect. This information is not intended to replace advice given to you by your health care provider. Make sure you discuss any questions you have with your health care provider. Document Revised: 06/27/2021 Document Reviewed: 06/27/2021 Elsevier Patient Education  2023 [employer]  

## 2022-02-09 LAB — TOXASSURE SELECT 13 (MW), URINE

## 2022-02-20 ENCOUNTER — Encounter: Payer: Medicare Other | Admitting: Student in an Organized Health Care Education/Training Program

## 2022-03-14 ENCOUNTER — Ambulatory Visit
Admission: RE | Admit: 2022-03-14 | Discharge: 2022-03-14 | Disposition: A | Discharge status: Home / Self Care | Payer: Medicare Other | Source: Ambulatory Visit | Attending: Student in an Organized Health Care Education/Training Program | Admitting: Student in an Organized Health Care Education/Training Program

## 2022-03-14 ENCOUNTER — Ambulatory Visit
Payer: Medicare Other | Attending: Student in an Organized Health Care Education/Training Program | Admitting: Student in an Organized Health Care Education/Training Program

## 2022-03-14 ENCOUNTER — Encounter: Payer: Self-pay | Admitting: Student in an Organized Health Care Education/Training Program

## 2022-03-14 VITALS — BP 125/95 | HR 66 | Temp 97.2°F | Resp 14 | Ht 63.0 in | Wt 171.0 lb

## 2022-03-14 DIAGNOSIS — M48061 Spinal stenosis, lumbar region without neurogenic claudication: Secondary | ICD-10-CM | POA: Insufficient documentation

## 2022-03-14 DIAGNOSIS — M5416 Radiculopathy, lumbar region: Secondary | ICD-10-CM

## 2022-03-14 MED ORDER — SODIUM CHLORIDE (PF) 0.9 % IJ SOLN
INTRAMUSCULAR | Status: AC
  Filled 2022-03-14: qty 10

## 2022-03-14 MED ORDER — SODIUM CHLORIDE 0.9% FLUSH
2.0000 mL | Freq: Once | INTRAVENOUS | Status: AC
  Administered 2022-03-14: 2 mL

## 2022-03-14 MED ORDER — IOHEXOL 180 MG/ML  SOLN
10.0000 mL | Freq: Once | INTRAMUSCULAR | Status: AC
  Administered 2022-03-14: 10 mL via EPIDURAL
  Filled 2022-03-14: qty 20

## 2022-03-14 MED ORDER — ROPIVACAINE HCL 2 MG/ML IJ SOLN
2.0000 mL | Freq: Once | INTRAMUSCULAR | Status: AC
  Administered 2022-03-14: 2 mL via EPIDURAL
  Filled 2022-03-14: qty 20

## 2022-03-14 MED ORDER — LIDOCAINE HCL 2 % IJ SOLN
20.0000 mL | Freq: Once | INTRAMUSCULAR | Status: AC
  Administered 2022-03-14: 100 mg
  Filled 2022-03-14: qty 20

## 2022-03-14 MED ORDER — DEXAMETHASONE SODIUM PHOSPHATE 10 MG/ML IJ SOLN
10.0000 mg | Freq: Once | INTRAMUSCULAR | Status: AC
  Administered 2022-03-14: 10 mg
  Filled 2022-03-14: qty 1

## 2022-03-14 NOTE — Patient Instructions (Signed)

## 2022-03-14 NOTE — Progress Notes (Signed)
PROVIDER NOTE: Interpretation of information contained herein should be left to medically-trained personnel. Specific patient instructions are provided elsewhere under "Patient Instructions" section of medical record. This document was created in part using STT-dictation technology, any transcriptional errors that may result from this process are unintentional.  Patient: Leslie Carrillo Type: Established DOB: 07-29-1960 MRN: [id number] PCP: Enid Baas, MD  Service: Procedure DOS: 03/14/2022 Setting: Ambulatory Location: Ambulatory outpatient facility Delivery: Face-to-face Provider: Edward Jolly, MD Specialty: Interventional Pain Management Specialty designation: 09 Location: Outpatient facility Ref. Prov.: Enid Baas, MD    Primary Reason for Visit: Interventional Pain Management Treatment. CC: Back Pain   Procedure:           Type: Lumbar epidural steroid injection (LESI) (interlaminar) #3 (#2 done 10/23/2021) Laterality: Right   Level:  L4-5 Level.  Imaging: Fluoroscopic guidance Anesthesia: Local anesthesia (1-2% Lidocaine) Anxiolysis: None                 Sedation: None. DOS: 03/14/2022  Performed by: Edward Jolly, MD  Purpose: Diagnostic/Therapeutic Indications: Lumbar radicular pain of intraspinal etiology of more than 4 weeks that has failed to respond to conservative therapy and is severe enough to impact quality of life or function. 1. Lumbar radicular pain   2. 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 Lumbar radiculopathy     NAS-11 Pain score:   Pre-procedure: 6 /10   Post-procedure: 4 /10      Position / Prep / Materials:  Position: Prone w/ head of the table raised (slight reverse trendelenburg) to facilitate breathing.  Prep solution: DuraPrep (Iodine Povacrylex [0.7% available iodine] and Isopropyl Alcohol, 74% w/w) Prep Area: Entire Posterior Lumbar Region from lower scapular tip down to mid buttocks area and from flank to  flank. Materials:  Tray: Epidural tray Needle(s):  Type: Epidural needle (Tuohy) Gauge (G):  22 Length: Regular (3.5-in) Qty: 1  Pre-op H&P Assessment:  Leslie Carrillo is a 61 y.o. (year old), female patient, seen today for interventional treatment. She  has a past surgical history that includes Cervical fusion (2014); carpel tunnel (Right); Cholecystectomy; c sections (N/A, 1985); Colonoscopy; and Shoulder arthroscopy with subacromial decompression, rotator cuff repair and bicep tendon repair (Right, 11/08/2020). Leslie Carrillo has a current medication list which includes the following prescription(s): acetaminophen, buprenorphine, cholecalciferol, clobetasol, clonidine, oxycodone, and sertraline. Her primarily concern today is the Back Pain  Initial Vital Signs:  Pulse/HCG Rate: 66 ECG Heart Rate: 64 Temp: (!) 97.2 F (36.2 C) Resp: 18 BP: 131/79 SpO2: 100 %  BMI: Estimated body mass index is 30.29 kg/m as calculated from the following:   Height as of this encounter: 5\' 3"  (1.6 m).   Weight as of this encounter: 171 lb (77.6 kg).  Risk Assessment: Allergies: Reviewed. She is allergic to codeine, pregabalin, hydrocodone-acetaminophen, tramadol, latex, lovastatin, and simvastatin.  Allergy Precautions: None required Coagulopathies: Reviewed. None identified.  Blood-thinner therapy: None at this time Active Infection(s): Reviewed. None identified. [name] is afebrile  Site Confirmation: Leslie Carrillo was asked to confirm the procedure and laterality before marking the site Procedure checklist: Completed Consent: Before the procedure and under the influence of no sedative(s), amnesic(s), or anxiolytics, the patient was informed of the treatment options, risks and possible complications. To fulfill our ethical and legal obligations, as recommended by the American Medical Association's Code of Ethics, I have informed the patient of my clinical impression; the nature and purpose of the treatment or  procedure; the risks, benefits, and possible  complications of the intervention; the alternatives, including doing nothing; the risk(s) and benefit(s) of the alternative treatment(s) or procedure(s); and the risk(s) and benefit(s) of doing nothing. The patient was provided information about the general risks and possible complications associated with the procedure. These may include, but are not limited to: failure to achieve desired goals, infection, bleeding, organ or nerve damage, allergic reactions, paralysis, and death. In addition, the patient was informed of those risks and complications associated to Spine-related procedures, such as failure to decrease pain; infection (i.e.: Meningitis, epidural or intraspinal abscess); bleeding (i.e.: epidural hematoma, subarachnoid hemorrhage, or any other type of intraspinal or peri-dural bleeding); organ or nerve damage (i.e.: Any type of peripheral nerve, nerve root, or spinal cord injury) with subsequent damage to sensory, motor, and/or autonomic systems, resulting in permanent pain, numbness, and/or weakness of one or several areas of the body; allergic reactions; (i.e.: anaphylactic reaction); and/or death. Furthermore, the patient was informed of those risks and complications associated with the medications. These include, but are not limited to: allergic reactions (i.e.: anaphylactic or anaphylactoid reaction(s)); adrenal axis suppression; blood sugar elevation that in diabetics may result in ketoacidosis or comma; water retention that in patients with history of congestive heart failure may result in shortness of breath, pulmonary edema, and decompensation with resultant heart failure; weight gain; swelling or edema; medication-induced neural toxicity; particulate matter embolism and blood vessel occlusion with resultant organ, and/or nervous system infarction; and/or aseptic necrosis of one or more joints. Finally, the patient was informed that Medicine is  not an exact science; therefore, there is also the possibility of unforeseen or unpredictable risks and/or possible complications that may result in a catastrophic outcome. The patient indicated having understood very clearly. We have given the patient no guarantees and we have made no promises. Enough time was given to the patient to ask questions, all of which were answered to the patient's satisfaction. Ms. Dockett has indicated that she wanted to continue with the procedure. Attestation: I, the ordering provider, attest that I have discussed with the patient the benefits, risks, side-effects, alternatives, likelihood of achieving goals, and potential problems during recovery for the procedure that I have provided informed consent. Date  Time: 03/14/2022 10:41 AM  Pre-Procedure Preparation:  Monitoring: As per clinic protocol. Respiration, ETCO2, SpO2, BP, heart rate and rhythm monitor placed and checked for adequate function Safety Precautions: Patient was assessed for positional comfort and pressure points before starting the procedure. Time-out: I initiated and conducted the "Time-out" before starting the procedure, as per protocol. The patient was asked to participate by confirming the accuracy of the "Time Out" information. Verification of the correct person, site, and procedure were performed and confirmed by me, the nursing staff, and the patient. "Time-out" conducted as per Joint Commission's Universal Protocol (UP.01.01.01). Time: 1115  Description/Narrative of Procedure:          Target: Epidural space via interlaminar opening, initially targeting the lower laminar border of the superior vertebral body. Region: Lumbar Approach: Percutaneous paravertebral  Rationale (medical necessity): procedure needed and proper for the diagnosis and/or treatment of the patient's medical symptoms and needs. Procedural Technique Safety Precautions: Aspiration looking for blood return was conducted prior  to all injections. At no point did we inject any substances, as a needle was being advanced. No attempts were made at seeking any paresthesias. Safe injection practices and needle disposal techniques used. Medications properly checked for expiration dates. SDV (single dose vial) medications used. Description of the Procedure: Protocol guidelines  were followed. The procedure needle was introduced through the skin, ipsilateral to the reported pain, and advanced to the target area. Bone was contacted and the needle walked caudad, until the lamina was cleared. The epidural space was identified using "loss-of-resistance technique" with 2-3 ml of PF-NaCl (0.9% NSS), in a 5cc LOR glass syringe.   6 cc solution made of 3 cc of preservative-free saline, 2 cc of 0.2% ropivacaine, 1 cc of Decadron 10 mg/cc.   Vitals:   03/14/22 1107 03/14/22 1113 03/14/22 1119 03/14/22 1123  BP: 111/85 111/71 123/78 (!) 125/95  Pulse:      Resp: (!) 22 12 12 14   Temp:      SpO2: 97% 97% 97% 96%  Weight:      Height:        Start Time: 1115 hrs. End Time: 1122 hrs.  Imaging Guidance (Spinal):          Type of Imaging Technique: Fluoroscopy Guidance (Spinal) Indication(s): Assistance in needle guidance and placement for procedures requiring needle placement in or near specific anatomical locations not easily accessible without such assistance. Exposure Time: Please see nurses notes. Contrast: Before injecting any contrast, we confirmed that the patient did not have an allergy to iodine, shellfish, or radiological contrast. Once satisfactory needle placement was completed at the desired level, radiological contrast was injected. Contrast injected under live fluoroscopy. No contrast complications. See chart for type and volume of contrast used. Fluoroscopic Guidance: I was personally present during the use of fluoroscopy. "Tunnel Vision Technique" used to obtain the best possible view of the target area. Parallax error  corrected before commencing the procedure. "Direction-depth-direction" technique used to introduce the needle under continuous pulsed fluoroscopy. Once target was reached, antero-posterior, oblique, and lateral fluoroscopic projection used confirm needle placement in all planes. Images permanently stored in EMR. Interpretation: I personally interpreted the imaging intraoperatively. Adequate needle placement confirmed in multiple planes. Appropriate spread of contrast into desired area was observed. No evidence of afferent or efferent intravascular uptake. No intrathecal or subarachnoid spread observed. Permanent images saved into the patient's record.  Antibiotic Prophylaxis:   Anti-infectives (From admission, onward)    None      Indication(s): None identified  Post-operative Assessment:  Post-procedure Vital Signs:  Pulse/HCG Rate: 66 (!) 56 Temp: (!) 97.2 F (36.2 C) Resp: 14 BP:  (!) 125/95 SpO2: 96 %  EBL: None  Complications: No immediate post-treatment complications observed by team, or reported by patient.  Note: The patient tolerated the entire procedure well. A repeat set of vitals were taken after the procedure and the patient was kept under observation following institutional policy, for this type of procedure. Post-procedural neurological assessment was performed, showing return to baseline, prior to discharge. The patient was provided with post-procedure discharge instructions, including a section on how to identify potential problems. Should any problems arise concerning this procedure, the patient was given instructions to immediately contact , at any time, without hesitation. In any case, we plan to contact the patient by telephone for a follow-up status report regarding this interventional procedure.  Comments:  No additional relevant information.  Plan of Care  Orders:  Orders Placed This Encounter  Procedures   DG PAIN CLIN[facility] C-ARM 1-60 MIN NO REPORT     Intraoperative interpretation by procedural physician at [facility].    Standing Status:   Standing    Number of Occurrences:   1    Order Specific Question:   Reason for exam:  Answer:   Assistance in needle guidance and placement for procedures requiring needle placement in or near specific anatomical locations not easily accessible without such assistance.   Chronic Opioid Analgesic:  Buprenorphine transdermal patch, 15 mcg an hour, oxycodone 5 mg daily as needed for breakthrough pain, quantity #30 to last for 90 days MME/day: 10 mg/day   Medications ordered for procedure: Meds ordered this encounter  Medications   iohexol (OMNIPAQUE) 180 MG/ML injection 10 mL    Must be Myelogram-compatible. If not available, you may substitute with a water-soluble, non-ionic, hypoallergenic, myelogram-compatible radiological contrast medium.   lidocaine (XYLOCAINE) 2 % (with pres) injection 400 mg   ropivacaine (PF) 2 mg/mL (0.2%) (NAROPIN) injection 2 mL   sodium chloride flush (NS) 0.9 % injection 2 mL   dexamethasone (DECADRON) injection 10 mg   Medications administered: We administered iohexol, lidocaine, ropivacaine (PF) 2 mg/mL (0.2%), sodium chloride flush, and dexamethasone.  See the medical record for exact dosing, route, and time of administration.  Follow-up plan:   Return in 4 weeks (on 04/11/2022) for Post Procedure Evaluation and discuss MBNB #2, virtual.      Recent Visits Date Type Provider Dept  02/06/22 Office Visit Gillis Santa, MD [facility]  01/03/22 Office Visit Gillis Santa, MD [facility]  Showing recent visits within past 90 days and meeting all other requirements Today's Visits Date Type Provider Dept  03/14/22 Procedure visit Gillis Santa, MD [facility]  Showing today's visits and meeting all other requirements Future Appointments Date Type Provider Dept  04/11/22 Appointment Gillis Santa, MD [facility]   05/03/22 Appointment Gillis Santa, MD [facility]  Showing future appointments within next 90 days and meeting all other requirements  Disposition: Discharge home  Discharge (Date  Time): 03/14/2022; 1129 hrs.   Primary Care Physician: Gladstone Lighter, MD Location: [facility] Note by: Gillis Santa, MD Date: 03/14/2022; Time: 11:35 AM  Disclaimer:  Medicine is not an exact science. The only guarantee in medicine is that nothing is guaranteed. It is important to note that the decision to proceed with this intervention was based on the information collected from the patient. The Data and conclusions were drawn from the patient's questionnaire, the interview, and the physical examination. Because the information was provided in large part by the patient, it cannot be guaranteed that it has not been purposely or unconsciously manipulated. Every effort has been made to obtain as much relevant data as possible for this evaluation. It is important to note that the conclusions that lead to this procedure are derived in large part from the available data. Always take into account that the treatment will also be dependent on availability of resources and existing treatment guidelines, considered by other Pain Management Practitioners as being common knowledge and practice, at the time of the intervention. For Medico-Legal purposes, it is also important to point out that variation in procedural techniques and pharmacological choices are the acceptable norm. The indications, contraindications, technique, and results of the above procedure should only be interpreted and judged by a Board-Certified Interventional Pain Specialist with extensive familiarity and expertise in the same exact procedure and technique.

## 2022-03-14 NOTE — Progress Notes (Signed)
Safety precautions to be maintained throughout the outpatient stay will include: orient to surroundings, keep bed in low position, maintain call bell within reach at all times, provide assistance with transfer out of bed and ambulation.  

## 2022-03-15 ENCOUNTER — Telehealth: Payer: Self-pay | Admitting: *Deleted

## 2022-03-15 NOTE — Telephone Encounter (Signed)
Called for post procedure check. Denies any issues. 

## 2022-04-11 ENCOUNTER — Ambulatory Visit
Payer: Medicare Other | Attending: Student in an Organized Health Care Education/Training Program | Admitting: Student in an Organized Health Care Education/Training Program

## 2022-04-11 DIAGNOSIS — G894 Chronic pain syndrome: Secondary | ICD-10-CM

## 2022-04-11 DIAGNOSIS — M5416 Radiculopathy, lumbar region: Secondary | ICD-10-CM

## 2022-04-11 DIAGNOSIS — M48061 Spinal stenosis, lumbar region without neurogenic claudication: Secondary | ICD-10-CM | POA: Diagnosis not present

## 2022-04-11 NOTE — Progress Notes (Signed)
Patient: Leslie Carrillo  Service Category: E/M  Provider: Edward Jolly, MD  DOB: September 09, 1960  DOS: 04/11/2022  Location: Office  MRN: [id number]  Setting: Ambulatory outpatient  Referring Provider: Enid Baas, MD  Type: Established Patient  Specialty: Interventional Pain Management  PCP: Enid Baas, MD  Location: Remote location  Delivery: TeleHealth     Virtual Encounter - Pain Management PROVIDER NOTE: Information contained herein reflects review and annotations entered in association with encounter. Interpretation of such information and data should be left to medically-trained personnel. Information provided to patient can be located elsewhere in the medical record under "Patient Instructions". Document created using STT-dictation technology, any transcriptional errors that may result from process are unintentional.    Contact & Pharmacy Preferred: [phone] Home: [phone] (home) Mobile: [phone] (mobile) E-mail: [email or web address]  [id number] Nich[place] Rough, [place] - [address] AT [facility] [address] [place] [address] Phone: [phone] Fax: [phone]   Pre-screening  Leslie Carrillo offered "in-person" vs "virtual" encounter. She indicated preferring virtual for this encounter.   Reason COVID-19*  Social distancing based on CDC and AMA recommendations.   I contacted Leslie Carrillo on 04/11/2022 via telephone.      I clearly identified myself as Edward Jolly, MD. I verified that I was speaking with the correct person using two identifiers (Name: Leslie Carrillo, and date of birth: 11-23-1960).  Consent I sought verbal advanced consent from Leslie Carrillo for virtual visit interactions. I informed Leslie Carrillo of possible security and privacy concerns, risks, and limitations associated with providing "not-in-person" medical evaluation and management services. I also informed Leslie Carrillo of the availability of  "in-person" appointments. Finally, I informed her that there would be a charge for the virtual visit and that she could be  personally, fully or partially, financially responsible for it. Leslie Carrillo expressed understanding and agreed to proceed.   Historic Elements   Leslie Carrillo is a 62 y.o. year old, female patient evaluated today after our last contact on 03/14/2022. Leslie Carrillo  has a past medical history of Anemia, Arthritis, Asthma, Chronic kidney disease, Chronic pain, and History of kidney stones. She also  has a past surgical history that includes Cervical fusion (2014); carpel tunnel (Right); Cholecystectomy; c sections (N/A, 1985); Colonoscopy; and Shoulder arthroscopy with subacromial decompression, rotator cuff repair and bicep tendon repair (Right, 11/08/2020). Leslie Carrillo has a current medication list which includes the following prescription(s): acetaminophen, buprenorphine, clobetasol, hydroxyzine, multivitamin, sertraline, cholecalciferol, and clonidine. She  reports that she has been smoking cigarettes. She has a 10.00 pack-year smoking history. She has never used smokeless tobacco. She reports that she does not drink alcohol and does not use drugs. Leslie Carrillo is allergic to codeine, pregabalin, hydrocodone-acetaminophen, tramadol, latex, lovastatin, and simvastatin.  Estimated body mass index is 30.29 kg/m as calculated from the following:   Height as of 03/14/22: 5\' 3"  (1.6 m).   Weight as of 03/14/22: 171 lb (77.6 kg).  HPI  Today, she is being contacted for a post-procedure assessment.   Post-procedure evaluation   Type: Lumbar epidural steroid injection (LESI) (interlaminar) #3 (#2 done 10/23/2021) Laterality: Right   Level:  L4-5 Level.  Imaging: Fluoroscopic guidance Anesthesia: Local anesthesia (1-2% Lidocaine) Anxiolysis: None                 Sedation: None. DOS: 03/14/2022  Performed by: 03/16/2022, MD  Purpose: Diagnostic/Therapeutic Indications: Lumbar radicular  pain  of intraspinal etiology of more than 4 weeks that has failed to respond to conservative therapy and is severe enough to impact quality of life or function. 1. Lumbar radicular pain   2. 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 Lumbar radiculopathy     NAS-11 Pain score:   Pre-procedure: 6 /10   Post-procedure: 4 /10      Effectiveness:  Initial hour after procedure: 0 %  Subsequent 4-6 hours post-procedure: 0 %  Analgesia past initial 6 hours: 10 % (patient reports slight pain relief from epidural.  does not feel that it did as well as previously)  Ongoing improvement:  Analgesic:  <10% Function: No benefit ROM: No benefit   Pharmacotherapy Assessment   Opioid Analgesic: Buprenorphine transdermal patch, 15 mcg an hour, oxycodone 5 mg daily as needed for breakthrough pain, quantity #30 to last for 90 days MME/day: 10 mg/day   Monitoring: [place] PMP: PDMP not reviewed this encounter.       Pharmacotherapy: No side-effects or adverse reactions reported. Compliance: No problems identified. Effectiveness: Clinically acceptable. Plan: Refer to "POC". UDS:  Summary  Date Value Ref Range Status  02/06/2022 Note  Final    Comment:    ==================================================================== [employer] Select 13 (MW) ==================================================================== Test                             Result       Flag       Units  Drug Present and Declared for Prescription Verification   Oxycodone                      279          EXPECTED   ng/mg creat   Oxymorphone                    167          EXPECTED   ng/mg creat   Noroxycodone                   650          EXPECTED   ng/mg creat   Noroxymorphone                 64           EXPECTED   ng/mg creat    Sources of oxycodone are scheduled prescription medications.    Oxymorphone, noroxycodone, and noroxymorphone are expected    metabolites of oxycodone. Oxymorphone is also  available as a    scheduled prescription medication.    Buprenorphine                  30           EXPECTED   ng/mg creat   Norbuprenorphine               39           EXPECTED   ng/mg creat    Source of buprenorphine is a scheduled prescription medication.    Norbuprenorphine is an expected metabolite of buprenorphine.  ==================================================================== Test                      Result    Flag   Units      Ref Range   Creatinine  107              mg/dL      >=20 ==================================================================== Declared Medications:  The flagging and interpretation on this report are based on the  following declared medications.  Unexpected results may arise from  inaccuracies in the declared medications.   **Note: The testing scope of this panel includes these medications:   Oxycodone (Roxicodone)   **Note: The testing scope of this panel does not include small to  moderate amounts of these reported medications:   Buprenorphine Patch (BuTrans)   **Note: The testing scope of this panel does not include the  following reported medications:   Acetaminophen (Tylenol)  Cholecalciferol  Clobetasol  Clonidine (Catapres)  Sertraline (Zoloft) ==================================================================== For clinical consultation, please call [phone]. ====================================================================    No results found for: "CBDTHCR", "D8THCCBX", "D9THCCBX"   Laboratory Chemistry Profile   Renal Lab Results  Component Value Date   BUN 11 11/08/2020   CREATININE 0.97 11/08/2020   GFRAA >60 10/16/2019   GFRNONAA >60 11/08/2020    Hepatic Lab Results  Component Value Date   AST 20 10/16/2019   ALT 15 10/16/2019   ALBUMIN 4.0 10/16/2019   ALKPHOS 75 10/16/2019   LIPASE 29 10/16/2019    Electrolytes Lab Results  Component Value Date   NA 137 11/08/2020   K 3.9  11/08/2020   CL 102 11/08/2020   CALCIUM 9.0 11/08/2020    Bone No results found for: "VD25OH", "VD125OH2TOT", "GY6599JT7", "SV7793JQ3", "25OHVITD1", "25OHVITD2", "25OHVITD3", "TESTOFREE", "TESTOSTERONE"  Inflammation (CRP: Acute Phase) (ESR: Chronic Phase) No results found for: "CRP", "ESRSEDRATE", "LATICACIDVEN"       Note: Above Lab results reviewed.   Assessment  The primary encounter diagnosis was Lumbar radicular pain. 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  Plan of Care  Keep follow up as scheduled    Follow-up plan:   No follow-ups on file.     Has tried cervical and thoracic epidural steroid injections with physiatry at [facility].  Consider thoracic TPI.  Consider lumbar facet medial branch nerve blocks for severe facet arthropathy lower lumbar spine present on x-ray.  Could also consider knee intra-articular steroid, Hyalgan, genicular nerve block. S/p right shoulder injection 12/23/19, 02/03/20 right shoulder MRI-supraspinatus tendon tear with infraspinatus tendinopathy, has been told that she needs to have right shoulder surgery, tentative plan early August.     Recent Visits Date Type Provider Dept  03/14/22 Procedure visit Gillis Santa, MD [facility]  02/06/22 Office Visit Gillis Santa, MD [facility]  Showing recent visits within past 90 days and meeting all other requirements Future Appointments Date Type Provider Dept  05/03/22 Appointment Gillis Santa, MD [facility]  Showing future appointments within next 90 days and meeting all other requirements  I discussed the assessment and treatment plan with the patient. The patient was provided an opportunity to ask questions and all were answered. The patient agreed with the plan and demonstrated an understanding of the instructions.  Patient advised to call back or seek an in-person  evaluation if the symptoms or condition worsens.  Duration of encounter: 64minutes.  Note by: Gillis Santa, MD Date: 04/11/2022; Time: 3:23 PM

## 2022-05-03 ENCOUNTER — Encounter: Payer: Self-pay | Admitting: Student in an Organized Health Care Education/Training Program

## 2022-05-03 ENCOUNTER — Ambulatory Visit
Payer: Medicare Other | Attending: Student in an Organized Health Care Education/Training Program | Admitting: Student in an Organized Health Care Education/Training Program

## 2022-05-03 VITALS — BP 134/72 | HR 67 | Temp 97.1°F | Ht 63.0 in | Wt 180.0 lb

## 2022-05-03 DIAGNOSIS — M48061 Spinal stenosis, lumbar region without neurogenic claudication: Secondary | ICD-10-CM | POA: Diagnosis not present

## 2022-05-03 DIAGNOSIS — G894 Chronic pain syndrome: Secondary | ICD-10-CM | POA: Insufficient documentation

## 2022-05-03 DIAGNOSIS — M5416 Radiculopathy, lumbar region: Secondary | ICD-10-CM | POA: Diagnosis not present

## 2022-05-03 MED ORDER — BUPRENORPHINE 15 MCG/HR TD PTWK: 1.0000 | MEDICATED_PATCH | TRANSDERMAL | 2 refills | Status: DC

## 2022-05-03 MED ORDER — OXYCODONE HCL 5 MG PO TABS: 5.0000 mg | ORAL_TABLET | Freq: Every day | ORAL | 0 refills | Status: AC | PRN

## 2022-05-03 NOTE — Progress Notes (Signed)
PROVIDER NOTE: Information contained herein reflects review and annotations entered in association with encounter. Interpretation of such information and data should be left to medically-trained personnel. Information provided to patient can be located elsewhere in the medical record under "Patient Instructions". Document created using STT-dictation technology, any transcriptional errors that may result from process are unintentional.    Patient: Leslie Carrillo  Service Category: E/M  Provider: Gillis Santa, MD  DOB: [birth year]  DOS: 05/03/2022  Specialty: Interventional Pain Management  MRN: [id number]  Setting: Ambulatory outpatient  PCP: Gladstone Lighter, MD  Type: Established Patient    Referring Provider: Gladstone Lighter, MD  Location: Office  Delivery: Face-to-face     HPI  Ms. NASHANA SEIDLER, a 62 y.o. year old female, is here today because of her Lumbar radicular pain [M54.16]. Ms. Salizar primary complain today is Back Pain (lower)   Last encounter: My last encounter with her was on 03/14/22  Pertinent problems: Ms. Stinchfield has Chronic low back pain; DDD (degenerative disc disease), thoracic; Lumbar radiculopathy; Cervical facet joint syndrome; Bilateral primary osteoarthritis of knee; Lumbar degenerative disc disease; Chronic pain syndrome; Myofascial pain syndrome; and Lumbar facet arthropathy on their pertinent problem list. Pain Assessment: Severity of Chronic pain is reported as a 4 /10. Location: Back Lower, Right, Left/radiates down legs. Onset: More than a month ago. Quality: Aching, Constant. Timing: Constant. Modifying factor(s): patches, meds. Vitals:  height is 5' 3"$  (1.6 m) and weight is 180 lb (81.6 kg). Her temporal temperature is 97.1 F (36.2 C) (abnormal). Her blood pressure is 134/72 and her pulse is 67. Her oxygen saturation is 100%.   Reason for encounter: both, medication management and post-procedure assessment.  Increased right lumbar radicular pain, and more  difficulty walking. Recommend L-MRI for further diagnostic work up, lumbar spine xray below Refill of chronic medications for pain management, patient taking appropriately and as prescribed Patient continues home stretching exercises that she has learned from PT in the past  Post-procedure evaluation   Type: Lumbar epidural steroid injection (LESI) (interlaminar) #3 (#2 done 10/23/2021) Laterality: Right   Level:  L4-5 Level.  Imaging: Fluoroscopic guidance Anesthesia: Local anesthesia (1-2% Lidocaine) Anxiolysis: None                 Sedation: None. DOS: 03/14/2022  Performed by: Gillis Santa, MD  Purpose: Diagnostic/Therapeutic Indications: Lumbar radicular pain of intraspinal etiology of more than 4 weeks that has failed to respond to conservative therapy and is severe enough to impact quality of life or function. 1. Lumbar radicular pain   2. 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 Lumbar radiculopathy     NAS-11 Pain score:   Pre-procedure: 6 /10   Post-procedure: 4 /10       Effectiveness:  Initial hour after procedure: 50% Subsequent 4-6 hours post-procedure:50% Analgesia past initial 6 hours: <25% Ongoing improvement:  Analgesic:  25% Function: Somewhat improved     Pharmacotherapy Assessment  Analgesic: Buprenorphine transdermal patch, 15 mcg an hour, oxycodone 5 mg daily as needed for breakthrough pain, quantity #30 to last for 90 days MME/day: 10 mg/day   Monitoring: [place] PMP: PDMP reviewed during this encounter.       Pharmacotherapy: No side-effects or adverse reactions reported. Compliance: No problems identified. Effectiveness: Clinically acceptable.  UDS:  Summary  Date Value Ref Range Status  02/06/2022 Note  Final    Comment:    ==================================================================== [employer] [facility] (MW) ==================================================================== Test  Result        Flag       Units  Drug Present and Declared for Prescription Verification   Oxycodone                      279          EXPECTED   ng/mg creat   Oxymorphone                    167          EXPECTED   ng/mg creat   Noroxycodone                   650          EXPECTED   ng/mg creat   Noroxymorphone                 64           EXPECTED   ng/mg creat    Sources of oxycodone are scheduled prescription medications.    Oxymorphone, noroxycodone, and noroxymorphone are expected    metabolites of oxycodone. Oxymorphone is also available as a    scheduled prescription medication.    Buprenorphine                  30           EXPECTED   ng/mg creat   Norbuprenorphine               39           EXPECTED   ng/mg creat    Source of buprenorphine is a scheduled prescription medication.    Norbuprenorphine is an expected metabolite of buprenorphine.  ==================================================================== Test                      Result    Flag   Units      Ref Range   Creatinine              107              mg/dL      >=20 ==================================================================== Declared Medications:  The flagging and interpretation on this report are based on the  following declared medications.  Unexpected results may arise from  inaccuracies in the declared medications.   **Note: The testing scope of this panel includes these medications:   Oxycodone (Roxicodone)   **Note: The testing scope of this panel does not include small to  moderate amounts of these reported medications:   Buprenorphine Patch (BuTrans)   **Note: The testing scope of this panel does not include the  following reported medications:   Acetaminophen (Tylenol)  Cholecalciferol  Clobetasol  Clonidine (Catapres)  Sertraline (Zoloft) ==================================================================== For clinical consultation, please call [phone]. ====================================================================       ROS  Constitutional: Denies any fever or chills Gastrointestinal: No reported hemesis, hematochezia, vomiting, or acute GI distress Musculoskeletal: R> L hip, leg pain Neurological: No reported episodes of acute onset apraxia, aphasia, dysarthria, agnosia, amnesia, paralysis, loss of coordination, or loss of consciousness  Medication Review  acetaminophen, buprenorphine, clobetasol, hydrOXYzine, multivitamin, oxyCODONE, and sertraline  History Review  Allergy: Ms. Keltz is allergic to codeine, pregabalin, hydrocodone-acetaminophen, tramadol, latex, lovastatin, and simvastatin. Drug: Ms. Frohn  reports no history of drug use. Alcohol:  reports no history of alcohol use. Tobacco:  reports that she has been smoking cigarettes. She has a 10.00  pack-year smoking history. She has never used smokeless tobacco. Social: Ms. Titus  reports that she has been smoking cigarettes. She has a 10.00 pack-year smoking history. She has never used smokeless tobacco. She reports that she does not drink alcohol and does not use drugs. Medical:  has a past medical history of Anemia, Arthritis, Asthma, Chronic kidney disease, Chronic pain, and History of kidney stones. Surgical: Ms. Meese  has a past surgical history that includes Cervical fusion (2014); carpel tunnel (Right); Cholecystectomy; c sections (N/A, 1985); Colonoscopy; and Shoulder arthroscopy with subacromial decompression, rotator cuff repair and bicep tendon repair (Right, 11/08/2020). Family: family history includes Dementia in her mother; Prostate cancer in her father; Renal Cyst in her father.  Laboratory Chemistry Profile   Renal Lab Results  Component Value Date   BUN 11 11/08/2020   CREATININE 0.97 11/08/2020   GFRAA >60 10/16/2019   GFRNONAA >60 11/08/2020     Hepatic Lab Results  Component Value Date   AST 20 10/16/2019   ALT 15 10/16/2019    ALBUMIN 4.0 10/16/2019   ALKPHOS 75 10/16/2019   LIPASE 29 10/16/2019     Electrolytes Lab Results  Component Value Date   NA 137 11/08/2020   K 3.9 11/08/2020   CL 102 11/08/2020   CALCIUM 9.0 11/08/2020     Bone No results found for: "VD25OH", "VD125OH2TOT", "[id number]", "UK:060616", "25OHVITD1", "25OHVITD2", "25OHVITD3", "TESTOFREE", "TESTOSTERONE"   Inflammation (CRP: Acute Phase) (ESR: Chronic Phase) No results found for: "CRP", "ESRSEDRATE", "LATICACIDVEN"     Note: Above Lab results reviewed.  Recent Imaging Review  DG PAIN CLINIC C-ARM 1-60 MIN NO REPORT Fluoro was used, but no Radiologist interpretation will be provided.  Please refer to "NOTES" tab for provider progress note.  CLINICAL DATA:  Low back pain extending into the hips bilaterally.   EXAM: RIGHT KNEE - 1-2 VIEW; LEFT KNEE - 1-2 VIEW; DG HIP (WITH OR WITHOUT PELVIS) 2-3V LEFT; LUMBAR SPINE - COMPLETE WITH BENDING VIEWS; DG HIP (WITH OR WITHOUT PELVIS) 2-3V RIGHT   COMPARISON:  MRI lumbar spine 01/09/2012   FINDINGS: Non rib-bearing lumbar type vertebral bodies are present. Progressive levoconvex curvature of the lumbar spine is centered at L2-3. Compensatory rightward curvature is present at L5. Slight retrolisthesis is present at L2-3 and L3-4. There is slight anterolisthesis at L4-5. Marked facet hypertrophy is present from L3-4 through L5-S1.   Atherosclerotic calcifications are present in the aorta.   There is no significant change in listhesis associated with flexion or extension.   IMPRESSION: 1. 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 2. Retrolisthesis at L3-4 and anterolisthesis at L4-5 is new. 3. Significant facet hypertrophy at L3-4 and L4-5 in particular may contribute to radicular symptoms.     Electronically Signed   By: San Morelle M.D.   On: 04/01/2019 06:02  Note: Reviewed        Physical Exam  General appearance: Well nourished, well developed,  and well hydrated. In no apparent acute distress Mental status: Alert, oriented x 3 (person, place, & time)       Respiratory: No evidence of acute respiratory distress Eyes: PERLA Vitals: BP 134/72   Pulse 67   Temp (!) 97.1 F (36.2 C) (Temporal)   Ht 5' 3"$  (1.6 m)   Wt 180 lb (81.6 kg)   SpO2 100%   BMI 31.89 kg/m  BMI: Estimated body mass index is 31.89 kg/m as calculated from the following:   Height as of this encounter: 5' 3"$  (  1.6 m).   Weight as of this encounter: 180 lb (81.6 kg). Ideal: Ideal body weight: 52.4 kg (115 lb 8.3 oz) Adjusted ideal body weight: 64.1 kg (141 lb 5 oz)  Lumbar Spine Area Exam  Skin & Axial Inspection: No masses, redness, or swelling Alignment: Symmetrical Functional ROM: Pain restricted ROM affecting both sides right greater than left Stability: No instability detected Muscle Tone/Strength: Functionally intact. No obvious neuro-muscular anomalies detected. Sensory (Neurological): Dermatomal pain pattern right greater than left  Gait & Posture Assessment  Ambulation: Unassisted Gait: Relatively normal for age and body habitus Posture: WNL  Lower Extremity Exam    Side: Right lower extremity  Side: Left lower extremity  Stability: No instability observed          Stability: No instability observed          Skin & Extremity Inspection: Skin color, temperature, and hair growth are WNL. No peripheral edema or cyanosis. No masses, redness, swelling, asymmetry, or associated skin lesions. No contractures.  Skin & Extremity Inspection: Skin color, temperature, and hair growth are WNL. No peripheral edema or cyanosis. No masses, redness, swelling, asymmetry, or associated skin lesions. No contractures.  Functional ROM: Pain restricted ROM for hip and knee joints          Functional ROM: Unrestricted ROM                  Muscle Tone/Strength: Functionally intact. No obvious neuro-muscular anomalies detected.  Muscle Tone/Strength: Functionally intact.  No obvious neuro-muscular anomalies detected.  Sensory (Neurological): Dermatomal pain pattern        Sensory (Neurological): Unimpaired        DTR: Patellar: deferred today Achilles: deferred today Plantar: deferred today  DTR: Patellar: deferred today Achilles: deferred today Plantar: deferred today  Palpation: No palpable anomalies  Palpation: No palpable anomalies     Assessment   Status Diagnosis  Persistent Persistent Persistent 1. Lumbar radicular pain   2. 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 Lumbar radiculopathy   4. Chronic pain syndrome          Plan of Care   Ms. STONE VILLIARD has a current medication list which includes the following long-term medication(s): sertraline.  1. Lumbar radicular pain - MR LUMBAR SPINE WO CONTRAST; Future - buprenorphine (BUTRANS) 15 MCG/HR; Place 1 patch onto the skin once a week. For chronic pain syndrome  Dispense: 4 patch; Refill: 2  2. 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 For chronic pain syndrome  Dispense: 4 patch; Refill: 2  3. Lumbar radiculopathy - MR LUMBAR SPINE WO CONTRAST; Future - buprenorphine (BUTRANS) 15 MCG/HR; Place 1 patch onto the skin once a week. For chronic pain syndrome  Dispense: 4 patch; Refill: 2  4. Chronic pain syndrome - buprenorphine (BUTRANS) 15 MCG/HR; Place 1 patch onto the skin once a week. For chronic pain syndrome  Dispense: 4 patch; Refill: 2     Pharmacotherapy (Medications Ordered): Meds ordered this encounter  Medications   buprenorphine (BUTRANS) 15 MCG/HR    Sig: Place 1 patch onto the skin once a week. For chronic pain syndrome    Dispense:  4 patch    Refill:  2   oxyCODONE (OXY IR/ROXICODONE) 5 MG immediate release tablet    Sig: Take 1 tablet (5 mg total) by mouth daily as needed for  severe pain or breakthrough pain.  Must last 30 days.    Dispense:  30 tablet    Refill:  0    Chronic Pain: STOP Act (Not applicable) Fill 1 day early if closed on refill date. Avoid benzodiazepines within 8 hours of opioids   Orders Placed This Encounter  Procedures   MR LUMBAR SPINE [place]    Patient presents with axial pain with possible radicular component. Please assist [place] in identifying specific level(s) and laterality of any additional findings such as: 1. Facet (Zygapophyseal) joint DJD (Hypertrophy, space narrowing, subchondral sclerosis, and/or osteophyte formation) 2. DDD and/or IVDD (Loss of disc height, desiccation, gas patterns, osteophytes, endplate sclerosis, or "Black disc disease") 3. Pars defects 4. Spondylolisthesis, spondylosis, and/or spondyloarthropathies (include Degree/Grade of displacement in mm) (stability) 5. Vertebral body Fractures (acute/chronic) (state percentage of collapse) 6. Demineralization (osteopenia/osteoporotic) 7. Bone pathology 8. Foraminal narrowing  9. Surgical changes 10. Central, Lateral Recess, and/or Foraminal Stenosis (include AP diameter of stenosis in mm) 11. Surgical changes (hardware type, status, and presence of fibrosis) 12. Modic Type Changes (MRI only) 13. IVDD (Disc bulge, protrusion, herniation, extrusion) (Level, laterality, extent)    Standing Status:   Future    Standing Expiration Date:   06/01/2022    Scheduling Instructions:     Please make sure that the patient understands that this needs to be done as soon as possible. Never have the patient do the imaging "just before the next appointment". Inform patient that having the imaging done within the [facility] will expedite the availability of the results and will provide      imaging availability to the requesting physician. In addition inform the patient that the imaging order has an expiration date and will not be renewed if not done within the active period.    Order Specific Question:   What is the  patient's sedation requirement?    Answer:   No Sedation    Order Specific Question:   Does the patient have a pacemaker or implanted devices?    Answer:   No    Order Specific Question:   Preferred imaging location?    Answer:   [facility] (table limit-350lbs)    Order Specific Question:   Call Results- Best Contact Number?    Answer:   [phone] ([facility])    Order Specific Question:   Radiology Contrast Protocol - do NOT remove file path    Answer:   [email or web address]     Follow-up plan:   Return in about 3 months (around 08/01/2022) for Medication Management, in person.    Recent Visits Date Type Provider Dept  03/14/22 Procedure visit Gillis Santa, MD [facility]  02/06/22 Office Visit Gillis Santa, MD [facility]  Showing recent visits within past 90 days and meeting all other requirements Today's Visits Date Type Provider Dept  05/03/22 Office Visit Gillis Santa, MD [facility]  Showing today's visits and meeting all other requirements Future Appointments No visits were found meeting these conditions. Showing future appointments within next 90 days and meeting all other requirements  I discussed the assessment and treatment plan with the patient. The patient was provided an opportunity to ask questions and all were answered. The patient agreed with the plan and demonstrated an understanding of the instructions.  Patient advised to call back or seek an in-person evaluation if the symptoms or condition worsens.  Duration of encounter:38mnutes.  Note by: BGillis Santa MD Date: 05/03/2022; Time:  10:55 AM

## 2022-05-03 NOTE — Progress Notes (Signed)
Nursing Pain Medication Assessment:  Safety precautions to be maintained throughout the outpatient stay will include: orient to surroundings, keep bed in low position, maintain call bell within reach at all times, provide assistance with transfer out of bed and ambulation.  Medication Inspection Compliance: Pill count conducted under aseptic conditions, in front of the patient. Neither the pills nor the bottle was removed from the patient's sight at any time. Once count was completed pills were immediately returned to the patient in their original bottle.  Medication: Oxycodone IR Pill/Patch Count:  17 of 30 pills remain Pill/Patch Appearance: Markings consistent with prescribed medication Bottle Appearance: Standard pharmacy container. Clearly labeled. Filled Date: 19 / 5 / 2024 Last Medication intake:  Day before yesterdayNursing Pain Medication Assessment:  Safety precautions to be maintained throughout the outpatient stay will include: orient to surroundings, keep bed in low position, maintain call bell within reach at all times, provide assistance with transfer out of bed and ambulation.  Medication Inspection Compliance: Pill count conducted under aseptic conditions, in front of the patient. Neither the pills nor the bottle was removed from the patient's sight at any time. Once count was completed pills were immediately returned to the patient in their original bottle.  Medication: Buprenorphine (Suboxone) Pill/Patch Count:  0 of 4 patches remain Pill/Patch Appearance: Markings consistent with prescribed medication Bottle Appearance: Standard pharmacy container. Clearly labeled. Filled Date: 01 / 23 / 2024 Last Medication intake:  TodaySafety precautions to be maintained throughout the outpatient stay will include: orient to surroundings, keep bed in low position, maintain call bell within reach at all times, provide assistance with transfer out of bed and ambulation.

## 2022-05-10 ENCOUNTER — Ambulatory Visit
Admission: RE | Admit: 2022-05-10 | Discharge: 2022-05-10 | Disposition: A | Discharge status: Home / Self Care | Payer: Medicare Other | Source: Ambulatory Visit | Attending: Student in an Organized Health Care Education/Training Program | Admitting: Student in an Organized Health Care Education/Training Program

## 2022-05-10 DIAGNOSIS — M48061 Spinal stenosis, lumbar region without neurogenic claudication: Secondary | ICD-10-CM

## 2022-05-10 DIAGNOSIS — M5416 Radiculopathy, lumbar region: Secondary | ICD-10-CM

## 2022-05-17 ENCOUNTER — Ambulatory Visit
Payer: Medicare Other | Attending: Student in an Organized Health Care Education/Training Program | Admitting: Student in an Organized Health Care Education/Training Program

## 2022-05-17 ENCOUNTER — Encounter: Payer: Self-pay | Admitting: Student in an Organized Health Care Education/Training Program

## 2022-05-17 VITALS — BP 131/71 | HR 65 | Temp 97.2°F | Resp 17 | Ht 63.0 in | Wt 180.0 lb

## 2022-05-17 DIAGNOSIS — M47816 Spondylosis without myelopathy or radiculopathy, lumbar region: Secondary | ICD-10-CM | POA: Diagnosis not present

## 2022-05-17 DIAGNOSIS — G894 Chronic pain syndrome: Secondary | ICD-10-CM | POA: Diagnosis not present

## 2022-05-17 NOTE — Patient Instructions (Signed)
GENERAL RISKS AND COMPLICATIONS  What are the risk, side effects and possible complications? Generally speaking, most procedures are safe.  However, with any procedure there are risks, side effects, and the possibility of complications.  The risks and complications are dependent upon the sites that are lesioned, or the type of nerve block to be performed.  The closer the procedure is to the spine, the more serious the risks are.  Great care is taken when placing the radio frequency needles, block needles or lesioning probes, but sometimes complications can occur. Infection: Any time there is an injection through the skin, there is a risk of infection.  This is why sterile conditions are used for these blocks.  There are four possible types of infection. Localized skin infection. Central Nervous System Infection-This can be in the form of Meningitis, which can be deadly. Epidural Infections-This can be in the form of an epidural abscess, which can cause pressure inside of the spine, causing compression of the spinal cord with subsequent paralysis. This would require an emergency surgery to decompress, and there are no guarantees that the patient would recover from the paralysis. Discitis-This is an infection of the intervertebral discs.  It occurs in about 1% of discography procedures.  It is difficult to treat and it may lead to surgery.        2. Pain: the needles have to go through skin and soft tissues, will cause soreness.       3. Damage to internal structures:  The nerves to be lesioned may be near blood vessels or    other nerves which can be potentially damaged.       4. Bleeding: Bleeding is more common if the patient is taking blood thinners such as  aspirin, Coumadin, Ticiid, Plavix, etc., or if he/she have some genetic predisposition  such as hemophilia. Bleeding into the spinal canal can cause compression of the spinal  cord with subsequent paralysis.  This would require an emergency  surgery to  decompress and there are no guarantees that the patient would recover from the  paralysis.       5. Pneumothorax:  Puncturing of a lung is a possibility, every time a needle is introduced in  the area of the chest or upper back.  Pneumothorax refers to free air around the  collapsed lung(s), inside of the thoracic cavity (chest cavity).  Another two possible  complications related to a similar event would include: Hemothorax and Chylothorax.   These are variations of the Pneumothorax, where instead of air around the collapsed  lung(s), you may have blood or chyle, respectively.       6. Spinal headaches: They may occur with any procedures in the area of the spine.       7. Persistent CSF (Cerebro-Spinal Fluid) leakage: This is a rare problem, but may occur  with prolonged intrathecal or epidural catheters either due to the formation of a fistulous  track or a dural tear.       8. Nerve damage: By working so close to the spinal cord, there is always a possibility of  nerve damage, which could be as serious as a permanent spinal cord injury with  paralysis.       9. Death:  Although rare, severe deadly allergic reactions known as "Anaphylactic  reaction" can occur to any of the medications used.      10. Worsening of the symptoms:  We can always make thing worse.  What are the chances   of something like this happening? Chances of any of this occuring are extremely low.  By statistics, you have more of a chance of getting killed in a motor vehicle accident: while driving to the hospital than any of the above occurring .  Nevertheless, you should be aware that they are possibilities.  In general, it is similar to taking a shower.  Everybody knows that you can slip, hit your head and get killed.  Does that mean that you should not shower again?  Nevertheless always keep in mind that statistics do not mean anything if you happen to be on the wrong side of them.  Even if a procedure has a 1 (one) in a  1,000,000 (million) chance of going wrong, it you happen to be that one..Also, keep in mind that by statistics, you have more of a chance of having something go wrong when taking medications.  Who should not have this procedure? If you are on a blood thinning medication (e.g. Coumadin, Plavix, see list of "Blood Thinners"), or if you have an active infection going on, you should not have the procedure.  If you are taking any blood thinners, please inform your physician.  How should I prepare for this procedure? Do not eat or drink anything at least six hours prior to the procedure. Bring a driver with you .  It cannot be a taxi. Come accompanied by an adult that can drive you back, and that is strong enough to help you if your legs get weak or numb from the local anesthetic. Take all of your medicines the morning of the procedure with just enough water to swallow them. If you have diabetes, make sure that you are scheduled to have your procedure done first thing in the morning, whenever possible. If you have diabetes, take only half of your insulin dose and notify our nurse that you have done so as soon as you arrive at the clinic. If you are diabetic, but only take blood sugar pills (oral hypoglycemic), then do not take them on the morning of your procedure.  You may take them after you have had the procedure. Do not take aspirin or any aspirin-containing medications, at least eleven (11) days prior to the procedure.  They may prolong bleeding. Wear loose fitting clothing that may be easy to take off and that you would not mind if it got stained with Betadine or blood. Do not wear any jewelry or perfume Remove any nail coloring.  It will interfere with some of our monitoring equipment.  NOTE: Remember that this is not meant to be interpreted as a complete list of all possible complications.  Unforeseen problems may occur.  BLOOD THINNERS The following drugs contain aspirin or other products,  which can cause increased bleeding during surgery and should not be taken for 2 weeks prior to and 1 week after surgery.  If you should need take something for relief of minor pain, you may take acetaminophen which is found in Tylenol,m Datril, Anacin-3 and Panadol. It is not blood thinner. The products listed below are.  Do not take any of the products listed below in addition to any listed on your instruction sheet.  A.P.C or A.P.C with Codeine Codeine Phosphate Capsules #3 Ibuprofen Ridaura  ABC compound Congesprin Imuran rimadil  Advil Cope Indocin Robaxisal  Alka-Seltzer Effervescent Pain Reliever and Antacid Coricidin or Coricidin-D  Indomethacin Rufen  Alka-Seltzer plus Cold Medicine Cosprin Ketoprofen S-A-C Tablets  Anacin Analgesic Tablets or Capsules Coumadin   Korlgesic Salflex  Anacin Extra Strength Analgesic tablets or capsules CP-2 Tablets Lanoril Salicylate  Anaprox Cuprimine Capsules Levenox Salocol  Anexsia-D Dalteparin Magan Salsalate  Anodynos Darvon compound Magnesium Salicylate Sine-off  Ansaid Dasin Capsules Magsal Sodium Salicylate  Anturane Depen Capsules Marnal Soma  APF Arthritis pain formula Dewitt's Pills Measurin Stanback  Argesic Dia-Gesic Meclofenamic Sulfinpyrazone  Arthritis Bayer Timed Release Aspirin Diclofenac Meclomen Sulindac  Arthritis pain formula Anacin Dicumarol Medipren Supac  Analgesic (Safety coated) Arthralgen Diffunasal Mefanamic Suprofen  Arthritis Strength Bufferin Dihydrocodeine Mepro Compound Suprol  Arthropan liquid Dopirydamole Methcarbomol with Aspirin Synalgos  ASA tablets/Enseals Disalcid Micrainin Tagament  Ascriptin Doan's Midol Talwin  Ascriptin A/D Dolene Mobidin Tanderil  Ascriptin Extra Strength Dolobid Moblgesic Ticlid  Ascriptin with Codeine Doloprin or Doloprin with Codeine Momentum Tolectin  Asperbuf Duoprin Mono-gesic Trendar  Aspergum Duradyne Motrin or Motrin IB Triminicin  Aspirin plain, buffered or enteric coated  Durasal Myochrisine Trigesic  Aspirin Suppositories Easprin Nalfon Trillsate  Aspirin with Codeine Ecotrin Regular or Extra Strength Naprosyn Uracel  Atromid-S Efficin Naproxen Ursinus  Auranofin Capsules Elmiron Neocylate Vanquish  Axotal Emagrin Norgesic Verin  Azathioprine Empirin or Empirin with Codeine Normiflo Vitamin E  Azolid Emprazil Nuprin Voltaren  Bayer Aspirin plain, buffered or children's or timed BC Tablets or powders Encaprin Orgaran Warfarin Sodium  Buff-a-Comp Enoxaparin Orudis Zorpin  Buff-a-Comp with Codeine Equegesic Os-Cal-Gesic   Buffaprin Excedrin plain, buffered or Extra Strength Oxalid   Bufferin Arthritis Strength Feldene Oxphenbutazone   Bufferin plain or Extra Strength Feldene Capsules Oxycodone with Aspirin   Bufferin with Codeine Fenoprofen Fenoprofen Pabalate or Pabalate-SF   Buffets II Flogesic Panagesic   Buffinol plain or Extra Strength Florinal or Florinal with Codeine Panwarfarin   Buf-Tabs Flurbiprofen Penicillamine   Butalbital Compound Four-way cold tablets Penicillin   Butazolidin Fragmin Pepto-Bismol   Carbenicillin Geminisyn Percodan   Carna Arthritis Reliever Geopen Persantine   Carprofen Gold's salt Persistin   Chloramphenicol Goody's Phenylbutazone   Chloromycetin Haltrain Piroxlcam   Clmetidine heparin Plaquenil   Cllnoril Hyco-pap Ponstel   Clofibrate Hydroxy chloroquine Propoxyphen         Before stopping any of these medications, be sure to consult the physician who ordered them.  Some, such as Coumadin (Warfarin) are ordered to prevent or treat serious conditions such as "deep thrombosis", "pumonary embolisms", and other heart problems.  The amount of time that you may need off of the medication may also vary with the medication and the reason for which you were taking it.  If you are taking any of these medications, please make sure you notify your pain physician before you undergo any procedures.         Facet Blocks Patient  Information  Description: The facets are joints in the spine between the vertebrae.  Like any joints in the body, facets can become irritated and painful.  Arthritis can also effect the facets.  By injecting steroids and local anesthetic in and around these joints, we can temporarily block the nerve supply to them.  Steroids act directly on irritated nerves and tissues to reduce selling and inflammation which often leads to decreased pain.  Facet blocks may be done anywhere along the spine from the neck to the low back depending upon the location of your pain.   After numbing the skin with local anesthetic (like Novocaine), a small needle is passed onto the facet joints under x-ray guidance.  You may experience a sensation of pressure while this is being done.  The   entire block usually lasts about 15-25 minutes.   Conditions which may be treated by facet blocks:  Low back/buttock pain Neck/shoulder pain Certain types of headaches  Preparation for the injection:  Do not eat any solid food or dairy products within 8 hours of your appointment. You may drink clear liquid up to 3 hours before appointment.  Clear liquids include water, black coffee, juice or soda.  No milk or cream please. You may take your regular medication, including pain medications, with a sip of water before your appointment.  Diabetics should hold regular insulin (if taken separately) and take 1/2 normal NPH dose the morning of the procedure.  Carry some sugar containing items with you to your appointment. A driver must accompany you and be prepared to drive you home after your procedure. Bring all your current medications with you. An IV may be inserted and sedation may be given at the discretion of the physician. A blood pressure cuff, EKG and other monitors will often be applied during the procedure.  Some patients may need to have extra oxygen administered for a short period. You will be asked to provide medical information,  including your allergies and medications, prior to the procedure.  We must know immediately if you are taking blood thinners (like Coumadin/Warfarin) or if you are allergic to IV iodine contrast (dye).  We must know if you could possible be pregnant.  Possible side-effects:  Bleeding from needle site Infection (rare, may require surgery) Nerve injury (rare) Numbness & tingling (temporary) Difficulty urinating (rare, temporary) Spinal headache (a headache worse with upright posture) Light-headedness (temporary) Pain at injection site (serveral days) Decreased blood pressure (rare, temporary) Weakness in arm/leg (temporary) Pressure sensation in back/neck (temporary)   Call if you experience:  Fever/chills associated with headache or increased back/neck pain Headache worsened by an upright position New onset, weakness or numbness of an extremity below the injection site Hives or difficulty breathing (go to the emergency room) Inflammation or drainage at the injection site(s) Severe back/neck pain greater than usual New symptoms which are concerning to you  Please note:  Although the local anesthetic injected can often make your back or neck feel good for several hours after the injection, the pain will likely return. It takes 3-7 days for steroids to work.  You may not notice any pain relief for at least one week.  If effective, we will often do a series of 2-3 injections spaced 3-6 weeks apart to maximally decrease your pain.  After the initial series, you may be a candidate for a more permanent nerve block of the facets.  If you have any questions, please call [phone] [facility] 

## 2022-05-17 NOTE — Progress Notes (Signed)
Safety precautions to be maintained throughout the outpatient stay will include: orient to surroundings, keep bed in low position, maintain call bell within reach at all times, provide assistance with transfer out of bed and ambulation.  

## 2022-05-17 NOTE — Progress Notes (Signed)
PROVIDER NOTE: Information contained herein reflects review and annotations entered in association with encounter. Interpretation of such information and data should be left to medically-trained personnel. Information provided to patient can be located elsewhere in the medical record under "Patient Instructions". Document created using STT-dictation technology, any transcriptional errors that may result from process are unintentional.    Patient: Leslie Carrillo  Service Category: E/M  Provider: Gillis Santa, MD  DOB: 1960/05/20  DOS: 05/17/2022  Referring Provider: Gladstone Lighter, MD  MRN: [id number]  Specialty: Interventional Pain Management  PCP: Gladstone Lighter, MD  Type: Established Patient  Setting: Ambulatory outpatient    Location: Office  Delivery: Face-to-face     HPI  [name], a 62 y.o. year old female, is here today because of her Lumbar facet arthropathy [M47.816]. [name] primary complain today is Back Pain (lower) Last encounter: My last encounter with her was on 05/03/2022. Pertinent problems: [name] has Chronic low back pain; DDD (degenerative disc disease), thoracic; Lumbar radiculopathy; Cervical facet joint syndrome; Bilateral primary osteoarthritis of knee; Lumbar degenerative disc disease; Chronic pain syndrome; Myofascial pain syndrome; and Lumbar spondylosis on their pertinent problem list. Pain Assessment: Severity of Chronic pain is reported as a 5 /10. Location: Back Lower/radiates through hips down legs to ankles bilat. Onset: More than a month ago. Quality: Aching, Constant. Timing: Constant. Modifying factor(s): patches, meds. Vitals:  height is '5\' 3"'$  (1.6 m) and weight is 180 lb (81.6 kg). Her temporal temperature is 97.2 F (36.2 C) (abnormal). Her blood pressure is 131/71 and her pulse is 65. Her respiration is 17 and oxygen saturation is 97%.  BMI: Estimated body mass index is 31.89 kg/m as calculated from the following:   Height as of this  encounter: '5\' 3"'$  (1.6 m).   Weight as of this encounter: 180 lb (81.6 kg).  Reason for encounter: follow-up evaluation to review lumbar MRI.  Patient states that she is having low back pain that is worse on the left.  This is most pronounced in her lower back and left buttock area.  Occasionally it does radiate down her left leg.  Lumbar MRI results are below.  Pharmacotherapy Assessment  Analgesic: Buprenorphine transdermal patch, 15 mcg an hour, oxycodone 5 mg daily as needed for breakthrough pain, quantity #30 to last for 90 days MME/day: 10 mg/day   Monitoring: [place] PMP: PDMP not reviewed this encounter.       Pharmacotherapy: No side-effects or adverse reactions reported. Compliance: No problems identified. Effectiveness: Clinically acceptable.  Rise Patience, RN  05/17/2022  1:25 PM  Sign when Signing Visit Safety precautions to be maintained throughout the outpatient stay will include: orient to surroundings, keep bed in low position, maintain call bell within reach at all times, provide assistance with transfer out of bed and ambulation.     No results found for: "CBDTHCR" No results found for: "D8THCCBX" No results found for: "D9THCCBX"  UDS:  Summary  Date Value Ref Range Status  02/06/2022 Note  Final    Comment:    ==================================================================== [facility] (MW) ==================================================================== Test                             Result       Flag       Units  Drug Present and Declared for Prescription Verification   Oxycodone  279          EXPECTED   ng/mg creat   Oxymorphone                    167          EXPECTED   ng/mg creat   Noroxycodone                   650          EXPECTED   ng/mg creat   Noroxymorphone                 64           EXPECTED   ng/mg creat    Sources of oxycodone are scheduled prescription medications.    Oxymorphone, noroxycodone, and  noroxymorphone are expected    metabolites of oxycodone. Oxymorphone is also available as a    scheduled prescription medication.    Buprenorphine                  30           EXPECTED   ng/mg creat   Norbuprenorphine               39           EXPECTED   ng/mg creat    Source of buprenorphine is a scheduled prescription medication.    Norbuprenorphine is an expected metabolite of buprenorphine.  ==================================================================== Test                      Result    Flag   Units      Ref Range   Creatinine              107              mg/dL      >=20 ==================================================================== Declared Medications:  The flagging and interpretation on this report are based on the  following declared medications.  Unexpected results may arise from  inaccuracies in the declared medications.   **Note: The testing scope of this panel includes these medications:   Oxycodone (Roxicodone)   **Note: The testing scope of this panel does not include small to  moderate amounts of these reported medications:   Buprenorphine Patch (BuTrans)   **Note: The testing scope of this panel does not include the  following reported medications:   Acetaminophen (Tylenol)  Cholecalciferol  Clobetasol  Clonidine (Catapres)  Sertraline (Zoloft) ==================================================================== For clinical consultation, please call [phone]. ====================================================================       ROS  Constitutional: Denies any fever or chills Gastrointestinal: No reported hemesis, hematochezia, vomiting, or acute GI distress Musculoskeletal:  Axial low back pain Neurological:  Radiation to left leg at times, paresthesias noted  Medication Review  acetaminophen, buprenorphine, clobetasol, hydrOXYzine, multivitamin, oxyCODONE, and sertraline  History Review  Allergy: [name] is allergic  to codeine, pregabalin, hydrocodone-acetaminophen, tramadol, latex, lovastatin, and simvastatin. Drug: [name]  reports no history of drug use. Alcohol:  reports no history of alcohol use. Tobacco:  reports that she has been smoking cigarettes. She has a 10.00 pack-year smoking history. She has never used smokeless tobacco. Social: [name]  reports that she has been smoking cigarettes. She has a 10.00 pack-year smoking history. She has never used smokeless tobacco. She reports that she does not drink alcohol and does not use drugs. Medical:  has a past medical history of Anemia, Arthritis, Asthma,  Chronic kidney disease, Chronic pain, and History of kidney stones. Surgical: [name]  has a past surgical history that includes Cervical fusion (2014); carpel tunnel (Right); Cholecystectomy; c sections (N/A, 1985); Colonoscopy; and Shoulder arthroscopy with subacromial decompression, rotator cuff repair and bicep tendon repair (Right, 11/08/2020). Family: family history includes Dementia in her mother; Prostate cancer in her father; Renal Cyst in her father.  Laboratory Chemistry Profile   Renal Lab Results  Component Value Date   BUN 11 11/08/2020   CREATININE 0.97 11/08/2020   GFRAA >60 10/16/2019   GFRNONAA >60 11/08/2020    Hepatic Lab Results  Component Value Date   AST 20 10/16/2019   ALT 15 10/16/2019   ALBUMIN 4.0 10/16/2019   ALKPHOS 75 10/16/2019   LIPASE 29 10/16/2019    Electrolytes Lab Results  Component Value Date   NA 137 11/08/2020   K 3.9 11/08/2020   CL 102 11/08/2020   CALCIUM 9.0 11/08/2020    Bone No results found for: "VD25OH", "VD125OH2TOT", "[id number]", "[id number]", "25OHVITD1", "25OHVITD2", "25OHVITD3", "TESTOFREE", "TESTOSTERONE"  Inflammation (CRP: Acute Phase) (ESR: Chronic Phase) No results found for: "CRP", "ESRSEDRATE", "LATICACIDVEN"       Note: Above Lab results reviewed.  Recent Imaging Review  MR LUMBAR SPINE WO CONTRAST CLINICAL DATA:   Low back pain, symptoms persist with > 6 wks treatment Lumbar radiculopathy, symptoms persist with > 6 wks treatment  EXAM: MRI LUMBAR SPINE WITHOUT CONTRAST  TECHNIQUE: Multiplanar, multisequence MR imaging of the lumbar spine was performed. No intravenous contrast was administered.  COMPARISON:  Lumbar radiographs 03/31/2019.  FINDINGS: Segmentation: Standard segmentation is assumed. The inferior-most fully formed intervertebral disc is labeled L5-S1.  Alignment:  Slight (grade 1) anterolisthesis of L4 on L5.  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 No specific evidence of acute fracture or discitis/osteomyelitis. Degenerative/discogenic endplate signal changes about the posterior L3-L4 disc.  Conus medullaris and cauda equina: Conus extends to the L2 level. Conus appears normal.  Paraspinal and other soft tissues: Unremarkable.  Disc levels:  T12-L1: No significant disc protrusion, foraminal stenosis, or canal stenosis.  L1-L2: Mild disc bulging. Bilateral facet arthropathy and ligamentum flavum thickening. Resulting mild right foraminal stenosis. Patent canal and left foramen.  L2-L3: Disc bulge with superimposed right subarticular disc protrusion. Resulting mild to moderate right subarticular recess stenosis with some aggregation of cauda quinine nerve roots. Patent central canal. Mild right foraminal stenosis. Patent left foramen.  L3-L4: Disc bulging and bilateral facet arthropathy. Mild to moderate right and mild left foraminal stenosis. Patent canal.  L4-L5: Left eccentric disc bulging and left greater than right facet arthropathy. Resulting moderate to severe left and mild right foraminal stenosis. Patent canal.  L5-S1: Mild disc bulging. Bilateral facet arthropathy. Mild to moderate left and mild right foraminal stenosis. Patent canal.  IMPRESSION: 1. At L4-L5, moderate to severe left and mild right  foraminal stenosis with severe left facet arthropathy. 2. At L5-S1, mild to moderate left and mild right foraminal stenosis. 3. At L3-L4, mild-to-moderate right and mild left foraminal stenosis. 4. At L2-L3, mild to moderate right subarticular recess stenosis due to right subarticular disc protrusion. Mild right foraminal stenosis.  Electronically Signed   By: Margaretha Sheffield M.D.   On: 05/13/2022 15:35 Note: Reviewed        Physical Exam  General appearance: Well nourished, well developed, and well hydrated. In no apparent acute distress Mental status: Alert, oriented x 3 (person, place, & time)  Respiratory: No evidence of acute respiratory distress Eyes: PERLA Vitals: BP 131/71   Pulse 65   Temp (!) 97.2 F (36.2 C) (Temporal)   Resp 17   Ht '5\' 3"'$  (1.6 m)   Wt 180 lb (81.6 kg)   SpO2 97%   BMI 31.89 kg/m  BMI: Estimated body mass index is 31.89 kg/m as calculated from the following:   Height as of this encounter: '5\' 3"'$  (1.6 m).   Weight as of this encounter: 180 lb (81.6 kg). Ideal: Ideal body weight: 52.4 kg (115 lb 8.3 oz) Adjusted ideal body weight: 64.1 kg (141 lb 5 oz)  Lumbar Spine Area Exam  Skin & Axial Inspection: No masses, redness, or swelling Alignment: Symmetrical Functional ROM: Pain restricted ROM affecting both sides Stability: No instability detected Muscle Tone/Strength: Functionally intact. No obvious neuro-muscular anomalies detected. Sensory (Neurological): Musculoskeletal pain pattern bilaterally, left greater than right Palpation: Complains of area being tender to palpation       Provocative Tests: Hyperextension/rotation test: (+) bilaterally for facet joint pain. Lumbar quadrant test (Kemp's test): (+) bilaterally for facet joint pain.  Gait & Posture Assessment  Ambulation: Unassisted Gait: Relatively normal for age and body habitus Posture: WNL   Lower Extremity Exam    Side: Right lower extremity  Side: Left lower extremity   Stability: No instability observed          Stability: No instability observed          Skin & Extremity Inspection: Skin color, temperature, and hair growth are WNL. No peripheral edema or cyanosis. No masses, redness, swelling, asymmetry, or associated skin lesions. No contractures.  Skin & Extremity Inspection: Skin color, temperature, and hair growth are WNL. No peripheral edema or cyanosis. No masses, redness, swelling, asymmetry, or associated skin lesions. No contractures.  Functional ROM: Unrestricted ROM                  Functional ROM: Unrestricted ROM                  Muscle Tone/Strength: Functionally intact. No obvious neuro-muscular anomalies detected.  Muscle Tone/Strength: Functionally intact. No obvious neuro-muscular anomalies detected.  Sensory (Neurological): Unimpaired        Sensory (Neurological): Unimpaired        DTR: Patellar: deferred today Achilles: deferred today Plantar: deferred today  DTR: Patellar: deferred today Achilles: deferred today Plantar: deferred today  Palpation: No palpable anomalies  Palpation: No palpable anomalies    Assessment   Diagnosis Status  1. Lumbar facet arthropathy   2. Lumbar spondylosis   3. Chronic pain syndrome    Having a Flare-up Having a Flare-up Controlled   Updated Problems: Problem  Lumbar Spondylosis    Plan of Care  Leslie Carrillo has a history of greater than 3 months of moderate to severe pain which is resulted in functional impairment.  The patient has tried various conservative therapeutic options such as NSAIDs, Tylenol, muscle relaxants, physical therapy which was inadequately effective.  Patient's pain is predominantly axial with physical exam and L-MRI findings suggestive of facet arthropathy. Lumbar facet medial branch nerve blocks were discussed with the patient.  Risks and benefits were reviewed.  Patient would like to proceed with bilateral L3, L4, L5 medial branch nerve block.   Orders:  Orders  Placed This Encounter  Procedures   LUMBAR FACET(MEDIAL BRANCH NERVE BLOCK) MBNB    Standing Status:   Future    Standing Expiration Date:  08/15/2022    Scheduling Instructions:     Procedure: Lumbar facet block (AKA.: Lumbosacral medial branch nerve block) #2     Side: Bilateral     Level: L3-4, L4-5, Facets (L3, L4, L5, Medial Branch)     Sedation: Patient's choice.     Timeframe: ASAA    Order Specific Question:   Where will this procedure be performed?    Answer:   [facility]   Follow-up plan:   Return in about 2 weeks (around 05/31/2022) for B/L L3, 4, 5 MBNB, in clinic [facility].      Has tried cervical and thoracic epidural steroid injections with physiatry at [facility].  Consider thoracic TPI.  Consider lumbar facet medial branch nerve blocks for severe facet arthropathy lower lumbar spine present on x-ray.  Could also consider knee intra-articular steroid, Hyalgan, genicular nerve block. S/p right shoulder injection 12/23/19, 02/03/20 right shoulder MRI-supraspinatus tendon tear with infraspinatus tendinopathy, has been told that she needs to have right shoulder surgery, tentative plan early August.       Recent Visits Date Type Provider Dept  05/03/22 Office Visit Gillis Santa, MD [facility]  03/14/22 Procedure visit Gillis Santa, MD [facility]  Showing recent visits within past 90 days and meeting all other requirements Today's Visits Date Type Provider Dept  05/17/22 Office Visit Gillis Santa, MD [facility]  Showing today's visits and meeting all other requirements Future Appointments Date Type Provider Dept  06/13/22 Appointment Gillis Santa, MD [facility]  07/24/22 Appointment Gillis Santa, MD [facility]  Showing future appointments within next 90 days and meeting all other requirements  I discussed the assessment and treatment plan with the patient. The patient was provided an opportunity to ask questions and  all were answered. The patient agreed with the plan and demonstrated an understanding of the instructions.  Patient advised to call back or seek an in-person evaluation if the symptoms or condition worsens.  Duration of encounter: 33mnutes.  Total time on encounter, as per AMA guidelines included both the face-to-face and non-face-to-face time personally spent by the physician and/or other qualified health care professional(s) on the day of the encounter (includes time in activities that require the physician or other qualified health care professional and does not include time in activities normally performed by clinical staff). Physician's time may include the following activities when performed: Preparing to see the patient (e.g., pre-charting review of records, searching for previously ordered imaging, lab work, and nerve conduction tests) Review of prior analgesic pharmacotherapies. Reviewing PMP Interpreting ordered tests (e.g., lab work, imaging, nerve conduction tests) Performing post-procedure evaluations, including interpretation of diagnostic procedures Obtaining and/or reviewing separately obtained history Performing a medically appropriate examination and/or evaluation Counseling and educating the patient/family/caregiver Ordering medications, tests, or procedures Referring and communicating with other health care professionals (when not separately reported) Documenting clinical information in the electronic or other health record Independently interpreting results (not separately reported) and communicating results to the patient/ family/caregiver Care coordination (not separately reported)  Note by: BGillis Santa MD Date: 05/17/2022; Time: 2:39 PM

## 2022-06-13 ENCOUNTER — Encounter: Payer: Self-pay | Admitting: Student in an Organized Health Care Education/Training Program

## 2022-06-13 ENCOUNTER — Ambulatory Visit
Admission: RE | Admit: 2022-06-13 | Discharge: 2022-06-13 | Disposition: A | Discharge status: Home / Self Care | Payer: Medicare Other | Source: Ambulatory Visit | Attending: Student in an Organized Health Care Education/Training Program | Admitting: Student in an Organized Health Care Education/Training Program

## 2022-06-13 ENCOUNTER — Ambulatory Visit
Payer: Medicare Other | Attending: Student in an Organized Health Care Education/Training Program | Admitting: Student in an Organized Health Care Education/Training Program

## 2022-06-13 VITALS — BP 125/81 | HR 66 | Temp 97.3°F | Resp 18 | Ht 63.0 in | Wt 180.0 lb

## 2022-06-13 DIAGNOSIS — G894 Chronic pain syndrome: Secondary | ICD-10-CM | POA: Insufficient documentation

## 2022-06-13 DIAGNOSIS — M47816 Spondylosis without myelopathy or radiculopathy, lumbar region: Secondary | ICD-10-CM | POA: Insufficient documentation

## 2022-06-13 MED ORDER — DEXAMETHASONE SODIUM PHOSPHATE 10 MG/ML IJ SOLN
10.0000 mg | Freq: Once | INTRAMUSCULAR | Status: AC
  Administered 2022-06-13: 10 mg

## 2022-06-13 MED ORDER — DEXAMETHASONE SODIUM PHOSPHATE 10 MG/ML IJ SOLN
INTRAMUSCULAR | Status: AC
  Filled 2022-06-13: qty 2

## 2022-06-13 MED ORDER — ROPIVACAINE HCL 2 MG/ML IJ SOLN
9.0000 mL | Freq: Once | INTRAMUSCULAR | Status: AC
  Administered 2022-06-13: 9 mL via PERINEURAL

## 2022-06-13 MED ORDER — ROPIVACAINE HCL 2 MG/ML IJ SOLN
INTRAMUSCULAR | Status: AC
  Filled 2022-06-13: qty 20

## 2022-06-13 MED ORDER — LIDOCAINE HCL 2 % IJ SOLN
20.0000 mL | Freq: Once | INTRAMUSCULAR | Status: AC
  Administered 2022-06-13: 400 mg

## 2022-06-13 MED ORDER — LIDOCAINE HCL 2 % IJ SOLN
INTRAMUSCULAR | Status: AC
  Filled 2022-06-13: qty 20

## 2022-06-13 NOTE — Patient Instructions (Signed)
Pain Management Discharge Instructions  General Discharge Instructions :  If you need to reach your doctor call: Monday-Friday 8:00 am - 4:00 pm at [phone] or toll free [phone].  After clinic hours [phone] to have operator reach doctor.  Bring all of your medication bottles to all your appointments in the pain clinic.  To cancel or reschedule your appointment with Pain Management please remember to call 24 hours in advance to avoid a fee.  Refer to the educational materials which you have been given on: General Risks, I had my Procedure. Discharge Instructions, Post Sedation.  Post Procedure Instructions:  The drugs you were given will stay in your system until tomorrow, so for the next 24 hours you should not drive, make any legal decisions or drink any alcoholic beverages.  You may eat anything you prefer, but it is better to start with liquids then soups and crackers, and gradually work up to solid foods.  Please notify your doctor immediately if you have any unusual bleeding, trouble breathing or pain that is not related to your normal pain.  Depending on the type of procedure that was done, some parts of your body may feel week and/or numb.  This usually clears up by tonight or the next day.  Walk with the use of an assistive device or accompanied by an adult for the 24 hours.  You may use ice on the affected area for the first 24 hours.  Put ice in a Ziploc bag and cover with a towel and place against area 15 minutes on 15 minutes off.  You may switch to heat after 24 hours.Facet Blocks Patient Information  Description: The facets are joints in the spine between the vertebrae.  Like any joints in the body, facets can become irritated and painful.  Arthritis can also effect the facets.  By injecting steroids and local anesthetic in and around these joints, we can temporarily block the nerve supply to them.  Steroids act directly on irritated nerves and tissues to  reduce selling and inflammation which often leads to decreased pain.  Facet blocks may be done anywhere along the spine from the neck to the low back depending upon the location of your pain.   After numbing the skin with local anesthetic (like Novocaine), a small needle is passed onto the facet joints under x-ray guidance.  You may experience a sensation of pressure while this is being done.  The entire block usually lasts about 15-25 minutes.   Conditions which may be treated by facet blocks:  Low back/buttock pain Neck/shoulder pain Certain types of headaches  Preparation for the injection:  Do not eat any solid food or dairy products within 8 hours of your appointment. You may drink clear liquid up to 3 hours before appointment.  Clear liquids include water, black coffee, juice or soda.  No milk or cream please. You may take your regular medication, including pain medications, with a sip of water before your appointment.  Diabetics should hold regular insulin (if taken separately) and take 1/2 normal NPH dose the morning of the procedure.  Carry some sugar containing items with you to your appointment. A driver must accompany you and be prepared to drive you home after your procedure. Bring all your current medications with you. An IV may be inserted and sedation may be given at the discretion of the physician. A blood pressure cuff, EKG and other monitors will often be applied during the procedure.  Some patients may need to   have extra oxygen administered for a short period. You will be asked to provide medical information, including your allergies and medications, prior to the procedure.  We must know immediately if you are taking blood thinners (like Coumadin/Warfarin) or if you are allergic to IV iodine contrast (dye).  We must know if you could possible be pregnant.  Possible side-effects:  Bleeding from needle site Infection (rare, may require surgery) Nerve injury (rare) Numbness  & tingling (temporary) Difficulty urinating (rare, temporary) Spinal headache (a headache worse with upright posture) Light-headedness (temporary) Pain at injection site (serveral days) Decreased blood pressure (rare, temporary) Weakness in arm/leg (temporary) Pressure sensation in back/neck (temporary)   Call if you experience:  Fever/chills associated with headache or increased back/neck pain Headache worsened by an upright position New onset, weakness or numbness of an extremity below the injection site Hives or difficulty breathing (go to the emergency room) Inflammation or drainage at the injection site(s) Severe back/neck pain greater than usual New symptoms which are concerning to you  Please note:  Although the local anesthetic injected can often make your back or neck feel good for several hours after the injection, the pain will likely return. It takes 3-7 days for steroids to work.  You may not notice any pain relief for at least one week.  If effective, we will often do a series of 2-3 injections spaced 3-6 weeks apart to maximally decrease your pain.  After the initial series, you may be a candidate for a more permanent nerve block of the facets.  If you have any questions, please call [phone] [facility] 

## 2022-06-13 NOTE — Progress Notes (Signed)
PROVIDER NOTE: Interpretation of information contained herein should be left to medically-trained personnel. Specific patient instructions are provided elsewhere under "Patient Instructions" section of medical record. This document was created in part using STT-dictation technology, any transcriptional errors that may result from this process are unintentional.  Patient: Leslie Carrillo Type: Established DOB: May 04, 1960 MRN: [id number] PCP: Gladstone Lighter, MD  Service: Procedure DOS: 06/13/2022 Setting: Ambulatory Location: Ambulatory outpatient facility Delivery: Face-to-face Provider: Gillis Santa, MD Specialty: Interventional Pain Management Specialty designation: 09 Location: Outpatient facility Ref. Prov.: Gladstone Lighter, MD       Interventional Therapy   Procedure: Lumbar Facet, Medial Branch Block(s) #1  Laterality: Bilateral  Level: L3, L4, and L5 Medial Branch Level(s). Injecting these levels blocks the L3-4 and L4-5 lumbar facet joints. Imaging: Fluoroscopic guidance         Anesthesia: Local anesthesia (1-2% Lidocaine) DOS: 06/13/2022 Performed by: Gillis Santa, MD  Primary Purpose: Diagnostic/Therapeutic Indications: Low back pain severe enough to impact quality of life or function. 1. Lumbar facet arthropathy   2. Lumbar spondylosis   3. Chronic pain syndrome    NAS-11 Pain score:   Pre-procedure: 4 /10   Post-procedure: 4 /10     Position / Prep / Materials:  Position: Prone  Prep solution: DuraPrep (Iodine Povacrylex [0.7% available iodine] and Isopropyl Alcohol, 74% w/w) Area Prepped: Posterolateral Lumbosacral Spine (Wide prep: From the lower border of the scapula down to the end of the tailbone and from flank to flank.)  Materials:  Tray: Block Needle(s):  Type: Spinal  Gauge (G): 22  Length: 3.5-in Qty: 3      Pre-op H&P Assessment:  Leslie Carrillo is a 62 y.o. (year old), female patient, seen today for interventional treatment. She  has a past  surgical history that includes Cervical fusion (2014); carpel tunnel (Right); Cholecystectomy; c sections (N/A, 1985); Colonoscopy; and Shoulder arthroscopy with subacromial decompression, rotator cuff repair and bicep tendon repair (Right, 11/08/2020). Leslie Carrillo has a current medication list which includes the following prescription(s): acetaminophen, buprenorphine, clobetasol, hydrochlorothiazide, hydroxyzine, multivitamin, and sertraline. Her primarily concern today is the Back Pain (lower)  Initial Vital Signs:  Pulse/HCG Rate: 66ECG Heart Rate: 74 Temp: (!) 97.3 F (36.3 C) Resp: 16 BP: 113/71 SpO2: 96 %  BMI: Estimated body mass index is 31.89 kg/m as calculated from the following:   Height as of this encounter: 5\' 3"  (1.6 m).   Weight as of this encounter: 180 lb (81.6 kg).  Risk Assessment: Allergies: Reviewed. She is allergic to codeine, pregabalin, hydrocodone-acetaminophen, tramadol, latex, lovastatin, and simvastatin.  Allergy Precautions: None required Coagulopathies: Reviewed. None identified.  Blood-thinner therapy: None at this time Active Infection(s): Reviewed. None identified. Leslie Carrillo is afebrile  Site Confirmation: Leslie Carrillo was asked to confirm the procedure and laterality before marking the site Procedure checklist: Completed Consent: Before the procedure and under the influence of no sedative(s), amnesic(s), or anxiolytics, the patient was informed of the treatment options, risks and possible complications. To fulfill our ethical and legal obligations, as recommended by the American Medical Association's Code of Ethics, I have informed the patient of my clinical impression; the nature and purpose of the treatment or procedure; the risks, benefits, and possible complications of the intervention; the alternatives, including doing nothing; the risk(s) and benefit(s) of the alternative treatment(s) or procedure(s); and the risk(s) and benefit(s) of doing nothing. The  patient was provided information about the general risks and possible complications associated with the procedure. These may include, but  are not limited to: failure to achieve desired goals, infection, bleeding, organ or nerve damage, allergic reactions, paralysis, and death. In addition, the patient was informed of those risks and complications associated to Spine-related procedures, such as failure to decrease pain; infection (i.e.: Meningitis, epidural or intraspinal abscess); bleeding (i.e.: epidural hematoma, subarachnoid hemorrhage, or any other type of intraspinal or peri-dural bleeding); organ or nerve damage (i.e.: Any type of peripheral nerve, nerve root, or spinal cord injury) with subsequent damage to sensory, motor, and/or autonomic systems, resulting in permanent pain, numbness, and/or weakness of one or several areas of the body; allergic reactions; (i.e.: anaphylactic reaction); and/or death. Furthermore, the patient was informed of those risks and complications associated with the medications. These include, but are not limited to: allergic reactions (i.e.: anaphylactic or anaphylactoid reaction(s)); adrenal axis suppression; blood sugar elevation that in diabetics may result in ketoacidosis or comma; water retention that in patients with history of congestive heart failure may result in shortness of breath, pulmonary edema, and decompensation with resultant heart failure; weight gain; swelling or edema; medication-induced neural toxicity; particulate matter embolism and blood vessel occlusion with resultant organ, and/or nervous system infarction; and/or aseptic necrosis of one or more joints. Finally, the patient was informed that Medicine is not an exact science; therefore, there is also the possibility of unforeseen or unpredictable risks and/or possible complications that may result in a catastrophic outcome. The patient indicated having understood very clearly. We have given the patient no  guarantees and we have made no promises. Enough time was given to the patient to ask questions, all of which were answered to the patient's satisfaction. Leslie Carrillo has indicated that she wanted to continue with the procedure. Attestation: I, the ordering provider, attest that I have discussed with the patient the benefits, risks, side-effects, alternatives, likelihood of achieving goals, and potential problems during recovery for the procedure that I have provided informed consent. Date  Time: 06/13/2022  1:27 PM   Pre-Procedure Preparation:  Monitoring: As per clinic protocol. Respiration, ETCO2, SpO2, BP, heart rate and rhythm monitor placed and checked for adequate function Safety Precautions: Patient was assessed for positional comfort and pressure points before starting the procedure. Time-out: I initiated and conducted the "Time-out" before starting the procedure, as per protocol. The patient was asked to participate by confirming the accuracy of the "Time Out" information. Verification of the correct person, site, and procedure were performed and confirmed by me, the nursing staff, and the patient. "Time-out" conducted as per Joint Commission's Universal Protocol (UP.01.01.01). Time: [id number] Start Time: 1425 hrs.  Description of Procedure:          Laterality: (see above) Targeted Levels: (see above)  Safety Precautions: Aspiration looking for blood return was conducted prior to all injections. At no point did we inject any substances, as a needle was being advanced. Before injecting, the patient was told to immediately notify me if she was experiencing any new onset of "ringing in the ears, or metallic taste in the mouth". No attempts were made at seeking any paresthesias. Safe injection practices and needle disposal techniques used. Medications properly checked for expiration dates. SDV (single dose vial) medications used. After the completion of the procedure, all disposable equipment used was  discarded in the proper designated medical waste containers. Local Anesthesia: Protocol guidelines were followed. The patient was positioned over the fluoroscopy table. The area was prepped in the usual manner. The time-out was completed. The target area was identified using fluoroscopy. A 12-in long,  straight, sterile hemostat was used with fluoroscopic guidance to locate the targets for each level blocked. Once located, the skin was marked with an approved surgical skin marker. Once all sites were marked, the skin (epidermis, dermis, and hypodermis), as well as deeper tissues (fat, connective tissue and muscle) were infiltrated with a small amount of a short-acting local anesthetic, loaded on a 10cc syringe with a 25G, 1.5-in  Needle. An appropriate amount of time was allowed for local anesthetics to take effect before proceeding to the next step. Local Anesthetic: Lidocaine 2.0% The unused portion of the local anesthetic was discarded in the proper designated containers. Technical description of process:   L3 Medial Branch Nerve Block (MBB): The target area for the L3 medial branch is at the junction of the postero-lateral aspect of the superior articular process and the superior, posterior, and medial edge of the transverse process of L4. Under fluoroscopic guidance, a Quincke needle was inserted until contact was made with os over the superior postero-lateral aspect of the pedicular shadow (target area). After negative aspiration for blood, 31mL of the nerve block solution was injected without difficulty or complication. The needle was removed intact. L4 Medial Branch Nerve Block (MBB): The target area for the L4 medial branch is at the junction of the postero-lateral aspect of the superior articular process and the superior, posterior, and medial edge of the transverse process of L5. Under fluoroscopic guidance, a Quincke needle was inserted until contact was made with os over the superior postero-lateral  aspect of the pedicular shadow (target area). After negative aspiration for blood, 2 mL of the nerve block solution was injected without difficulty or complication. The needle was removed intact. L5 Medial Branch Nerve Block (MBB): The target area for the L5 medial branch is at the junction of the postero-lateral aspect of the superior articular process and the superior, posterior, and medial edge of the sacral ala. Under fluoroscopic guidance, a Quincke needle was inserted until contact was made with os over the superior postero-lateral aspect of the pedicular shadow (target area). After negative aspiration for blood, 41mL of the nerve block solution was injected without difficulty or complication. The needle was removed intact.   Once the entire procedure was completed, the treated area was cleaned, making sure to leave some of the prepping solution back to take advantage of its long term bactericidal properties.         Illustration of the posterior view of the lumbar spine and the posterior neural structures. Laminae of L2 through S1 are labeled. DPRL5, dorsal primary ramus of L5; DPRS1, dorsal primary ramus of S1; DPR3, dorsal primary ramus of L3; FJ, facet (zygapophyseal) joint L3-L4; I, inferior articular process of L4; LB1, lateral branch of dorsal primary ramus of L1; IAB, inferior articular branches from L3 medial branch (supplies L4-L5 facet joint); IBP, intermediate branch plexus; MB3, medial branch of dorsal primary ramus of L3; NR3, third lumbar nerve root; S, superior articular process of L5; SAB, superior articular branches from L4 (supplies L4-5 facet joint also); TP3, transverse process of L3.   Facet Joint Innervation (* possible contribution)  L1-2 T12, L1 (L2*)  Medial Branch  L2-3 L1, L2 (L3*)         "          "  L3-4 L2, L3 (L4*)         "          "  L4-5 L3, L4 (L5*)         "          "  L5-S1 L4, L5, S1          "          "    Vitals:   06/13/22 1419 06/13/22 1424  06/13/22 1430 06/13/22 1435  BP: (!) 146/70 (!) 119/93 126/78 125/81  Pulse:      Resp: 16 18 16 18   Temp:      TempSrc:      SpO2: 95% 94% 95% 95%  Weight:      Height:         End Time: 1436 hrs.  Imaging Guidance (Spinal):          Type of Imaging Technique: Fluoroscopy Guidance (Spinal) Indication(s): Assistance in needle guidance and placement for procedures requiring needle placement in or near specific anatomical locations not easily accessible without such assistance. Exposure Time: Please see nurses notes. Contrast: None used. Fluoroscopic Guidance: I was personally present during the use of fluoroscopy. "Tunnel Vision Technique" used to obtain the best possible view of the target area. Parallax error corrected before commencing the procedure. "Direction-depth-direction" technique used to introduce the needle under continuous pulsed fluoroscopy. Once target was reached, antero-posterior, oblique, and lateral fluoroscopic projection used confirm needle placement in all planes. Images permanently stored in EMR. Interpretation: No contrast injected. I personally interpreted the imaging intraoperatively. Adequate needle placement confirmed in multiple planes. Permanent images saved into the patient's record.  Post-operative Assessment:  Post-procedure Vital Signs:  Pulse/HCG Rate: 66(!) 57 Temp: (!) 97.3 F (36.3 C) Resp: 18 BP: 125/81 SpO2: 95 %  EBL: None  Complications: No immediate post-treatment complications observed by team, or reported by patient.  Note: The patient tolerated the entire procedure well. A repeat set of vitals were taken after the procedure and the patient was kept under observation following institutional policy, for this type of procedure. Post-procedural neurological assessment was performed, showing return to baseline, prior to discharge. The patient was provided with post-procedure discharge instructions, including a section on how to identify  potential problems. Should any problems arise concerning this procedure, the patient was given instructions to immediately contact us, at any time, without hesitation. In any case, we plan to contact the patient by telephone for a follow-up status report regarding this interventional procedure.  Comments:  No additional relevant information.  Plan of Care (POC)  Orders:  Orders Placed This Encounter  Procedures   DG PAIN CLIN[facility] C-ARM 1-60 MIN NO REPORT    Intraoperative interpretation by procedural physician at [facility].    Standing Status:   Standing    Number of Occurrences:   1    Order Specific Question:   Reason for exam:    Answer:   Assistance in needle guidance and placement for procedures requiring needle placement in or near specific anatomical locations not easily accessible without such assistance.     Medications ordered for procedure: Meds ordered this encounter  Medications   lidocaine (XYLOCAINE) 2 % (with pres) injection 400 mg   dexamethasone (DECADRON) injection 10 mg   dexamethasone (DECADRON) injection 10 mg   ropivacaine (PF) 2 mg/mL (0.2%) (NAROPIN) injection 9 mL   ropivacaine (PF) 2 mg/mL (0.2%) (NAROPIN) injection 9 mL   Medications administered: We administered lidocaine, dexamethasone, dexamethasone, ropivacaine (PF) 2 mg/mL (0.2%), and ropivacaine (PF) 2 mg/mL (0.2%).  See the medical record for exact dosing, route, and time of administration.  Follow-up plan:   Return for Keep sch. appt.       Recent Visits Date Type Provider  Dept  05/17/22 Office Visit Gillis Santa, MD [facility]  05/03/22 Office Visit Gillis Santa, MD [facility]  Showing recent visits within past 90 days and meeting all other requirements Today's Visits Date Type Provider Dept  06/13/22 Procedure visit Gillis Santa, MD [facility]  Showing today's visits and meeting all other requirements Future Appointments Date Type  Provider Dept  07/24/22 Appointment Gillis Santa, MD [facility]  Showing future appointments within next 90 days and meeting all other requirements  Disposition: Discharge home  Discharge (Date  Time): 06/13/2022; 1446 hrs.   Primary Care Physician: Gladstone Lighter, MD Location: [facility] Note by: Gillis Santa, MD (TTS technology used. I apologize for any typographical errors that were not detected and corrected.) Date: 06/13/2022; Time: 3:00 PM  Disclaimer:  Medicine is not an Chief Strategy Officer. The only guarantee in medicine is that nothing is guaranteed. It is important to note that the decision to proceed with this intervention was based on the information collected from the patient. The Data and conclusions were drawn from the patient's questionnaire, the interview, and the physical examination. Because the information was provided in large part by the patient, it cannot be guaranteed that it has not been purposely or unconsciously manipulated. Every effort has been made to obtain as much relevant data as possible for this evaluation. It is important to note that the conclusions that lead to this procedure are derived in large part from the available data. Always take into account that the treatment will also be dependent on availability of resources and existing treatment guidelines, considered by other Pain Management Practitioners as being common knowledge and practice, at the time of the intervention. For Medico-Legal purposes, it is also important to point out that variation in procedural techniques and pharmacological choices are the acceptable norm. The indications, contraindications, technique, and results of the above procedure should only be interpreted and judged by a Board-Certified Interventional Pain Specialist with extensive familiarity and expertise in the same exact procedure and technique.

## 2022-06-14 ENCOUNTER — Telehealth: Payer: Self-pay

## 2022-06-14 NOTE — Telephone Encounter (Signed)
Call to patient to discuss post-procedure. Patient states she is doing well and denies any concerns.   Al Decant, RN

## 2022-07-24 ENCOUNTER — Encounter: Payer: Self-pay | Admitting: Student in an Organized Health Care Education/Training Program

## 2022-07-24 ENCOUNTER — Ambulatory Visit
Payer: Medicare Other | Attending: Student in an Organized Health Care Education/Training Program | Admitting: Student in an Organized Health Care Education/Training Program

## 2022-07-24 VITALS — BP 123/74 | HR 69 | Temp 97.3°F | Ht 63.0 in | Wt 180.0 lb

## 2022-07-24 DIAGNOSIS — G894 Chronic pain syndrome: Secondary | ICD-10-CM | POA: Diagnosis not present

## 2022-07-24 DIAGNOSIS — M48061 Spinal stenosis, lumbar region without neurogenic claudication: Secondary | ICD-10-CM

## 2022-07-24 DIAGNOSIS — M47816 Spondylosis without myelopathy or radiculopathy, lumbar region: Secondary | ICD-10-CM | POA: Insufficient documentation

## 2022-07-24 DIAGNOSIS — M5416 Radiculopathy, lumbar region: Secondary | ICD-10-CM | POA: Diagnosis not present

## 2022-07-24 MED ORDER — BUPRENORPHINE 15 MCG/HR TD PTWK: 1.0000 | MEDICATED_PATCH | TRANSDERMAL | 2 refills | Status: DC

## 2022-07-24 MED ORDER — OXYCODONE HCL 5 MG PO TABS: 5.0000 mg | ORAL_TABLET | Freq: Every day | ORAL | 0 refills | Status: AC | PRN

## 2022-07-24 NOTE — Progress Notes (Signed)
Nursing Pain Medication Assessment:  Safety precautions to be maintained throughout the outpatient stay will include: orient to surroundings, keep bed in low position, maintain call bell within reach at all times, provide assistance with transfer out of bed and ambulation.  Medication Inspection Compliance: Pill count conducted under aseptic conditions, in front of the patient. Neither the pills nor the bottle was removed from the patient's sight at any time. Once count was completed pills were immediately returned to the patient in their original bottle.  Medication #1: Oxycodone IR Pill/Patch Count:  11 of 30 pills remain Pill/Patch Appearance: Markings consistent with prescribed medication Bottle Appearance: Standard pharmacy container. Clearly labeled. Filled Date: 02 / 15 / 2024 Last Medication intake:   3 days ago  Medication #2: Buprenorphine (Suboxone) Pill/Patch Count:  1 of 4 pills remain Pill/Patch Appearance: Markings consistent with prescribed medication Bottle Appearance: Standard pharmacy container. Clearly labeled. Filled Date: 04 / 14 / 2024 Last Medication intake:   5 days ago Safety precautions to be maintained throughout the outpatient stay will include: orient to surroundings, keep bed in low position, maintain call bell within reach at all times, provide assistance with transfer out of bed and ambulation.

## 2022-07-24 NOTE — Progress Notes (Signed)
PROVIDER NOTE: Information contained herein reflects review and annotations entered in association with encounter. Interpretation of such information and data should be left to medically-trained personnel. Information provided to patient can be located elsewhere in the medical record under "Patient Instructions". Document created using STT-dictation technology, any transcriptional errors that may result from process are unintentional.    Patient: Leslie Carrillo  Service Category: E/M  Provider: Edward Jolly, MD  DOB: 1960/12/23  DOS: 07/24/2022  Referring Provider: Enid Baas, MD  MRN: [id number]  Specialty: Interventional Pain Management  PCP: Enid Baas, MD  Type: Established Patient  Setting: Ambulatory outpatient    Location: Office  Delivery: Face-to-face     HPI  [name], a 62 y.o. year old female, is here today because of her Lumbar facet arthropathy [M47.816]. [name] primary complain today is Back Pain (Radiates down to feet) Last encounter: My last encounter with her was on 05/03/2022. Pertinent problems: [name] has Chronic low back pain; DDD (degenerative disc disease), thoracic; Lumbar radiculopathy; Cervical facet joint syndrome; Bilateral primary osteoarthritis of knee; Lumbar degenerative disc disease; Chronic pain syndrome; Myofascial pain syndrome; and Lumbar spondylosis on their pertinent problem list. Pain Assessment: Severity of Chronic pain is reported as a 4 /10. Location: Back Right, Left, Lower/radiates down to feet. Onset: More than a month ago. Quality: Aching, Constant. Timing: Constant. Modifying factor(s): meds. Vitals:  height is 5\' 3"  (1.6 m) and weight is 180 lb (81.6 kg). Her temporal temperature is 97.3 F (36.3 C) (abnormal). Her blood pressure is 123/74 and her pulse is 69. Her oxygen saturation is 99%.  BMI: Estimated body mass index is 31.89 kg/m as calculated from the following:   Height as of this encounter: 5\' 3"  (1.6 m).   Weight  as of this encounter: 180 lb (81.6 kg).  Reason for encounter: both, medication management and post-procedure evaluation and assessment    Post-procedure evaluation   Procedure: Lumbar Facet, Medial Branch Block(s) #1  Laterality: Bilateral  Level: L3, L4, and L5 Medial Branch Level(s). Injecting these levels blocks the L3-4 and L4-5 lumbar facet joints. Imaging: Fluoroscopic guidance         Anesthesia: Local anesthesia (1-2% Lidocaine) DOS: 06/13/2022 Performed by: Edward Jolly, MD  Primary Purpose: Diagnostic/Therapeutic Indications: Low back pain severe enough to impact quality of life or function. 1. Lumbar facet arthropathy   2. Lumbar spondylosis   3. Chronic pain syndrome    NAS-11 Pain score:   Pre-procedure: 4 /10   Post-procedure: 4 /10      Effectiveness:  Initial hour after procedure: 95 %  Subsequent 4-6 hours post-procedure: 95 %  Analgesia past initial 6 hours: 75% Ongoing improvement:  Analgesic:  75% Function: [name] reports improvement in function ROM: [name] reports improvement in ROM   Pharmacotherapy Assessment  Analgesic: Buprenorphine transdermal patch, 15 mcg an hour, oxycodone 5 mg daily as needed for breakthrough pain, quantity #30 to last for 90 days MME/day: 10 mg/day   Monitoring: [place] PMP: PDMP reviewed during this encounter.       Pharmacotherapy: No side-effects or adverse reactions reported. Compliance: No problems identified. Effectiveness: Clinically acceptable.  Florina Ou, RN  07/24/2022 10:16 AM  Sign when Signing Visit Nursing Pain Medication Assessment:  Safety precautions to be maintained throughout the outpatient stay will include: orient to surroundings, keep bed in low position, maintain call bell within reach at all times, provide assistance with transfer out of bed and ambulation.  Medication  Inspection Compliance: Pill count conducted under aseptic conditions, in front of the patient. Neither the pills nor the  bottle was removed from the patient's sight at any time. Once count was completed pills were immediately returned to the patient in their original bottle.  Medication #1: Oxycodone IR Pill/Patch Count:  11 of 30 pills remain Pill/Patch Appearance: Markings consistent with prescribed medication Bottle Appearance: Standard pharmacy container. Clearly labeled. Filled Date: 02 / 15 / 2024 Last Medication intake:   3 days ago  Medication #2: Buprenorphine (Suboxone) Pill/Patch Count:  1 of 4 pills remain Pill/Patch Appearance: Markings consistent with prescribed medication Bottle Appearance: Standard pharmacy container. Clearly labeled. Filled Date: 04 / 14 / 2024 Last Medication intake:   5 days ago Safety precautions to be maintained throughout the outpatient stay will include: orient to surroundings, keep bed in low position, maintain call bell within reach at all times, provide assistance with transfer out of bed and ambulation.     No results found for: "CBDTHCR" No results found for: "D8THCCBX" No results found for: "D9THCCBX"  UDS:  Summary  Date Value Ref Range Status  02/06/2022 Note  Final    Comment:    ==================================================================== [employer] (MW) ==================================================================== Test                             Result       Flag       Units  Drug Present and Declared for Prescription Verification   Oxycodone                      279          EXPECTED   ng/mg creat   Oxymorphone                    167          EXPECTED   ng/mg creat   Noroxycodone                   650          EXPECTED   ng/mg creat   Noroxymorphone                 64           EXPECTED   ng/mg creat    Sources of oxycodone are scheduled prescription medications.    Oxymorphone, noroxycodone, and noroxymorphone are expected    metabolites of oxycodone. Oxymorphone is also available as a    scheduled prescription  medication.    Buprenorphine                  30           EXPECTED   ng/mg creat   Norbuprenorphine               39           EXPECTED   ng/mg creat    Source of buprenorphine is a scheduled prescription medication.    Norbuprenorphine is an expected metabolite of buprenorphine.  ==================================================================== Test                      Result    Flag   Units      Ref Range   Creatinine              107  mg/dL      >=16 ==================================================================== Declared Medications:  The flagging and interpretation on this report are based on the  following declared medications.  Unexpected results may arise from  inaccuracies in the declared medications.   **Note: The testing scope of this panel includes these medications:   Oxycodone (Roxicodone)   **Note: The testing scope of this panel does not include small to  moderate amounts of these reported medications:   Buprenorphine Patch (BuTrans)   **Note: The testing scope of this panel does not include the  following reported medications:   Acetaminophen (Tylenol)  Cholecalciferol  Clobetasol  Clonidine (Catapres)  Sertraline (Zoloft) ==================================================================== For clinical consultation, please call [phone]. ====================================================================       ROS  Constitutional: Denies any fever or chills Gastrointestinal: No reported hemesis, hematochezia, vomiting, or acute GI distress Musculoskeletal:  Axial low back pain, positive response after diagnostic lumbar medial branch nerve blocks   Medication Review  acetaminophen, buprenorphine, clobetasol, hydrOXYzine, hydrochlorothiazide, multivitamin, oxyCODONE, and sertraline  History Review  Allergy: [name] is allergic to codeine, pregabalin, hydrocodone-acetaminophen, tramadol, latex, lovastatin, and  simvastatin. Drug: [name]  reports no history of drug use. Alcohol:  reports no history of alcohol use. Tobacco:  reports that she has been smoking cigarettes. She has a 10.00 pack-year smoking history. She has never used smokeless tobacco. Social: [name]  reports that she has been smoking cigarettes. She has a 10.00 pack-year smoking history. She has never used smokeless tobacco. She reports that she does not drink alcohol and does not use drugs. Medical:  has a past medical history of Anemia, Arthritis, Asthma, Chronic kidney disease, Chronic pain, and History of kidney stones. Surgical: [name]  has a past surgical history that includes Cervical fusion (2014); carpel tunnel (Right); Cholecystectomy; c sections (N/A, 1985); Colonoscopy; and Shoulder arthroscopy with subacromial decompression, rotator cuff repair and bicep tendon repair (Right, 11/08/2020). Family: family history includes Dementia in her mother; Prostate cancer in her father; Renal Cyst in her father.  Laboratory Chemistry Profile   Renal Lab Results  Component Value Date   BUN 11 11/08/2020   CREATININE 0.97 11/08/2020   GFRAA >60 10/16/2019   GFRNONAA >60 11/08/2020    Hepatic Lab Results  Component Value Date   AST 20 10/16/2019   ALT 15 10/16/2019   ALBUMIN 4.0 10/16/2019   ALKPHOS 75 10/16/2019   LIPASE 29 10/16/2019    Electrolytes Lab Results  Component Value Date   NA 137 11/08/2020   K 3.9 11/08/2020   CL 102 11/08/2020   CALCIUM 9.0 11/08/2020    Bone No results found for: "VD25OH", "VD125OH2TOT", "WJ1914NW2", "NF6213YQ6", "25OHVITD1", "25OHVITD2", "25OHVITD3", "TESTOFREE", "TESTOSTERONE"  Inflammation (CRP: Acute Phase) (ESR: Chronic Phase) No results found for: "CRP", "ESRSEDRATE", "LATICACIDVEN"       Note: Above Lab results reviewed.  Recent Imaging Review  DG PAIN CLINIC C-ARM 1-60 MIN NO REPORT Fluoro was used, but no Radiologist interpretation will be provided.  Please refer to  "NOTES" tab for provider progress note. Note: Reviewed        Physical Exam  General appearance: Well nourished, well developed, and well hydrated. In no apparent acute distress Mental status: Alert, oriented x 3 (person, place, & time)       Respiratory: No evidence of acute respiratory distress Eyes: PERLA Vitals: BP 123/74 (BP Location: Right Arm, Patient Position: Sitting, Cuff Size: Normal)   Pulse 69   Temp (!) 97.3 F (36.3 C) (Temporal)  Ht 5\' 3"  (1.6 m)   Wt 180 lb (81.6 kg)   SpO2 99%   BMI 31.89 kg/m  BMI: Estimated body mass index is 31.89 kg/m as calculated from the following:   Height as of this encounter: 5\' 3"  (1.6 m).   Weight as of this encounter: 180 lb (81.6 kg). Ideal: Ideal body weight: 52.4 kg (115 lb 8.3 oz) Adjusted ideal body weight: 64.1 kg (141 lb 5 oz)  Lumbar Spine Area Exam  Skin & Axial Inspection: No masses, redness, or swelling Alignment: Symmetrical Functional ROM: Pain restricted ROM affecting both sides improved after treatment Stability: No instability detected Muscle Tone/Strength: Functionally intact. No obvious neuro-muscular anomalies detected. Sensory (Neurological): Musculoskeletal pain pattern bilaterally, left greater than right Palpation: Complains of area being tender to palpation       Provocative Tests: Hyperextension/rotation test: (+) bilaterally for facet joint pain.,  Significantly improved after treatment Lumbar quadrant test (Kemp's test): (+) bilaterally for facet joint pain.  Improved after treatment  Gait & Posture Assessment  Ambulation: Unassisted Gait: Relatively normal for age and body habitus Posture: WNL   Lower Extremity Exam    Side: Right lower extremity  Side: Left lower extremity  Stability: No instability observed          Stability: No instability observed          Skin & Extremity Inspection: Skin color, temperature, and hair growth are WNL. No peripheral edema or cyanosis. No masses, redness,  swelling, asymmetry, or associated skin lesions. No contractures.  Skin & Extremity Inspection: Skin color, temperature, and hair growth are WNL. No peripheral edema or cyanosis. No masses, redness, swelling, asymmetry, or associated skin lesions. No contractures.  Functional ROM: Unrestricted ROM                  Functional ROM: Unrestricted ROM                  Muscle Tone/Strength: Functionally intact. No obvious neuro-muscular anomalies detected.  Muscle Tone/Strength: Functionally intact. No obvious neuro-muscular anomalies detected.  Sensory (Neurological): Unimpaired        Sensory (Neurological): Unimpaired        DTR: Patellar: deferred today Achilles: deferred today Plantar: deferred today  DTR: Patellar: deferred today Achilles: deferred today Plantar: deferred today  Palpation: No palpable anomalies  Palpation: No palpable anomalies    Assessment   Diagnosis Status  1. Lumbar facet arthropathy   2. Lumbar spondylosis   3. 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 Chronic pain syndrome   5. Lumbar radiculopathy   6. Lumbar radicular pain    Responding Responding Controlled   Updated Problems: No problems updated.   Plan of Care  Repeat diagnostic lumbar facet medial branch nerve block #2 when pain returns.  Patient currently endorsing greater than 75% pain relief and improvement in range of motion after her first set.  Otherwise refill of medications as below.  She was also told in the past that she has a Baker's cyst.  I palpated her right popliteal fossa and did not appreciate 1.  I encouraged her to keep an eye on this and if things worsen, we could consider a right knee steroid injection.  Requested Prescriptions   Signed Prescriptions Disp Refills   buprenorphine (BUTRANS) 15 MCG/HR 4 patch 2    Sig: Place 1 patch onto the skin once a week. For chronic pain syndrome   oxyCODONE (OXY IR/ROXICODONE) 5 MG immediate release  tablet 30 tablet 0     Sig: Take 1 tablet (5 mg total) by mouth daily as needed for severe pain.     Orders:  No orders of the defined types were placed in this encounter.  Follow-up plan:   Return in about 14 weeks (around 10/30/2022) for Medication Management, in person.      Recent Visits Date Type Provider Dept  06/13/22 Procedure visit Edward Jolly, MD [facility]  05/17/22 Office Visit Edward Jolly, MD [facility]  05/03/22 Office Visit Edward Jolly, MD [facility]  Showing recent visits within past 90 days and meeting all other requirements Today's Visits Date Type Provider Dept  07/24/22 Office Visit Edward Jolly, MD [facility]  Showing today's visits and meeting all other requirements Future Appointments No visits were found meeting these conditions. Showing future appointments within next 90 days and meeting all other requirements  I discussed the assessment and treatment plan with the patient. The patient was provided an opportunity to ask questions and all were answered. The patient agreed with the plan and demonstrated an understanding of the instructions.  Patient advised to call back or seek an in-person evaluation if the symptoms or condition worsens.  Duration of encounter: .  Total time on encounter, as per AMA guidelines included both the face-to-face and non-face-to-face time personally spent by the physician and/or other qualified health care professional(s) on the day of the encounter (includes time in activities that require the physician or other qualified health care professional and does not include time in activities normally performed by clinical staff). Physician's time may include the following activities when performed: Preparing to see the patient (e.g., pre-charting review of records, searching for previously ordered imaging, lab work, and nerve conduction tests) Review of prior analgesic pharmacotherapies. Reviewing  PMP Interpreting ordered tests (e.g., lab work, imaging, nerve conduction tests) Performing post-procedure evaluations, including interpretation of diagnostic procedures Obtaining and/or reviewing separately obtained history Performing a medically appropriate examination and/or evaluation Counseling and educating the patient/family/caregiver Ordering medications, tests, or procedures Referring and communicating with other health care professionals (when not separately reported) Documenting clinical information in the electronic or other health record Independently interpreting results (not separately reported) and communicating results to the patient/ family/caregiver Care coordination (not separately reported)  Note by: Edward Jolly, MD Date: 07/24/2022; Time: 10:54 AM

## 2022-10-23 ENCOUNTER — Other Ambulatory Visit: Payer: Self-pay | Admitting: Student in an Organized Health Care Education/Training Program

## 2022-10-23 DIAGNOSIS — M5416 Radiculopathy, lumbar region: Secondary | ICD-10-CM

## 2022-10-23 DIAGNOSIS — M48061 Spinal stenosis, lumbar region without neurogenic claudication: Secondary | ICD-10-CM

## 2022-10-23 DIAGNOSIS — G894 Chronic pain syndrome: Secondary | ICD-10-CM

## 2022-10-25 ENCOUNTER — Encounter: Payer: Self-pay | Admitting: Student in an Organized Health Care Education/Training Program

## 2022-10-25 ENCOUNTER — Ambulatory Visit
Payer: Medicare Other | Attending: Student in an Organized Health Care Education/Training Program | Admitting: Student in an Organized Health Care Education/Training Program

## 2022-10-25 VITALS — BP 130/77 | HR 59 | Temp 97.2°F | Resp 16 | Ht 63.0 in | Wt 178.0 lb

## 2022-10-25 DIAGNOSIS — M47816 Spondylosis without myelopathy or radiculopathy, lumbar region: Secondary | ICD-10-CM | POA: Insufficient documentation

## 2022-10-25 DIAGNOSIS — G894 Chronic pain syndrome: Secondary | ICD-10-CM | POA: Insufficient documentation

## 2022-10-25 DIAGNOSIS — M5416 Radiculopathy, lumbar region: Secondary | ICD-10-CM | POA: Diagnosis present

## 2022-10-25 DIAGNOSIS — M48061 Spinal stenosis, lumbar region without neurogenic claudication: Secondary | ICD-10-CM | POA: Diagnosis present

## 2022-10-25 MED ORDER — BUPRENORPHINE 15 MCG/HR TD PTWK: 1.0000 | MEDICATED_PATCH | TRANSDERMAL | 2 refills | Status: DC

## 2022-10-25 MED ORDER — OXYCODONE HCL 5 MG PO CAPS: 5.0000 mg | ORAL_CAPSULE | Freq: Every day | ORAL | 0 refills | Status: AC | PRN

## 2022-10-25 NOTE — Progress Notes (Signed)
Nursing Pain Medication Assessment:  Safety precautions to be maintained throughout the outpatient stay will include: orient to surroundings, keep bed in low position, maintain call bell within reach at all times, provide assistance with transfer out of bed and ambulation.  Medication Inspection Compliance: Pill count conducted under aseptic conditions, in front of the patient. Neither the pills nor the bottle was removed from the patient's sight at any time. Once count was completed pills were immediately returned to the patient in their original bottle.  Medication #1: Oxycodone IR Pill/Patch Count:  2 of 30 pills remain Pill/Patch Appearance: Markings consistent with prescribed medication Bottle Appearance: Standard pharmacy container. Clearly labeled. Filled Date: 2 / 15 / 2024 Last Medication intake:  Today  Medication #2: Buprenorphine (Suboxone) Pill/Patch Count:  0 of 4 pills remain Pill/Patch Appearance: Markings consistent with prescribed medication Bottle Appearance: Standard pharmacy container. Clearly labeled. Filled Date: 7 / 37 / 2024 Last Medication intake:   10/22/22

## 2022-10-25 NOTE — Progress Notes (Signed)
PROVIDER NOTE: Information contained herein reflects review and annotations entered in association with encounter. Interpretation of such information and data should be left to medically-trained personnel. Information provided to patient can be located elsewhere in the medical record under "Patient Instructions". Document created using STT-dictation technology, any transcriptional errors that may result from process are unintentional.    Patient: Leslie Carrillo  Service Category: E/M  Provider: Edward Jolly, MD  DOB: Oct 08, 1960  DOS: 10/25/2022  Referring Provider: Enid Baas, MD  MRN: [id number]  Specialty: Interventional Pain Management  PCP: Enid Baas, MD  Type: Established Patient  Setting: Ambulatory outpatient    Location: Office  Delivery: Face-to-face     HPI  [name], a 62 y.o. year old female, is here today because of her Lumbar facet arthropathy [M47.816]. [name] primary complain today is Back Pain Last encounter: My last encounter with her was on 07/24/22 Pertinent problems: Ms. Vago has Chronic low back pain; DDD (degenerative disc disease), thoracic; Lumbar radiculopathy; Cervical facet joint syndrome; Bilateral primary osteoarthritis of knee; Lumbar degenerative disc disease; Chronic pain syndrome; Myofascial pain syndrome; and Lumbar spondylosis on their pertinent problem list. Pain Assessment: Severity of Chronic pain is reported as a 6 /10. Location: Back Lower/down legs to feet and toes, bilat. Onset: More than a month ago. Quality: Aching, Numbness. Timing: Constant. Modifying factor(s): meds. Vitals:  height is 5\' 3"  (1.6 m) and weight is 178 lb (80.7 kg). Her temperature is 97.2 F (36.2 C) (abnormal). Her blood pressure is 130/77 and her pulse is 59 (abnormal). Her respiration is 16 and oxygen saturation is 97%.  BMI: Estimated body mass index is 31.53 kg/m as calculated from the following:   Height as of this encounter: 5\' 3"  (1.6 m).   Weight as  of this encounter: 178 lb (80.7 kg).  Reason for encounter: medication management   Patient follows up today for medication management.  No significant change in her medical history.  She is starting to endorse increase in her low back pain.  She had a positive response to her diagnostic lumbar medial branch nerve block on 06/13/2022.  We have discussed repeating however the patient wants to monitor her symptoms for about a longer period I will place a as needed order as below should she want to return to repeat diagnostic lumbar medial branch nerve blocks #2.  We can consider lumbar RFA thereafter.  Pharmacotherapy Assessment  Analgesic: Buprenorphine transdermal patch, 15 mcg an hour, oxycodone 5 mg daily as needed for breakthrough pain, quantity #30 to last for 90 days MME/day: 10 mg/day   Monitoring: [place] PMP: PDMP reviewed during this encounter.       Pharmacotherapy: No side-effects or adverse reactions reported. Compliance: No problems identified. Effectiveness: Clinically acceptable.  Nonah Mattes, RN  10/25/2022 11:00 AM  Sign when Signing Visit Nursing Pain Medication Assessment:  Safety precautions to be maintained throughout the outpatient stay will include: orient to surroundings, keep bed in low position, maintain call bell within reach at all times, provide assistance with transfer out of bed and ambulation.  Medication Inspection Compliance: Pill count conducted under aseptic conditions, in front of the patient. Neither the pills nor the bottle was removed from the patient's sight at any time. Once count was completed pills were immediately returned to the patient in their original bottle.  Medication #1: Oxycodone IR Pill/Patch Count:  2 of 30 pills remain Pill/Patch Appearance: Markings consistent with prescribed medication Bottle Appearance: Standard pharmacy container. Clearly  labeled. Filled Date: 2 / 15 / 2024 Last Medication intake:  Today  Medication #2: Buprenorphine  (Suboxone) Pill/Patch Count:  0 of 4 pills remain Pill/Patch Appearance: Markings consistent with prescribed medication Bottle Appearance: Standard pharmacy container. Clearly labeled. Filled Date: 7 / 55 / 2024 Last Medication intake:   10/22/22  No results found for: "CBDTHCR" No results found for: "D8THCCBX" No results found for: "D9THCCBX"  UDS:  Summary  Date Value Ref Range Status  02/06/2022 Note  Final    Comment:    ==================================================================== [facility] (MW) ==================================================================== Test                             Result       Flag       Units  Drug Present and Declared for Prescription Verification   Oxycodone                      279          EXPECTED   ng/mg creat   Oxymorphone                    167          EXPECTED   ng/mg creat   Noroxycodone                   650          EXPECTED   ng/mg creat   Noroxymorphone                 64           EXPECTED   ng/mg creat    Sources of oxycodone are scheduled prescription medications.    Oxymorphone, noroxycodone, and noroxymorphone are expected    metabolites of oxycodone. Oxymorphone is also available as a    scheduled prescription medication.    Buprenorphine                  30           EXPECTED   ng/mg creat   Norbuprenorphine               39           EXPECTED   ng/mg creat    Source of buprenorphine is a scheduled prescription medication.    Norbuprenorphine is an expected metabolite of buprenorphine.  ==================================================================== Test                      Result    Flag   Units      Ref Range   Creatinine              107              mg/dL      >=40 ==================================================================== Declared Medications:  The flagging and interpretation on this report are based on the  following declared medications.  Unexpected results may arise from   inaccuracies in the declared medications.   **Note: The testing scope of this panel includes these medications:   Oxycodone (Roxicodone)   **Note: The testing scope of this panel does not include small to  moderate amounts of these reported medications:   Buprenorphine Patch (BuTrans)   **Note: The testing scope of this panel does not include the  following reported medications:   Acetaminophen (Tylenol)  Cholecalciferol  Clobetasol  Clonidine (Catapres)  Sertraline (Zoloft) ==================================================================== For clinical consultation, please call [phone]. ====================================================================       ROS  Constitutional: Denies any fever or chills Gastrointestinal: No reported hemesis, hematochezia, vomiting, or acute GI distress Musculoskeletal:  Axial low back pain   Medication Review  acetaminophen, buprenorphine, clobetasol, hydrOXYzine, hydrochlorothiazide, multivitamin, oxycodone, and sertraline  History Review  Allergy: Ms. Cozad is allergic to codeine, pregabalin, hydrocodone-acetaminophen, tramadol, latex, lovastatin, and simvastatin. Drug: Ms. Muenzer  reports no history of drug use. Alcohol:  reports no history of alcohol use. Tobacco:  reports that she has been smoking cigarettes. She has a 10 pack-year smoking history. She has never used smokeless tobacco. Social: Ms. Vassar  reports that she has been smoking cigarettes. She has a 10 pack-year smoking history. She has never used smokeless tobacco. She reports that she does not drink alcohol and does not use drugs. Medical:  has a past medical history of Anemia, Arthritis, Asthma, Chronic kidney disease, Chronic pain, and History of kidney stones. Surgical: Ms. Shope  has a past surgical history that includes Cervical fusion (2014); carpel tunnel (Right); Cholecystectomy; c sections (N/A, 1985); Colonoscopy; and Shoulder arthroscopy with  subacromial decompression, rotator cuff repair and bicep tendon repair (Right, 11/08/2020). Family: family history includes Dementia in her mother; Prostate cancer in her father; Renal Cyst in her father.  Laboratory Chemistry Profile   Renal Lab Results  Component Value Date   BUN 11 11/08/2020   CREATININE 0.97 11/08/2020   GFRAA >60 10/16/2019   GFRNONAA >60 11/08/2020    Hepatic Lab Results  Component Value Date   AST 20 10/16/2019   ALT 15 10/16/2019   ALBUMIN 4.0 10/16/2019   ALKPHOS 75 10/16/2019   LIPASE 29 10/16/2019    Electrolytes Lab Results  Component Value Date   NA 137 11/08/2020   K 3.9 11/08/2020   CL 102 11/08/2020   CALCIUM 9.0 11/08/2020    Bone No results found for: "VD25OH", "VD125OH2TOT", "MV7846NG2", "XB2841LK4", "25OHVITD1", "25OHVITD2", "25OHVITD3", "TESTOFREE", "TESTOSTERONE"  Inflammation (CRP: Acute Phase) (ESR: Chronic Phase) No results found for: "CRP", "ESRSEDRATE", "LATICACIDVEN"       Note: Above Lab results reviewed.  Recent Imaging Review  DG PAIN CLINIC C-ARM 1-60 MIN NO REPORT Fluoro was used, but no Radiologist interpretation will be provided.  Please refer to "NOTES" tab for provider progress note. Note: Reviewed        Physical Exam  General appearance: Well nourished, well developed, and well hydrated. In no apparent acute distress Mental status: Alert, oriented x 3 (person, place, & time)       Respiratory: No evidence of acute respiratory distress Eyes: PERLA Vitals: BP 130/77   Pulse (!) 59   Temp (!) 97.2 F (36.2 C)   Resp 16   Ht 5\' 3"  (1.6 m)   Wt 178 lb (80.7 kg)   SpO2 97%   BMI 31.53 kg/m  BMI: Estimated body mass index is 31.53 kg/m as calculated from the following:   Height as of this encounter: 5\' 3"  (1.6 m).   Weight as of this encounter: 178 lb (80.7 kg). Ideal: Ideal body weight: 52.4 kg (115 lb 8.3 oz) Adjusted ideal body weight: 63.7 kg (140 lb 8.2 oz)  Lumbar Spine Area Exam  Skin & Axial  Inspection: No masses, redness, or swelling Alignment: Symmetrical Functional ROM: Pain restricted ROM affecting both sides improved after treatment Stability: No instability detected Muscle Tone/Strength: Functionally intact. No obvious neuro-muscular anomalies detected. Sensory (Neurological): Musculoskeletal  pain pattern bilaterally, left greater than right Palpation: Complains of area being tender to palpation       Provocative Tests: Hyperextension/rotation test: (+) bilaterally for facet joint pain.,   Lumbar quadrant test (Kemp's test): (+) bilaterally for facet joint pain.  Gait & Posture Assessment  Ambulation: Unassisted Gait: Relatively normal for age and body habitus Posture: WNL   Lower Extremity Exam    Side: Right lower extremity  Side: Left lower extremity  Stability: No instability observed          Stability: No instability observed          Skin & Extremity Inspection: Skin color, temperature, and hair growth are WNL. No peripheral edema or cyanosis. No masses, redness, swelling, asymmetry, or associated skin lesions. No contractures.  Skin & Extremity Inspection: Skin color, temperature, and hair growth are WNL. No peripheral edema or cyanosis. No masses, redness, swelling, asymmetry, or associated skin lesions. No contractures.  Functional ROM: Unrestricted ROM                  Functional ROM: Unrestricted ROM                  Muscle Tone/Strength: Functionally intact. No obvious neuro-muscular anomalies detected.  Muscle Tone/Strength: Functionally intact. No obvious neuro-muscular anomalies detected.  Sensory (Neurological): Unimpaired        Sensory (Neurological): Unimpaired        DTR: Patellar: deferred today Achilles: deferred today Plantar: deferred today  DTR: Patellar: deferred today Achilles: deferred today Plantar: deferred today  Palpation: No palpable anomalies  Palpation: No palpable anomalies    Assessment   Diagnosis Status  1. Lumbar facet  arthropathy   2. Lumbar spondylosis   3. 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 Lumbar radiculopathy   5. Lumbar radicular pain   6. Chronic pain syndrome    Persistent Persistent Controlled   Updated Problems: No problems updated.   Plan of Care  Repeat diagnostic lumbar facet medial branch nerve block #2 PRN,  order placed below UDS UTD  Requested Prescriptions   Signed Prescriptions Disp Refills   buprenorphine (BUTRANS) 15 MCG/HR 4 patch 2    Sig: Place 1 patch onto the skin once a week. For chronic pain syndrome   oxycodone (OXY-IR) 5 MG capsule 30 capsule 0    Sig: Take 1 capsule (5 mg total) by mouth daily as needed.     Orders:  Orders Placed This Encounter  Procedures   LUMBAR FACET(MEDIAL BRANCH NERVE BLOCK) MBNB    Standing Status:   Standing    Number of Occurrences:   2    Standing Expiration Date:   04/27/2023    Scheduling Instructions:     Procedure: Lumbar facet block (AKA.: Lumbosacral medial branch nerve block)     Side: Bilateral     Level: L3-4, L4-5, Facets ( L3, L4, L5, Medial Branch)     Sedation: IV Versed     Timeframe: PRN    Order Specific Question:   Where will this procedure be performed?    Answer:   [facility]   Follow-up plan:   Return in about 3 months (around 01/29/2023) for Medication Management, in person.      Recent Visits No visits were found meeting these conditions. Showing recent visits within past 90 days and meeting all other requirements Today's Visits Date Type Provider Dept  10/25/22 Office Visit Edward Jolly, MD [facility]  Showing today's visits and meeting all other requirements Future Appointments Date Type Provider Dept  01/22/23 Appointment Edward Jolly, MD [facility]  Showing future appointments within next 90 days and meeting all other requirements  I discussed the assessment and treatment plan with the patient. The patient was provided an opportunity  to ask questions and all were answered. The patient agreed with the plan and demonstrated an understanding of the instructions.  Patient advised to call back or seek an in-person evaluation if the symptoms or condition worsens.  Duration of encounter: .  Total time on encounter, as per AMA guidelines included both the face-to-face and non-face-to-face time personally spent by the physician and/or other qualified health care professional(s) on the day of the encounter (includes time in activities that require the physician or other qualified health care professional and does not include time in activities normally performed by clinical staff). Physician's time may include the following activities when performed: Preparing to see the patient (e.g., pre-charting review of records, searching for previously ordered imaging, lab work, and nerve conduction tests) Review of prior analgesic pharmacotherapies. Reviewing PMP Interpreting ordered tests (e.g., lab work, imaging, nerve conduction tests) Performing post-procedure evaluations, including interpretation of diagnostic procedures Obtaining and/or reviewing separately obtained history Performing a medically appropriate examination and/or evaluation Counseling and educating the patient/family/caregiver Ordering medications, tests, or procedures Referring and communicating with other health care professionals (when not separately reported) Documenting clinical information in the electronic or other health record Independently interpreting results (not separately reported) and communicating results to the patient/ family/caregiver Care coordination (not separately reported)  Note by: Edward Jolly, MD Date: 10/25/2022; Time: 11:33 AM

## 2023-01-14 ENCOUNTER — Other Ambulatory Visit: Payer: Self-pay | Admitting: Student in an Organized Health Care Education/Training Program

## 2023-01-14 DIAGNOSIS — M5416 Radiculopathy, lumbar region: Secondary | ICD-10-CM

## 2023-01-14 DIAGNOSIS — G894 Chronic pain syndrome: Secondary | ICD-10-CM

## 2023-01-14 DIAGNOSIS — M48061 Spinal stenosis, lumbar region without neurogenic claudication: Secondary | ICD-10-CM

## 2023-01-22 ENCOUNTER — Ambulatory Visit
Payer: Medicare Other | Attending: Student in an Organized Health Care Education/Training Program | Admitting: Student in an Organized Health Care Education/Training Program

## 2023-01-22 ENCOUNTER — Encounter: Payer: Self-pay | Admitting: Student in an Organized Health Care Education/Training Program

## 2023-01-22 DIAGNOSIS — M48061 Spinal stenosis, lumbar region without neurogenic claudication: Secondary | ICD-10-CM | POA: Insufficient documentation

## 2023-01-22 DIAGNOSIS — G894 Chronic pain syndrome: Secondary | ICD-10-CM | POA: Diagnosis present

## 2023-01-22 DIAGNOSIS — M5416 Radiculopathy, lumbar region: Secondary | ICD-10-CM | POA: Diagnosis present

## 2023-01-22 MED ORDER — OXYCODONE HCL 5 MG PO TABS: 5.0000 mg | ORAL_TABLET | Freq: Every day | ORAL | 0 refills | Status: AC | PRN

## 2023-01-22 MED ORDER — BUPRENORPHINE 15 MCG/HR TD PTWK: 1.0000 | MEDICATED_PATCH | TRANSDERMAL | 4 refills | Status: DC

## 2023-01-22 NOTE — Progress Notes (Signed)
PROVIDER NOTE: Information contained herein reflects review and annotations entered in association with encounter. Interpretation of such information and data should be left to medically-trained personnel. Information provided to patient can be located elsewhere in the medical record under "Patient Instructions". Document created using STT-dictation technology, any transcriptional errors that may result from process are unintentional.    Patient: Leslie Carrillo  Service Category: E/M  Provider: Edward Jolly, MD  DOB: [birth year]  DOS: 01/22/2023  Referring Provider: Enid Baas, MD  MRN: [id number]  Specialty: Interventional Pain Management  PCP: Enid Baas, MD  Type: Established Patient  Setting: Ambulatory outpatient    Location: Office  Delivery: Face-to-face     HPI  Leslie Carrillo, a 62 y.o. year old female, is here today because of her No primary diagnosis found.. Leslie Carrillo primary complain today is Back Pain (lower) and Neck Pain (Down arms bilateral hands and finger go to sleep and numb.) Last encounter: My last encounter with her was on 10/25/22 Pertinent problems: [name] has Chronic low back pain; DDD (degenerative disc disease), thoracic; Lumbar radiculopathy; Cervical facet joint syndrome; Bilateral primary osteoarthritis of knee; Lumbar degenerative disc disease; Chronic pain syndrome; Myofascial pain syndrome; and Lumbar spondylosis on their pertinent problem list. Pain Assessment: Severity of   is reported as a 5 /10. Location: Back Right, Left, Lower/buttocks /hips bilateral,   down back of legs bilateral to ankles, left worse. Onset: More than a month ago. Quality: Aching, Constant, Numbness, Dull, Discomfort. Timing: Constant. Modifying factor(s): medication keeps me where I can function, rest, patches. Vitals:  height is 5\' 3"  (1.6 m) and weight is 177 lb (80.3 kg). Her temperature is 97.7 F (36.5 C). Her blood pressure is 122/60 and her pulse is 76. Her respiration  is 16 and oxygen saturation is 99%.  BMI: Estimated body mass index is 31.35 kg/m as calculated from the following:   Height as of this encounter: 5\' 3"  (1.6 m).   Weight as of this encounter: 177 lb (80.3 kg).  Reason for encounter: medication management   Patient follows up today for medication management.  No significant change in her medical history.   She is expressing some frustrations and concerns with her pharmacy.  She states that she would like to use [facility] once more to see a will dispense medications to her as scheduled and indicated on the prescription.  Otherwise she would like to transition to [facility]. Still getting good relief from her lumbar facet medial branch nerve blocks.  Future consideration would be to repeat nerve block #2 and then consider RFA. Renew urine toxicology screen today.  Pharmacotherapy Assessment  Analgesic: Buprenorphine transdermal patch, 15 mcg an hour, oxycodone 5 mg daily as needed for breakthrough pain, quantity #30 to last for 90 days MME/day: 10 mg/day   Monitoring: [place] PMP: PDMP reviewed during this encounter.       Pharmacotherapy: No side-effects or adverse reactions reported. Compliance: No problems identified. Effectiveness: Clinically acceptable.  Newman Pies, RN  01/22/2023 10:42 AM  Sign when Signing Visit Nursing Pain Medication Assessment:  Safety precautions to be maintained throughout the outpatient stay will include: orient to surroundings, keep bed in low position, maintain call bell within reach at all times, provide assistance with transfer out of bed and ambulation.  Medication Inspection Compliance: Pill count conducted under aseptic conditions, in front of the patient. Neither the pills nor the bottle was removed from the patient's sight at any time. Once count was completed  pills were immediately returned to the patient in their original bottle.  Medication:  Buprenorphine Patch Pill/Patch Count:  0 of 4 pills  remain Pill/Patch Appearance: Markings consistent with prescribed medication Bottle Appearance: Standard pharmacy container. Clearly labeled. Filled Date: 10 / 02 / 2024 Last Medication intake:  TodaySafety precautions to be maintained throughout the outpatient stay will include: orient to surroundings, keep bed in low position, maintain call bell within reach at all times, provide assistance with transfer out of bed and ambulation.   Patient has last  patch on but states it ran out yesterday.  Oxycodone IR 5mg  4/30 pills remain. Last took medication today.  No results found for: "CBDTHCR" No results found for: "D8THCCBX" No results found for: "D9THCCBX"  UDS:  Summary  Date Value Ref Range Status  02/06/2022 Note  Final    Comment:    ==================================================================== Tox[employer]SU[employer] Select 13 (MW) ==================================================================== Test                             Result       Flag       Units  Drug Present and Declared for Prescription Verification   Oxycodone                      279          EXPECTED   ng/mg creat   Oxymorphone                    167          EXPECTED   ng/mg creat   Noroxycodone                   650          EXPECTED   ng/mg creat   Noroxymorphone                 64           EXPECTED   ng/mg creat    Sources of oxycodone are scheduled prescription medications.    Oxymorphone, noroxycodone, and noroxymorphone are expected    metabolites of oxycodone. Oxymorphone is also available as a    scheduled prescription medication.    Buprenorphine                  30           EXPECTED   ng/mg creat   Norbuprenorphine               39           EXPECTED   ng/mg creat    Source of buprenorphine is a scheduled prescription medication.    Norbuprenorphine is an expected metabolite of buprenorphine.  ==================================================================== Test                      Result     Flag   Units      Ref Range   Creatinine              107              mg/dL      >=16 ==================================================================== Declared Medications:  The flagging and interpretation on this report are based on the  following declared medications.  Unexpected results may arise from  inaccuracies in the declared medications.   **Note: The testing scope of this panel includes  these medications:   Oxycodone (Roxicodone)   **Note: The testing scope of this panel does not include small to  moderate amounts of these reported medications:   Buprenorphine Patch (BuTrans)   **Note: The testing scope of this panel does not include the  following reported medications:   Acetaminophen (Tylenol)  Cholecalciferol  Clobetasol  Clonidine (Catapres)  Sertraline (Zoloft) ==================================================================== For clinical consultation, please call [phone]. ====================================================================       ROS  Constitutional: Denies any fever or chills Gastrointestinal: No reported hemesis, hematochezia, vomiting, or acute GI distress Musculoskeletal:  Axial low back pain   Medication Review  acetaminophen, buprenorphine, clobetasol, hydrOXYzine, hydrochlorothiazide, multivitamin, oxyCODONE, and sertraline  History Review  Allergy: Ms. Abramovich is allergic to codeine, pregabalin, hydrocodone-acetaminophen, tramadol, latex, lovastatin, and simvastatin. Drug: Ms. Pinckney  reports no history of drug use. Alcohol:  reports no history of alcohol use. Tobacco:  reports that she has been smoking cigarettes. She has a 10 pack-year smoking history. She has never used smokeless tobacco. Social: Ms. Marquess  reports that she has been smoking cigarettes. She has a 10 pack-year smoking history. She has never used smokeless tobacco. She reports that she does not drink alcohol and does not use drugs. Medical:  has a past  medical history of Anemia, Arthritis, Asthma, Chronic kidney disease, Chronic pain, and History of kidney stones. Surgical: Ms. Jagoda  has a past surgical history that includes Cervical fusion (2014); carpel tunnel (Right); Cholecystectomy; c sections (N/A, 1985); Colonoscopy; and Shoulder arthroscopy with subacromial decompression, rotator cuff repair and bicep tendon repair (Right, 11/08/2020). Family: family history includes Dementia in her mother; Prostate cancer in her father; Renal Cyst in her father.  Laboratory Chemistry Profile   Renal Lab Results  Component Value Date   BUN 11 11/08/2020   CREATININE 0.97 11/08/2020   GFRAA >60 10/16/2019   GFRNONAA >60 11/08/2020    Hepatic Lab Results  Component Value Date   AST 20 10/16/2019   ALT 15 10/16/2019   ALBUMIN 4.0 10/16/2019   ALKPHOS 75 10/16/2019   LIPASE 29 10/16/2019    Electrolytes Lab Results  Component Value Date   NA 137 11/08/2020   K 3.9 11/08/2020   CL 102 11/08/2020   CALCIUM 9.0 11/08/2020    Bone No results found for: "VD25OH", "VD125OH2TOT", "GN5621HY8", "MV7846NG2", "25OHVITD1", "25OHVITD2", "25OHVITD3", "TESTOFREE", "TESTOSTERONE"  Inflammation (CRP: Acute Phase) (ESR: Chronic Phase) No results found for: "CRP", "ESRSEDRATE", "LATICACIDVEN"       Note: Above Lab results reviewed.  Recent Imaging Review  DG PAIN CLINIC C-ARM 1-60 MIN NO REPORT Fluoro was used, but no Radiologist interpretation will be provided.  Please refer to "NOTES" tab for provider progress note. Note: Reviewed        Physical Exam  General appearance: Well nourished, well developed, and well hydrated. In no apparent acute distress Mental status: Alert, oriented x 3 (person, place, & time)       Respiratory: No evidence of acute respiratory distress Eyes: PERLA Vitals: BP 122/60   Pulse 76   Temp 97.7 F (36.5 C)   Resp 16   Ht 5\' 3"  (1.6 m)   Wt 177 lb (80.3 kg)   SpO2 99%   BMI 31.35 kg/m  BMI: Estimated body mass  index is 31.35 kg/m as calculated from the following:   Height as of this encounter: 5\' 3"  (1.6 m).   Weight as of this encounter: 177 lb (80.3 kg). Ideal: Ideal body weight: 52.4 kg (115 lb  8.3 oz) Adjusted ideal body weight: 63.6 kg (140 lb 1.8 oz)  Lumbar Spine Area Exam  Skin & Axial Inspection: No masses, redness, or swelling Alignment: Symmetrical Functional ROM: Pain restricted ROM affecting both sides improved after treatment Stability: No instability detected Muscle Tone/Strength: Functionally intact. No obvious neuro-muscular anomalies detected. Sensory (Neurological): Musculoskeletal pain pattern bilaterally, left greater than right Palpation: Complains of area being tender to palpation       Provocative Tests: Hyperextension/rotation test: (+) bilaterally for facet joint pain.,   Lumbar quadrant test (Kemp's test): (+) bilaterally for facet joint pain.  Gait & Posture Assessment  Ambulation: Unassisted Gait: Relatively normal for age and body habitus Posture: WNL   Lower Extremity Exam    Side: Right lower extremity  Side: Left lower extremity  Stability: No instability observed          Stability: No instability observed          Skin & Extremity Inspection: Skin color, temperature, and hair growth are WNL. No peripheral edema or cyanosis. No masses, redness, swelling, asymmetry, or associated skin lesions. No contractures.  Skin & Extremity Inspection: Skin color, temperature, and hair growth are WNL. No peripheral edema or cyanosis. No masses, redness, swelling, asymmetry, or associated skin lesions. No contractures.  Functional ROM: Unrestricted ROM                  Functional ROM: Unrestricted ROM                  Muscle Tone/Strength: Functionally intact. No obvious neuro-muscular anomalies detected.  Muscle Tone/Strength: Functionally intact. No obvious neuro-muscular anomalies detected.  Sensory (Neurological): Unimpaired        Sensory (Neurological): Unimpaired         DTR: Patellar: deferred today Achilles: deferred today Plantar: deferred today  DTR: Patellar: deferred today Achilles: deferred today Plantar: deferred today  Palpation: No palpable anomalies  Palpation: No palpable anomalies    Assessment   Diagnosis Status  1. 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 Lumbar radiculopathy   3. Lumbar radicular pain   4. Chronic pain syndrome    Controlled Controlled Controlled   Updated Problems: No problems updated.   Plan of Care  Repeat diagnostic lumbar facet medial branch nerve block #2 PRN, renew UDS  Requested Prescriptions   Signed Prescriptions Disp Refills   buprenorphine (BUTRANS) 15 MCG/HR 4 patch 4    Sig: Place 1 patch onto the skin once a week. For chronic pain syndrome   oxyCODONE (OXY IR/ROXICODONE) 5 MG immediate release tablet 30 tablet 0    Sig: Take 1 tablet (5 mg total) by mouth daily as needed for severe pain (pain score 7-10).     Orders:  Orders Placed This Encounter  Procedures   ToxASSURE Select 13 (MW), Urine    Volume: 30 ml(s). Minimum 3 ml of urine is needed. Document temperature of fresh sample. Indications: Long term (current) use of opiate analgesic (U44.034)    Order Specific Question:   Release to patient    Answer:   Immediate   Follow-up plan:   Return in about 4 months (around 05/22/2023) for MM, F2F.      Recent Visits Date Type Provider Dept  10/25/22 Office Visit Edward Jolly, MD [facility]  Showing recent visits within past 90 days and meeting all other requirements Today's Visits Date Type Provider Dept  01/22/23 Office Visit Edward Jolly, MD [facility]  Showing  today's visits and meeting all other requirements Future Appointments No visits were found meeting these conditions. Showing future appointments within next 90 days and meeting all other requirements  I discussed the assessment and treatment plan with the patient. The  patient was provided an opportunity to ask questions and all were answered. The patient agreed with the plan and demonstrated an understanding of the instructions.  Patient advised to call back or seek an in-person evaluation if the symptoms or condition worsens.  Duration of encounter: .  Total time on encounter, as per AMA guidelines included both the face-to-face and non-face-to-face time personally spent by the physician and/or other qualified health care professional(s) on the day of the encounter (includes time in activities that require the physician or other qualified health care professional and does not include time in activities normally performed by clinical staff). Physician's time may include the following activities when performed: Preparing to see the patient (e.g., pre-charting review of records, searching for previously ordered imaging, lab work, and nerve conduction tests) Review of prior analgesic pharmacotherapies. Reviewing PMP Interpreting ordered tests (e.g., lab work, imaging, nerve conduction tests) Performing post-procedure evaluations, including interpretation of diagnostic procedures Obtaining and/or reviewing separately obtained history Performing a medically appropriate examination and/or evaluation Counseling and educating the patient/family/caregiver Ordering medications, tests, or procedures Referring and communicating with other health care professionals (when not separately reported) Documenting clinical information in the electronic or other health record Independently interpreting results (not separately reported) and communicating results to the patient/ family/caregiver Care coordination (not separately reported)  Note by: Edward Jolly, MD Date: 01/22/2023; Time: 11:02 AM

## 2023-01-22 NOTE — Patient Instructions (Signed)

## 2023-01-22 NOTE — Progress Notes (Signed)
Nursing Pain Medication Assessment:  Safety precautions to be maintained throughout the outpatient stay will include: orient to surroundings, keep bed in low position, maintain call bell within reach at all times, provide assistance with transfer out of bed and ambulation.  Medication Inspection Compliance: Pill count conducted under aseptic conditions, in front of the patient. Neither the pills nor the bottle was removed from the patient's sight at any time. Once count was completed pills were immediately returned to the patient in their original bottle.  Medication:  Buprenorphine Patch Pill/Patch Count:  0 of 4 pills remain Pill/Patch Appearance: Markings consistent with prescribed medication Bottle Appearance: Standard pharmacy container. Clearly labeled. Filled Date: 10 / 02 / 2024 Last Medication intake:  TodaySafety precautions to be maintained throughout the outpatient stay will include: orient to surroundings, keep bed in low position, maintain call bell within reach at all times, provide assistance with transfer out of bed and ambulation.   Patient has last  patch on but states it ran out yesterday.  Oxycodone IR 5mg  4/30 pills remain. Last took medication today.

## 2023-01-25 LAB — TOXASSURE SELECT 13 (MW), URINE

## 2023-05-09 ENCOUNTER — Ambulatory Visit
Payer: Medicare Other | Attending: Student in an Organized Health Care Education/Training Program | Admitting: Student in an Organized Health Care Education/Training Program

## 2023-05-09 ENCOUNTER — Encounter: Payer: Self-pay | Admitting: Student in an Organized Health Care Education/Training Program

## 2023-05-09 VITALS — BP 130/73 | HR 67 | Temp 97.7°F | Resp 16 | Ht 63.0 in | Wt 178.0 lb

## 2023-05-09 DIAGNOSIS — G894 Chronic pain syndrome: Secondary | ICD-10-CM | POA: Diagnosis present

## 2023-05-09 DIAGNOSIS — M47816 Spondylosis without myelopathy or radiculopathy, lumbar region: Secondary | ICD-10-CM | POA: Insufficient documentation

## 2023-05-09 DIAGNOSIS — M5416 Radiculopathy, lumbar region: Secondary | ICD-10-CM | POA: Diagnosis present

## 2023-05-09 DIAGNOSIS — M48061 Spinal stenosis, lumbar region without neurogenic claudication: Secondary | ICD-10-CM | POA: Insufficient documentation

## 2023-05-09 MED ORDER — OXYCODONE HCL 5 MG PO TABS: 5.0000 mg | ORAL_TABLET | Freq: Every day | ORAL | 0 refills | Status: AC | PRN

## 2023-05-09 MED ORDER — BUPRENORPHINE 15 MCG/HR TD PTWK: 1.0000 | MEDICATED_PATCH | TRANSDERMAL | 2 refills | Status: DC

## 2023-05-09 NOTE — Progress Notes (Signed)
 PROVIDER NOTE: Information contained herein reflects review and annotations entered in association with encounter. Interpretation of such information and data should be left to medically-trained personnel. Information provided to patient can be located elsewhere in the medical record under "Patient Instructions". Document created using STT-dictation technology, any transcriptional errors that may result from process are unintentional.    Patient: Leslie Carrillo  Service Category: E/M  Provider: Edward Jolly, MD  DOB: 11-15-1960  DOS: 05/09/2023  Referring Provider: Enid Baas, MD  MRN: [id number]  Specialty: Interventional Pain Management  PCP: Enid Baas, MD  Type: Established Patient  Setting: Ambulatory outpatient    Location: Office  Delivery: Face-to-face     HPI  Leslie Carrillo, a 63 y.o. 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 Leslie Carrillo primary complain today is Back Pain (Lumbar midline ), Leg Pain (Left upper anterior leg pain.  ? Blood clot that is trying to resolve but there is still an obvious edematous knot ), and Neck Pain (Left is worse, hardware present. S/p fusion ) Last encounter: My last encounter with her was on 01/22/23 Pertinent problems: [name] has Chronic low back pain; DDD (degenerative disc disease), thoracic; Lumbar radiculopathy; Cervical facet joint syndrome; Bilateral primary osteoarthritis of knee; Lumbar degenerative disc disease; Chronic pain syndrome; Myofascial pain syndrome; and Lumbar spondylosis on their pertinent problem list. Pain Assessment: Severity of Chronic pain is reported as a 10-Worst pain ever/10. Location: Back (left neck and left upper leg) Lower, Mid/denies. Onset: More than a month ago. Quality: Discomfort, Constant, Aching, Other (Comment) (feels like shoulders and neck are popping all the time.). Timing: Constant. Modifying factor(s): medications. Vitals:  height is 5\' 3"   (1.6 m) and weight is 178 lb (80.7 kg). Her temporal temperature is 97.7 F (36.5 C). Her blood pressure is 130/73 and her pulse is 67. Her respiration is 16 and oxygen saturation is 98%.  BMI: Estimated body mass index is 31.53 kg/m as calculated from the following:   Height as of this encounter: 5\' 3"  (1.6 m).   Weight as of this encounter: 178 lb (80.7 kg).  Reason for encounter: medication management   Discussed the use of AI scribe software for clinical note transcription with the patient, who gave verbal consent to proceed.  History of Present Illness   Leslie Carrillo is a 62 year old female who presents with ongoing back and neck pain, and concerns about a possible DVT.  She experiences ongoing back and neck pain, primarily on the left side, which significantly affects her sleep. She often sleeps only two hours at a time and then sleeps for over twelve hours when she finally rests. She uses oxycodone, having taken a dose at 4 AM due to pain, and is also on a Butrans patch and Zoloft.  She describes a possible deep vein thrombosis (DVT) that she suspects occurred around Christmas. She experienced severe pain and swelling in her leg, describing it as feeling 'black and blue' and deeply bruised, although there was no visible bruising. She notes a persistent knot in the area and visible veins. She recalls a previous ultrasound and consultation with a vein specialist in [place].  She reports extreme fluid retention and swelling in her legs, which she finds difficult to resolve. Her father had issues with his veins, which ultimately led to his death from bleeding.  She fell down steps a couple of weeks ago, resulting in a significant bruise and  swelling on her left knee, which is still healing. The knee pain is described as the worst, though it is improving.  She experiences cramps in her legs, sometimes waking her up, and notes difficulty driving due to leg pain, requiring her to lift her  leg slowly when exiting the car.       Pharmacotherapy Assessment  Analgesic: Buprenorphine transdermal patch, 15 mcg an hour, oxycodone 5 mg daily as needed for breakthrough pain, quantity #30 to last for 90 days MME/day: 10 mg/day   Monitoring: [place] PMP: PDMP reviewed during this encounter.       Pharmacotherapy: No side-effects or adverse reactions reported. Compliance: No problems identified. Effectiveness: Clinically acceptable.  Vernie Ammons, RN  05/09/2023 11:10 AM  Sign when Signing Visit Nursing Pain Medication Assessment:  Safety precautions to be maintained throughout the outpatient stay will include: orient to surroundings, keep bed in low position, maintain call bell within reach at all times, provide assistance with transfer out of bed and ambulation.  Medication Inspection Compliance: Pill count conducted under aseptic conditions, in front of the patient. Neither the pills nor the bottle was removed from the patient's sight at any time. Once count was completed pills were immediately returned to the patient in their original bottle.  Medication #1: Oxycodone IR Pill/Patch Count:  5 of 30 pills remain Pill/Patch Appearance: Markings consistent with prescribed medication Bottle Appearance: Standard pharmacy container. Clearly labeled. Filled Date: 44 / 05 / 2024 Last Medication intake:  Today  Medication #2: Buprenorphine (Suboxone) Pill/Patch Count:  0 of 4 patches remain Pill/Patch Appearance: Markings consistent with prescribed medication Bottle Appearance: Standard pharmacy container. Clearly labeled. Filled Date: 01 / 28 / 2024 Last Medication intake:   patch placed today  No results found for: "CBDTHCR" No results found for: "D8THCCBX" No results found for: "D9THCCBX"  UDS:  Summary  Date Value Ref Range Status  01/22/2023 Note  Final    Comment:    ==================================================================== [facility]  (MW) ==================================================================== Test                             Result       Flag       Units  Drug Present and Declared for Prescription Verification   Oxycodone                      1215         EXPECTED   ng/mg creat   Oxymorphone                    408          EXPECTED   ng/mg creat   Noroxycodone                   1788         EXPECTED   ng/mg creat   Noroxymorphone                 101          EXPECTED   ng/mg creat    Sources of oxycodone are scheduled prescription medications.    Oxymorphone, noroxycodone, and noroxymorphone are expected    metabolites of oxycodone. Oxymorphone is also available as a    scheduled prescription medication.    Buprenorphine                  14  EXPECTED   ng/mg creat   Norbuprenorphine               52           EXPECTED   ng/mg creat    Source of buprenorphine is a scheduled prescription medication.    Norbuprenorphine is an expected metabolite of buprenorphine.  ==================================================================== Test                      Result    Flag   Units      Ref Range   Creatinine              101              mg/dL      >=01 ==================================================================== Declared Medications:  The flagging and interpretation on this report are based on the  following declared medications.  Unexpected results may arise from  inaccuracies in the declared medications.   **Note: The testing scope of this panel includes these medications:   Oxycodone (Roxicodone)   **Note: The testing scope of this panel does not include small to  moderate amounts of these reported medications:   Buprenorphine Patch (BuTrans)   **Note: The testing scope of this panel does not include the  following reported medications:   Acetaminophen (Tylenol)  Clobetasol (Temovate)  Hydrochlorothiazide (Hydrodiuril)  Hydroxyzine (Atarax)  Multivitamin  Sertraline  (Zoloft) ==================================================================== For clinical consultation, please call [phone]. ====================================================================       ROS  Constitutional: Denies any fever or chills Gastrointestinal: No reported hemesis, hematochezia, vomiting, or acute GI distress Musculoskeletal:  Axial low back pain   Medication Review  acetaminophen, buprenorphine, clobetasol, hydrOXYzine, hydrochlorothiazide, multivitamin, oxyCODONE, and sertraline  History Review  Allergy: Leslie Carrillo is allergic to codeine, pregabalin, hydrocodone-acetaminophen, tramadol, latex, lovastatin, and simvastatin. Drug: Leslie Carrillo  reports no history of drug use. Alcohol:  reports no history of alcohol use. Tobacco:  reports that she has been smoking cigarettes. She has a 10 pack-year smoking history. She has never used smokeless tobacco. Social: Leslie Carrillo  reports that she has been smoking cigarettes. She has a 10 pack-year smoking history. She has never used smokeless tobacco. She reports that she does not drink alcohol and does not use drugs. Medical:  has a past medical history of Anemia, Arthritis, Asthma, Chronic kidney disease, Chronic pain, and History of kidney stones. Surgical: Leslie Carrillo  has a past surgical history that includes Cervical fusion (2014); carpel tunnel (Right); Cholecystectomy; c sections (N/A, 1985); Colonoscopy; and Shoulder arthroscopy with subacromial decompression, rotator cuff repair and bicep tendon repair (Right, 11/08/2020). Family: family history includes Dementia in her mother; Prostate cancer in her father; Renal Cyst in her father.  Laboratory Chemistry Profile   Renal Lab Results  Component Value Date   BUN 11 11/08/2020   CREATININE 0.97 11/08/2020   GFRAA >60 10/16/2019   GFRNONAA >60 11/08/2020    Hepatic Lab Results  Component Value Date   AST 20 10/16/2019   ALT 15 10/16/2019   ALBUMIN 4.0  10/16/2019   ALKPHOS 75 10/16/2019   LIPASE 29 10/16/2019    Electrolytes Lab Results  Component Value Date   NA 137 11/08/2020   K 3.9 11/08/2020   CL 102 11/08/2020   CALCIUM 9.0 11/08/2020    Bone No results found for: "VD25OH", "VD125OH2TOT", "IH4742VZ5", "GL8756EP3", "25OHVITD1", "25OHVITD2", "25OHVITD3", "TESTOFREE", "TESTOSTERONE"  Inflammation (CRP: Acute Phase) (ESR: Chronic Phase) No results found for: "CRP", "ESRSEDRATE", "LATICACIDVEN"  Note: Above Lab results reviewed.  Recent Imaging Review  DG PAIN CLINIC C-ARM 1-60 MIN NO REPORT Fluoro was used, but no Radiologist interpretation will be provided.  Please refer to "NOTES" tab for provider progress note. Note: Reviewed        Physical Exam  General appearance: Well nourished, well developed, and well hydrated. In no apparent acute distress Mental status: Alert, oriented x 3 (person, place, & time)       Respiratory: No evidence of acute respiratory distress Eyes: PERLA Vitals: BP 130/73 (BP Location: Right Arm, Patient Position: Sitting, Cuff Size: Normal)   Pulse 67   Temp 97.7 F (36.5 C) (Temporal)   Resp 16   Ht 5\' 3"  (1.6 m)   Wt 178 lb (80.7 kg)   SpO2 98%   BMI 31.53 kg/m  BMI: Estimated body mass index is 31.53 kg/m as calculated from the following:   Height as of this encounter: 5\' 3"  (1.6 m).   Weight as of this encounter: 178 lb (80.7 kg). Ideal: Ideal body weight: 52.4 kg (115 lb 8.3 oz) Adjusted ideal body weight: 63.7 kg (140 lb 8.2 oz)  Lumbar Spine Area Exam  Skin & Axial Inspection: No masses, redness, or swelling Alignment: Symmetrical Functional ROM: Pain restricted ROM affecting both sides improved after treatment Stability: No instability detected Muscle Tone/Strength: Functionally intact. No obvious neuro-muscular anomalies detected. Sensory (Neurological): Musculoskeletal pain pattern bilaterally, left greater than right Palpation: Complains of area being tender to  palpation       Provocative Tests: Hyperextension/rotation test: (+) bilaterally for facet joint pain.,   Lumbar quadrant test (Kemp's test): (+) bilaterally for facet joint pain.  Gait & Posture Assessment  Ambulation: Unassisted Gait: Relatively normal for age and body habitus Posture: WNL   Lower Extremity Exam    Side: Right lower extremity  Side: Left lower extremity  Stability: No instability observed          Stability: No instability observed          Skin & Extremity Inspection: Skin color, temperature, and hair growth are WNL. No peripheral edema or cyanosis. No masses, redness, swelling, asymmetry, or associated skin lesions. No contractures.  Skin & Extremity Inspection: Skin color, temperature, and hair growth are WNL. No peripheral edema or cyanosis. No masses, redness, swelling, asymmetry, or associated skin lesions. No contractures.  Functional ROM: Unrestricted ROM                  Functional ROM: Unrestricted ROM                  Muscle Tone/Strength: Functionally intact. No obvious neuro-muscular anomalies detected.  Muscle Tone/Strength: Functionally intact. No obvious neuro-muscular anomalies detected.  Sensory (Neurological): Unimpaired        Sensory (Neurological): Unimpaired        DTR: Patellar: deferred today Achilles: deferred today Plantar: deferred today  DTR: Patellar: deferred today Achilles: deferred today Plantar: deferred today  Palpation: No palpable anomalies  Palpation: No palpable anomalies    Assessment   Diagnosis Status  1. 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 Lumbar radiculopathy   3. Lumbar radicular pain   4. Lumbar facet arthropathy   5. Lumbar spondylosis   6. Chronic pain syndrome    Controlled Controlled Controlled     Plan of Care   Assessment and Plan    Chronic Pain (Back and Neck) Chronic left-sided back and neck pain causes significant discomfort, disrupts  sleep, and impairs daily activities.  Currently managed with oxycodone. Discussed opioid risks, including dependency and tolerance, and benefits such as pain relief and improved function. Alternatives include physical therapy and non-opioid pain management. Refill oxycodone and Butrans patch.  Left Knee Injury Post-fall left knee injury shows improvement, though some swelling persists. Discussed potential complications like infection and prolonged pain, emphasizing the benefits of monitoring. Immediate imaging is advised if symptoms worsen. Monitor healing progress and recommend follow-up if symptoms persist or worsen.  Suspected Deep Vein Thrombosis (DVT) Painful, swollen leg with a persistent knot and visible veins since Christmas. Previous similar symptoms were evaluated by a vein specialist. Discussed risks of untreated DVT, such as pulmonary embolism, and the benefits of further evaluation. Recommend follow-up with primary care physician for possible ultrasound and blood work.  Leg Edema Severe leg edema with fluid retention. Discussed risks of underlying conditions like heart failure and venous insufficiency, and the benefits of further evaluation. Suggested lifestyle modifications, including reduced salt intake. Recommend discussing with primary care physician for further evaluation and possible blood tests.  Insomnia Severe sleep disruption, often limited to two hours, occasionally extending to over twelve hours. Discussed risks of chronic sleep deprivation, including impaired cognitive function and mood disturbances, and the benefits of addressing sleep issues. Alternatives include sleep hygiene and potential pharmacological interventions. Monitor sleep patterns and consider discussing with primary care physician for further evaluation and management.       Repeat diagnostic lumbar facet medial branch nerve block #2 PRN Consider gel injection or GNB for knee pain  Requested Prescriptions   Signed Prescriptions Disp Refills    oxyCODONE (OXY IR/ROXICODONE) 5 MG immediate release tablet 30 tablet 0    Sig: Take 1 tablet (5 mg total) by mouth daily as needed for severe pain (pain score 7-10). Must last 30 days.   buprenorphine (BUTRANS) 15 MCG/HR 4 patch 2    Sig: Place 1 patch onto the skin once a week. For chronic pain syndrome     Orders:  No orders of the defined types were placed in this encounter.  Follow-up plan:   Return in about 3 months (around 08/06/2023) for MM, F2F.      Recent Visits No visits were found meeting these conditions. Showing recent visits within past 90 days and meeting all other requirements Today's Visits Date Type Provider Dept  05/09/23 Office Visit Edward Jolly, MD [facility]  Showing today's visits and meeting all other requirements Future Appointments Date Type Provider Dept  08/01/23 Appointment Edward Jolly, MD [facility]  Showing future appointments within next 90 days and meeting all other requirements  I discussed the assessment and treatment plan with the patient. The patient was provided an opportunity to ask questions and all were answered. The patient agreed with the plan and demonstrated an understanding of the instructions.  Patient advised to call back or seek an in-person evaluation if the symptoms or condition worsens.  Duration of encounter: .  Total time on encounter, as per AMA guidelines included both the face-to-face and non-face-to-face time personally spent by the physician and/or other qualified health care professional(s) on the day of the encounter (includes time in activities that require the physician or other qualified health care professional and does not include time in activities normally performed by clinical staff). Physician's time may include the following activities when performed: Preparing to see the patient (e.g., pre-charting review of records, searching for previously ordered imaging, lab work, and  nerve conduction  tests) Review of prior analgesic pharmacotherapies. Reviewing PMP Interpreting ordered tests (e.g., lab work, imaging, nerve conduction tests) Performing post-procedure evaluations, including interpretation of diagnostic procedures Obtaining and/or reviewing separately obtained history Performing a medically appropriate examination and/or evaluation Counseling and educating the patient/family/caregiver Ordering medications, tests, or procedures Referring and communicating with other health care professionals (when not separately reported) Documenting clinical information in the electronic or other health record Independently interpreting results (not separately reported) and communicating results to the patient/ family/caregiver Care coordination (not separately reported)  Note by: Edward Jolly, MD Date: 05/09/2023; Time: 11:53 AM

## 2023-05-09 NOTE — Progress Notes (Signed)
 Nursing Pain Medication Assessment:  Safety precautions to be maintained throughout the outpatient stay will include: orient to surroundings, keep bed in low position, maintain call bell within reach at all times, provide assistance with transfer out of bed and ambulation.  Medication Inspection Compliance: Pill count conducted under aseptic conditions, in front of the patient. Neither the pills nor the bottle was removed from the patient's sight at any time. Once count was completed pills were immediately returned to the patient in their original bottle.  Medication #1: Oxycodone IR Pill/Patch Count:  5 of 30 pills remain Pill/Patch Appearance: Markings consistent with prescribed medication Bottle Appearance: Standard pharmacy container. Clearly labeled. Filled Date: 24 / 05 / 2024 Last Medication intake:  Today  Medication #2: Buprenorphine (Suboxone) Pill/Patch Count:  0 of 4 patches remain Pill/Patch Appearance: Markings consistent with prescribed medication Bottle Appearance: Standard pharmacy container. Clearly labeled. Filled Date: 01 / 28 / 2024 Last Medication intake:   patch placed today

## 2023-05-16 ENCOUNTER — Encounter: Payer: Medicare Other | Admitting: Student in an Organized Health Care Education/Training Program

## 2023-06-10 ENCOUNTER — Other Ambulatory Visit: Payer: Self-pay | Admitting: Internal Medicine

## 2023-06-10 DIAGNOSIS — M7989 Other specified soft tissue disorders: Secondary | ICD-10-CM

## 2023-06-19 ENCOUNTER — Ambulatory Visit
Admission: RE | Admit: 2023-06-19 | Discharge: 2023-06-19 | Disposition: A | Discharge status: Home / Self Care | Source: Ambulatory Visit | Attending: Internal Medicine | Admitting: Internal Medicine

## 2023-06-19 DIAGNOSIS — M7989 Other specified soft tissue disorders: Secondary | ICD-10-CM | POA: Diagnosis present

## 2023-07-31 NOTE — Progress Notes (Unsigned)
 PROVIDER NOTE: Interpretation of information contained herein should be left to medically-trained personnel. Specific patient instructions are provided elsewhere under "Patient Instructions" section of medical record. This document was created in part using AI and STT-dictation technology, any transcriptional errors that may result from this process are unintentional.  Patient: Leslie Carrillo  Service: E/M   PCP: Rex Castor, MD  DOB: 11-05-1960  DOS: 08/01/2023  Provider: Cherylin Corrigan, NP  MRN: [id number]  Delivery: Face-to-face  Specialty: Interventional Pain Management  Type: Established Patient  Setting: Ambulatory outpatient facility  Specialty designation: 09  Referring Prov.: Rex Castor, MD  Location: Outpatient office facility       HPI  [name], a 63 y.o. year old female, is here today because of her No primary diagnosis found.. [name] primary complain today is No chief complaint on file.   Pain Assessment: Severity of   is reported as a  /10. Location:    / . Onset:  . Quality:  . Timing:  . Modifying factor(s):  Aaron Aas Vitals:  vitals were not taken for this visit.  BMI: Estimated body mass index is 31.53 kg/m as calculated from the following:   Height as of 05/09/23: 5\' 3"  (1.6 m).   Weight as of 05/09/23: 178 lb (80.7 kg). Last encounter: 05/09/2023.  Reason for encounter: {Blank single:19197::"medication management","post-procedure evaluation and assessment","both, medication management and post-procedure evaluation and assessment","evaluation of worsening, or previously known (established) problem","patient-requested evaluation","follow-up evaluation","evaluation for possible interventional PM therapy/treatment","evaluation of new problem","medication management"}. ***  Discussed the use of AI scribe software for clinical note transcription with the patient, who gave verbal consent to proceed.  History of Present Illness           Pharmacotherapy  Assessment  Analgesic: {There is no content from the last Subjective section.}   Monitoring: [place] PMP: PDMP reviewed during this encounter. 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)","No side-effects or adverse reactions reported."} Compliance: {Blank single:19197::"Medication agreement violation - unsanctioned acquisition/use of additional opioid-containing medication","No problems identified."} Effectiveness: {Blank single:19197::"Clinically acceptable."}  No notes on file  No results found for: "CBDTHCR" No results found for: "D8THCCBX" No results found for: "D9THCCBX"  UDS:  Summary  Date Value Ref Range Status  01/22/2023 Note  Final    Comment:    ==================================================================== [employer] (MW) ==================================================================== Test                             Result       Flag       Units  Drug Present and Declared for Prescription Verification   Oxycodone                       1215         EXPECTED   ng/mg creat   Oxymorphone                    408          EXPECTED   ng/mg creat   Noroxycodone                   1788         EXPECTED   ng/mg creat   Noroxymorphone  101          EXPECTED   ng/mg creat    Sources of oxycodone  are scheduled prescription medications.    Oxymorphone, noroxycodone, and noroxymorphone are expected    metabolites of oxycodone . Oxymorphone is also available as a    scheduled prescription medication.    Buprenorphine                   14           EXPECTED   ng/mg creat   Norbuprenorphine               52           EXPECTED   ng/mg creat    Source of buprenorphine  is a scheduled prescription medication.    Norbuprenorphine is an expected metabolite of  buprenorphine .  ==================================================================== Test                      Result    Flag   Units      Ref Range   Creatinine              101              mg/dL      >=16 ==================================================================== Declared Medications:  The flagging and interpretation on this report are based on the  following declared medications.  Unexpected results may arise from  inaccuracies in the declared medications.   **Note: The testing scope of this panel includes these medications:   Oxycodone  (Roxicodone )   **Note: The testing scope of this panel does not include small to  moderate amounts of these reported medications:   Buprenorphine  Patch (BuTrans )   **Note: The testing scope of this panel does not include the  following reported medications:   Acetaminophen  (Tylenol )  Clobetasol  (Temovate )  Hydrochlorothiazide (Hydrodiuril)  Hydroxyzine (Atarax)  Multivitamin  Sertraline (Zoloft) ==================================================================== For clinical consultation, please call [phone]. ====================================================================       ROS  Constitutional: {Blank single:19197::"Denies any fever or chills"} Gastrointestinal: {Blank single:19197::"No reported hemesis, hematochezia, vomiting, or acute GI distress"} Musculoskeletal: {Blank single:19197::"Denies any acute onset joint swelling, redness, loss of ROM, or weakness"} Neurological: {Blank single:19197::"No reported episodes of acute onset apraxia, aphasia, dysarthria, agnosia, amnesia, paralysis, loss of coordination, or loss of consciousness"}  Medication Review  acetaminophen , buprenorphine , clobetasol , hydrOXYzine, hydrochlorothiazide, multivitamin, and sertraline  History Review  Allergy: [name] is allergic to codeine, pregabalin, hydrocodone -acetaminophen , tramadol , latex, lovastatin, and  simvastatin. Drug: [name]  reports no history of drug use. Alcohol:  reports no history of alcohol use. Tobacco:  reports that she has been smoking cigarettes. She has a 10 pack-year smoking history. She has never used smokeless tobacco. Social: [name]  reports that she has been smoking cigarettes. She has a 10 pack-year smoking history. She has never used smokeless tobacco. She reports that she does not drink alcohol and does not use drugs. Medical:  has a past medical history of Anemia, Arthritis, Asthma, Chronic kidney disease, Chronic pain, and History of kidney stones. Surgical: [name]  has a past surgical history that includes Cervical fusion (2014); carpel tunnel (Right); Cholecystectomy; c sections (N/A, 1985); Colonoscopy; and Shoulder arthroscopy with subacromial decompression, rotator cuff repair and bicep tendon repair (Right, 11/08/2020). Family: family history includes Dementia in her mother; Prostate cancer in her father; Renal Cyst in her father.  Laboratory Chemistry Profile   Renal Lab Results  Component Value Date   BUN 11  11/08/2020   CREATININE 0.97 11/08/2020   GFRAA >60 10/16/2019   GFRNONAA >60 11/08/2020    Hepatic Lab Results  Component Value Date   AST 20 10/16/2019   ALT 15 10/16/2019   ALBUMIN 4.0 10/16/2019   ALKPHOS 75 10/16/2019   LIPASE 29 10/16/2019    Electrolytes Lab Results  Component Value Date   NA 137 11/08/2020   K 3.9 11/08/2020   CL 102 11/08/2020   CALCIUM 9.0 11/08/2020    Bone No results found for: "VD25OH", "VD125OH2TOT", "GU4403KV4", "QV9563OV5", "25OHVITD1", "25OHVITD2", "25OHVITD3", "TESTOFREE", "TESTOSTERONE"  Inflammation (CRP: Acute Phase) (ESR: Chronic Phase) No results found for: "CRP", "ESRSEDRATE", "LATICACIDVEN"       Note: {Blank single:19197::"[name] indicates labs done and monitored by primary care practitioner using a non-[facility] EMR system","No results found under the [facility] electronic medical  record","Results made available to patient.","Lab results reviewed and made available to patient.","Lab results reviewed and explained to patient in Layman's terms.","Above Lab results reviewed."}  Recent Imaging Review  US  Venous Img Lower Unilateral Left (DVT) CLINICAL DATA:  Left lower extremity swelling, pain  EXAM: LEFT LOWER EXTREMITY VENOUS DOPPLER ULTRASOUND  TECHNIQUE: Gray-scale sonography with compression, as well as color and duplex ultrasound, were performed to evaluate the deep venous system(s) from the level of the common femoral vein through the popliteal and proximal calf veins.  COMPARISON:  10/26/2016  FINDINGS: VENOUS  Normal compressibility of the common femoral, superficial femoral, and popliteal veins, as well as the visualized calf veins. Visualized portions of profunda femoral vein and great saphenous vein unremarkable. No filling defects to suggest DVT on grayscale or color Doppler imaging. Doppler waveforms show normal direction of venous flow, normal respiratory plasticity and response to augmentation.  Limited views of the contralateral common femoral vein are unremarkable.  OTHER  Popliteal fossa cyst measuring 4.2 x 2.6 x 1.2 cm.  Limitations: none  IMPRESSION: No evidence of left lower extremity DVT.  Left Baker's cyst.  Electronically Signed   By: Janeece Mechanic M.D.   On: 06/20/2023 00:11 Note: {Blank single:19197::"No new results found.","No results found under the [facility] electronic medical record.","Imaging results reviewed and explained to patient in Layman's terms.","Results of ordered imaging test(s) reviewed and explained to patient in Layman's terms.","Imaging results reviewed.","Reviewed"} {Blank single:19197::"Results visible under [facility].","Results visible under [facility].","Results visible under [facility].","Results visible under [facility].","Results visible under "Care Everywhere".","Results made available to  patient.","Copy of results provided to patient.","     "}   Physical Exam  General appearance: {general exam:210120802::"Well nourished, well developed, and well hydrated. In no apparent acute distress"} Mental status: {Blank single:19197::"Alert and oriented x 3. Exaggerated physical and/or psychosocial pain behavior perceived.","Alert, oriented x 3 (person, place, & time)"} {Blank single:19197::"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 BMI: Estimated body mass index is 31.53 kg/m as calculated from the following:   Height as of 05/09/23: 5\' 3"  (1.6 m).   Weight as of 05/09/23: 178 lb (80.7 kg). Ideal: Patient weight not recorded  Assessment   Diagnosis Status  1. 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 Lumbar radiculopathy   3. Lumbar radicular pain   4. Chronic pain syndrome    {Problem Stability:19197::"Deteriorating","Having a Flare-up","Improved","Improving","Not improving","Not responding","Persistent","Recurring","Reoccurring","Resolved","Responding","Stable","Unchanged","Unimproved","Worsened","Worsening","Controlled"} {Problem Stability:19197::"Deteriorating","Having a Flare-up","Improved","Improving","Not improving","Not  responding","Persistent","Recurring","Reoccurring","Resolved","Responding","Stable","Unchanged","Unimproved","Worsened","Worsening","Controlled"} {Problem Stability:19197::"Deteriorating","Having a Flare-up","Improved","Improving","Not improving","Not responding","Persistent","Recurring","Reoccurring","Resolved","Responding","Stable","Unchanged","Unimproved","Worsened","Worsening","Controlled"}   Plan of Care   Assessment and Plan             Pharmacotherapy (Medications Ordered): No orders of the defined types were placed in this encounter.  Orders:  No orders of the defined types were placed in this encounter.  Follow-up plan:   No follow-ups on file.    Recent Visits Date Type Provider Dept  05/09/23 Office Visit Cephus Collin, MD [facility]  Showing recent visits within past 90 days and meeting all other requirements Future Appointments Date Type Provider Dept  08/01/23 Appointment Shalen Petrak K, NP [facility]  Showing future appointments within next 90 days and meeting all other requirements  I discussed the assessment and treatment plan with the patient. The patient was provided an opportunity to ask questions and all were answered. The patient agreed with the plan and demonstrated an understanding of the instructions.  Patient advised to call back or seek an in-person evaluation if the symptoms or condition worsens.  Duration of encounter: *** minutes.  Total time on encounter, as per AMA guidelines included both the face-to-face and non-face-to-face time personally spent by the physician and/or other qualified health care professional(s) on the day of the encounter (includes time in activities that require the physician or other qualified health care professional and does not include time in activities normally performed by clinical staff). Physician's time may include the following activities when performed: Preparing to see the patient (e.g., pre-charting review of records, searching for previously ordered imaging, lab work, and nerve conduction tests) Review of prior analgesic pharmacotherapies. Reviewing PMP Interpreting ordered tests (e.g., lab work, imaging, nerve conduction tests) Performing post-procedure evaluations, including interpretation of diagnostic procedures Obtaining and/or reviewing separately obtained history Performing a medically  appropriate examination and/or evaluation Counseling and educating the patient/family/caregiver Ordering medications, tests, or procedures Referring and communicating with other health care professionals (when not separately reported) Documenting clinical information in the electronic or other health record Independently interpreting results (not separately reported) and communicating results to the patient/ family/caregiver Care coordination (not separately reported)  Note by: Alinda Egolf K Donie Moulton, NP (TTS and AI technology used. I apologize for any typographical errors that were not detected and corrected.) Date: 08/01/2023; Time: 10:42 AM

## 2023-08-01 ENCOUNTER — Encounter: Payer: Self-pay | Admitting: Nurse Practitioner

## 2023-08-01 ENCOUNTER — Ambulatory Visit
Payer: Medicare Other | Attending: Student in an Organized Health Care Education/Training Program | Admitting: Nurse Practitioner

## 2023-08-01 VITALS — BP 120/72 | HR 65 | Temp 97.3°F | Resp 16 | Ht 61.0 in | Wt 177.0 lb

## 2023-08-01 DIAGNOSIS — M5416 Radiculopathy, lumbar region: Secondary | ICD-10-CM | POA: Diagnosis present

## 2023-08-01 DIAGNOSIS — M48061 Spinal stenosis, lumbar region without neurogenic claudication: Secondary | ICD-10-CM | POA: Insufficient documentation

## 2023-08-01 DIAGNOSIS — Z79899 Other long term (current) drug therapy: Secondary | ICD-10-CM | POA: Diagnosis present

## 2023-08-01 DIAGNOSIS — G894 Chronic pain syndrome: Secondary | ICD-10-CM | POA: Insufficient documentation

## 2023-08-01 DIAGNOSIS — M47816 Spondylosis without myelopathy or radiculopathy, lumbar region: Secondary | ICD-10-CM | POA: Diagnosis present

## 2023-08-01 MED ORDER — BUPRENORPHINE 15 MCG/HR TD PTWK: 1.0000 | MEDICATED_PATCH | TRANSDERMAL | 2 refills | Status: DC

## 2023-08-01 MED ORDER — OXYCODONE HCL 5 MG PO CAPS: 5.0000 mg | ORAL_CAPSULE | Freq: Every day | ORAL | 0 refills | Status: DC | PRN

## 2023-08-01 NOTE — Progress Notes (Signed)
 Nursing Pain Medication Assessment:  Safety precautions to be maintained throughout the outpatient stay will include: orient to surroundings, keep bed in low position, maintain call bell within reach at all times, provide assistance with transfer out of bed and ambulation.  Medication Inspection Compliance: Pill count conducted under aseptic conditions, in front of the patient. Neither the pills nor the bottle was removed from the patient's sight at any time. Once count was completed pills were immediately returned to the patient in their original bottle.  Medication: Oxycodone  IR Pill/Patch Count: 4 of 30 pills/patches remain Pill/Patch Appearance: Markings consistent with prescribed medication Bottle Appearance: Standard pharmacy container. Clearly labeled. Filled Date: 02 / 20 / 2025 Last Medication intake:  Day before yesterday  Nursing Pain Medication Assessment:  Safety precautions to be maintained throughout the outpatient stay will include: orient to surroundings, keep bed in low position, maintain call bell within reach at all times, provide assistance with transfer out of bed and ambulation.  Medication Inspection Compliance: Pill count conducted under aseptic conditions, in front of the patient. Neither the pills nor the bottle was removed from the patient's sight at any time. Once count was completed pills were immediately returned to the patient in their original bottle.  Medication: Buprenorphine  patch ([place] ) Pill/Patch Count: 0 of 4 pills/patches remain Pill/Patch Appearance: no patches left Bottle Appearance: Standard pharmacy container. Clearly labeled. Filled Date: 4 / 81 / 2025 Last Medication intake:  Today

## 2023-10-01 ENCOUNTER — Encounter: Payer: Self-pay | Admitting: Nurse Practitioner

## 2023-10-01 ENCOUNTER — Ambulatory Visit: Attending: Nurse Practitioner | Admitting: Nurse Practitioner

## 2023-10-01 VITALS — BP 126/99 | HR 64 | Resp 18 | Ht 61.0 in | Wt 177.0 lb

## 2023-10-01 DIAGNOSIS — G894 Chronic pain syndrome: Secondary | ICD-10-CM | POA: Diagnosis not present

## 2023-10-01 DIAGNOSIS — M48061 Spinal stenosis, lumbar region without neurogenic claudication: Secondary | ICD-10-CM | POA: Diagnosis not present

## 2023-10-01 DIAGNOSIS — Z79899 Other long term (current) drug therapy: Secondary | ICD-10-CM | POA: Insufficient documentation

## 2023-10-01 DIAGNOSIS — M5416 Radiculopathy, lumbar region: Secondary | ICD-10-CM | POA: Diagnosis present

## 2023-10-01 DIAGNOSIS — M47816 Spondylosis without myelopathy or radiculopathy, lumbar region: Secondary | ICD-10-CM | POA: Insufficient documentation

## 2023-10-01 MED ORDER — OXYCODONE HCL 5 MG PO CAPS: 5.0000 mg | ORAL_CAPSULE | Freq: Every day | ORAL | 0 refills | Status: DC | PRN

## 2023-10-01 MED ORDER — BUPRENORPHINE 15 MCG/HR TD PTWK: 1.0000 | MEDICATED_PATCH | TRANSDERMAL | 2 refills | Status: DC

## 2023-10-01 NOTE — Progress Notes (Signed)
 PROVIDER NOTE: Interpretation of information contained herein should be left to medically-trained personnel. Specific patient instructions are provided elsewhere under Patient Instructions section of medical record. This document was created in part using AI and STT-dictation technology, any transcriptional errors that may result from this process are unintentional.  Patient: Leslie Carrillo  Service: E/M   PCP: Sherial Bail, MD  DOB: 09-22-1960  DOS: 10/01/2023  Provider: Emmy MARLA Blanch, NP  MRN: [id number]  Delivery: Face-to-face  Specialty: Interventional Pain Management  Type: Established Patient  Setting: Ambulatory outpatient facility  Specialty designation: 09  Referring Prov.: Sherial Bail, MD  Location: Outpatient office facility       History of present illness (HPI) [name], a 63 y.o. year old female, is here today because of her Chronic pain syndrome [G89.4]. [name] primary complain today is Back Pain  Pertinent problems: Leslie Carrillo has Chronic low back pain; DDD (degenerative disc disease), thoracic; Lumbar radiculopathy; Cervical facet joint syndrome; Bilateral primary osteoarthritis of knee; Myofascial pain syndrome; Lumbar spondylosis, and Chronic pain syndrome on their pertinent problem list.  Pain Assessment: Severity of Chronic pain is reported as a 3 /10. Location: Back Lower/Radiates into both hips and legs. Onset: More than a month ago. Quality: Aching, Constant. Timing: Constant. Modifying factor(s): Medication. Vitals:  height is 5' 1 (1.549 m) and weight is 177 lb (80.3 kg). Her blood pressure is 126/99 (abnormal) and her pulse is 64. Her respiration is 18 and oxygen saturation is 99%.  BMI: Estimated body mass index is 33.44 kg/m as calculated from the following:   Height as of this encounter: 5' 1 (1.549 m).   Weight as of this encounter: 177 lb (80.3 kg).  Last encounter: 08/01/2023. Last procedure: 05/24/2022  Reason for encounter:  medication management. No change in medical history since last visit.  Patient's pain is at baseline.  Patient continues multimodal pain regimen as prescribed.  States that it provides pain relief and improvement in functional status.  Pharmacotherapy Assessment   Buprenorphine  (Butrans ) 15 mcg/hr, 1 patch onto the skin once a week Oxycodone  (Oxy-IR) 5 mg capsule daily as needed. MME=7.50 Monitoring: [place] PMP: PDMP reviewed during this encounter.       Pharmacotherapy: No side-effects or adverse reactions reported. Compliance: No problems identified. Effectiveness: Clinically acceptable.  Erlene Doyal JONELLE, [place]  10/01/2023  9:35 AM  Sign when Signing Visit Nursing Pain Medication Assessment:  Safety precautions to be maintained throughout the outpatient stay will include: orient to surroundings, keep bed in low position, maintain call bell within reach at all times, provide assistance with transfer out of bed and ambulation.  Medication Inspection Compliance: Pill count conducted under aseptic conditions, in front of the patient. Neither the pills nor the bottle was removed from the patient's sight at any time. Once count was completed pills were immediately returned to the patient in their original bottle.  Medication: Buprenorphine  patch (Butrans ) Pill/Patch Count: 0 of 4 pills/patches remain Pill/Patch Appearance: Markings consistent with prescribed medication Bottle Appearance: Standard pharmacy container. Clearly labeled. Filled Date: 06 / 15 / 2025 Last Medication intake:  09/26/2023  Erlene Doyal JONELLE, CMA  10/01/2023  9:33 AM  Sign when Signing Visit Nursing Pain Medication Assessment:  Safety precautions to be maintained throughout the outpatient stay will include: orient to surroundings, keep bed in low position, maintain call bell within reach at all times, provide assistance with transfer out of bed and ambulation.  Medication Inspection Compliance: Pill count conducted under aseptic  conditions, in front of the patient. Neither the pills nor the bottle was removed from the patient's sight at any time. Once count was completed pills were immediately returned to the patient in their original bottle.  Medication: Oxycodone  ER (OxyContin ) Pill/Patch Count: 7 of 30 pills/patches remain Pill/Patch Appearance: Markings consistent with prescribed medication Bottle Appearance: Standard pharmacy container. Clearly labeled. Filled Date: 05 / 15 / 2025 Last Medication intake:  Day before yesterday    UDS:  Summary  Date Value Ref Range Status  01/22/2023 Note  Final    Comment:    ==================================================================== [facility] (MW) ==================================================================== Test                             Result       Flag       Units  Drug Present and Declared for Prescription Verification   Oxycodone                       1215         EXPECTED   ng/mg creat   Oxymorphone                    408          EXPECTED   ng/mg creat   Noroxycodone                   1788         EXPECTED   ng/mg creat   Noroxymorphone                 101          EXPECTED   ng/mg creat    Sources of oxycodone  are scheduled prescription medications.    Oxymorphone, noroxycodone, and noroxymorphone are expected    metabolites of oxycodone . Oxymorphone is also available as a    scheduled prescription medication.    Buprenorphine                   14           EXPECTED   ng/mg creat   Norbuprenorphine               52           EXPECTED   ng/mg creat    Source of buprenorphine  is a scheduled prescription medication.    Norbuprenorphine is an expected metabolite of buprenorphine .  ==================================================================== Test                      Result    Flag   Units      Ref Range   Creatinine              101              mg/dL       >=79 ==================================================================== Declared Medications:  The flagging and interpretation on this report are based on the  following declared medications.  Unexpected results may arise from  inaccuracies in the declared medications.   **Note: The testing scope of this panel includes these medications:   Oxycodone  (Roxicodone )   **Note: The testing scope of this panel does not include small to  moderate amounts of these reported medications:   Buprenorphine  Patch (BuTrans )   **Note: The testing scope of this panel does not include the  following reported medications:   Acetaminophen  (  Tylenol )  Clobetasol  (Temovate )  Hydrochlorothiazide (Hydrodiuril)  Hydroxyzine (Atarax)  Multivitamin  Sertraline (Zoloft) ==================================================================== For clinical consultation, please call [phone]. ====================================================================     No results found for: CBDTHCR No results found for: D8THCCBX No results found for: D9THCCBX  ROS  Constitutional: Denies any fever or chills Gastrointestinal: No reported hemesis, hematochezia, vomiting, or acute GI distress Musculoskeletal: Low back pain bilateral Neurological: No reported episodes of acute onset apraxia, aphasia, dysarthria, agnosia, amnesia, paralysis, loss of coordination, or loss of consciousness  Medication Review  acetaminophen , buPROPion HCl, buprenorphine , hydrOXYzine, hydrochlorothiazide, multivitamin, oxycodone , and sertraline  History Review  Allergy: Ms. Icenhower is allergic to codeine, pregabalin, hydrocodone -acetaminophen , tramadol , latex, lovastatin, and simvastatin. Drug: Ms. Fryberger  reports no history of drug use. Alcohol:  reports no history of alcohol use. Tobacco:  reports that she has been smoking cigarettes. She has a 10 pack-year smoking history. She has never used smokeless tobacco. Social:  Ms. Bloch  reports that she has been smoking cigarettes. She has a 10 pack-year smoking history. She has never used smokeless tobacco. She reports that she does not drink alcohol and does not use drugs. Medical:  has a past medical history of Anemia, Arthritis, Asthma, Chronic kidney disease, Chronic pain, and History of kidney stones. Surgical: Ms. Britz  has a past surgical history that includes Cervical fusion (2014); carpel tunnel (Right); Cholecystectomy; c sections (N/A, 1985); Colonoscopy; and Shoulder arthroscopy with subacromial decompression, rotator cuff repair and bicep tendon repair (Right, 11/08/2020). Family: family history includes Dementia in her mother; Prostate cancer in her father; Renal Cyst in her father.  Laboratory Chemistry Profile   Renal Lab Results  Component Value Date   BUN 11 11/08/2020   CREATININE 0.97 11/08/2020   GFRAA >60 10/16/2019   GFRNONAA >60 11/08/2020    Hepatic Lab Results  Component Value Date   AST 20 10/16/2019   ALT 15 10/16/2019   ALBUMIN 4.0 10/16/2019   ALKPHOS 75 10/16/2019   LIPASE 29 10/16/2019    Electrolytes Lab Results  Component Value Date   NA 137 11/08/2020   K 3.9 11/08/2020   CL 102 11/08/2020   CALCIUM 9.0 11/08/2020    Bone No results found for: VD25OH, VD125OH2TOT, CI6874NY7, CI7874NY7, 25OHVITD1, 25OHVITD2, 25OHVITD3, TESTOFREE, TESTOSTERONE  Inflammation (CRP: Acute Phase) (ESR: Chronic Phase) No results found for: CRP, ESRSEDRATE, LATICACIDVEN       Note: Above Lab results reviewed.  Recent Imaging Review  US  Venous Img Lower Unilateral Left (DVT) CLINICAL DATA:  Left lower extremity swelling, pain  EXAM: LEFT LOWER EXTREMITY VENOUS DOPPLER ULTRASOUND  TECHNIQUE: Gray-scale sonography with compression, as well as color and duplex ultrasound, were performed to evaluate the deep venous system(s) from the level of the common femoral vein through the popliteal and proximal calf  veins.  COMPARISON:  10/26/2016  FINDINGS: VENOUS  Normal compressibility of the common femoral, superficial femoral, and popliteal veins, as well as the visualized calf veins. Visualized portions of profunda femoral vein and great saphenous vein unremarkable. No filling defects to suggest DVT on grayscale or color Doppler imaging. Doppler waveforms show normal direction of venous flow, normal respiratory plasticity and response to augmentation.  Limited views of the contralateral common femoral vein are unremarkable.  OTHER  Popliteal fossa cyst measuring 4.2 x 2.6 x 1.2 cm.  Limitations: none  IMPRESSION: No evidence of left lower extremity DVT.  Left Baker's cyst.  Electronically Signed   By: Franky Crease M.D.   On: 06/20/2023 00:11 Note:  Reviewed        Physical Exam  Vitals: BP (!) 126/99   Pulse 64   Resp 18   Ht 5' 1 (1.549 m)   Wt 177 lb (80.3 kg)   SpO2 99%   BMI 33.44 kg/m  BMI: Estimated body mass index is 33.44 kg/m as calculated from the following:   Height as of this encounter: 5' 1 (1.549 m).   Weight as of this encounter: 177 lb (80.3 kg). Ideal: Ideal body weight: 47.8 kg (105 lb 6.1 oz) Adjusted ideal body weight: 60.8 kg (134 lb 0.5 oz) General appearance: Well nourished, well developed, and well hydrated. In no apparent acute distress Mental status: Alert, oriented x 3 (person, place, & time)       Respiratory: No evidence of acute respiratory distress Eyes: PERLA   Assessment   Diagnosis Status  1. Chronic pain syndrome   2. 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 Lumbar radiculopathy   4. Lumbar radicular pain   5. Medication management   6. Lumbar spondylosis    Controlled Controlled Controlled   Updated Problems: No problems updated.  Plan of Care  Problem-specific:  Assessment and Plan  We will continue on current medication regimen.  Prescribing drug monitoring (PMP) reviewed, findings consistent  with use of prescribed medication and no evidence of narcotic misuse or abuse. Urine drug screening (UDS) up-to-date.  Schedule follow-up visit in 90 days.  No other issues or new problems reported to this visit.   Ms. CIPRIANA BILLER has a current medication list which includes the following long-term medication(s): bupropion hcl, hydrochlorothiazide, and sertraline.  Pharmacotherapy (Medications Ordered): Meds ordered this encounter  Medications   buprenorphine  (BUTRANS ) 15 MCG/HR    Sig: Place 1 patch onto the skin once a week. For chronic pain syndrome    Dispense:  4 patch    Refill:  2   oxycodone  (OXY-IR) 5 MG capsule    Sig: Take 1 capsule (5 mg total) by mouth daily as needed.    Dispense:  30 capsule    Refill:  0   Orders:  No orders of the defined types were placed in this encounter.       Return in about 3 months (around 01/01/2024) for (F2F), (MM), Emmy Blanch NP.    Recent Visits Date Type Provider Dept  08/01/23 Office Visit Josephine Wooldridge K, NP [facility]  Showing recent visits within past 90 days and meeting all other requirements Today's Visits Date Type Provider Dept  10/01/23 Office Visit Shamaine Mulkern K, NP [facility]  Showing today's visits and meeting all other requirements Future Appointments No visits were found meeting these conditions. Showing future appointments within next 90 days and meeting all other requirements  I discussed the assessment and treatment plan with the patient. The patient was provided an opportunity to ask questions and all were answered. The patient agreed with the plan and demonstrated an understanding of the instructions.  Patient advised to call back or seek an in-person evaluation if the symptoms or condition worsens.  Duration of encounter: 30 minutes.  Total time on encounter, as per AMA guidelines included both the face-to-face and non-face-to-face time personally spent by the physician and/or other  qualified health care professional(s) on the day of the encounter (includes time in activities that require the physician or other qualified health care professional and does not include time in activities normally performed by clinical staff). Physician's time may  include the following activities when performed: Preparing to see the patient (e.g., pre-charting review of records, searching for previously ordered imaging, lab work, and nerve conduction tests) Review of prior analgesic pharmacotherapies. Reviewing PMP Interpreting ordered tests (e.g., lab work, imaging, nerve conduction tests) Performing post-procedure evaluations, including interpretation of diagnostic procedures Obtaining and/or reviewing separately obtained history Performing a medically appropriate examination and/or evaluation Counseling and educating the patient/family/caregiver Ordering medications, tests, or procedures Referring and communicating with other health care professionals (when not separately reported) Documenting clinical information in the electronic or other health record Independently interpreting results (not separately reported) and communicating results to the patient/ family/caregiver Care coordination (not separately reported)  Note by: Argil Mahl K Kamarian Sahakian, NP (TTS and AI technology used. I apologize for any typographical errors that were not detected and corrected.) Date: 10/01/2023; Time: 10:04 AM

## 2023-10-01 NOTE — Progress Notes (Signed)
 Nursing Pain Medication Assessment:  Safety precautions to be maintained throughout the outpatient stay will include: orient to surroundings, keep bed in low position, maintain call bell within reach at all times, provide assistance with transfer out of bed and ambulation.  Medication Inspection Compliance: Pill count conducted under aseptic conditions, in front of the patient. Neither the pills nor the bottle was removed from the patient's sight at any time. Once count was completed pills were immediately returned to the patient in their original bottle.  Medication: Buprenorphine  patch (Butrans ) Pill/Patch Count: 0 of 4 pills/patches remain Pill/Patch Appearance: Markings consistent with prescribed medication Bottle Appearance: Standard pharmacy container. Clearly labeled. Filled Date: 06 / 15 / 2025 Last Medication intake:  09/26/2023

## 2023-10-01 NOTE — Progress Notes (Signed)
 Nursing Pain Medication Assessment:  Safety precautions to be maintained throughout the outpatient stay will include: orient to surroundings, keep bed in low position, maintain call bell within reach at all times, provide assistance with transfer out of bed and ambulation.  Medication Inspection Compliance: Pill count conducted under aseptic conditions, in front of the patient. Neither the pills nor the bottle was removed from the patient's sight at any time. Once count was completed pills were immediately returned to the patient in their original bottle.  Medication: Oxycodone  ER (OxyContin ) Pill/Patch Count: 7 of 30 pills/patches remain Pill/Patch Appearance: Markings consistent with prescribed medication Bottle Appearance: Standard pharmacy container. Clearly labeled. Filled Date: 05 / 15 / 2025 Last Medication intake:  Day before yesterday

## 2023-12-25 ENCOUNTER — Encounter: Payer: Self-pay | Admitting: Nurse Practitioner

## 2023-12-25 ENCOUNTER — Ambulatory Visit: Attending: Nurse Practitioner | Admitting: Nurse Practitioner

## 2023-12-25 VITALS — BP 145/85 | HR 67 | Temp 97.4°F | Resp 18 | Ht 61.0 in | Wt 178.0 lb

## 2023-12-25 DIAGNOSIS — M5416 Radiculopathy, lumbar region: Secondary | ICD-10-CM | POA: Insufficient documentation

## 2023-12-25 DIAGNOSIS — Z79899 Other long term (current) drug therapy: Secondary | ICD-10-CM | POA: Insufficient documentation

## 2023-12-25 DIAGNOSIS — G894 Chronic pain syndrome: Secondary | ICD-10-CM | POA: Diagnosis present

## 2023-12-25 DIAGNOSIS — M48061 Spinal stenosis, lumbar region without neurogenic claudication: Secondary | ICD-10-CM | POA: Diagnosis not present

## 2023-12-25 MED ORDER — BUPRENORPHINE 15 MCG/HR TD PTWK: 1.0000 | MEDICATED_PATCH | TRANSDERMAL | 2 refills | Status: DC

## 2023-12-25 MED ORDER — OXYCODONE HCL 5 MG PO CAPS: 5.0000 mg | ORAL_CAPSULE | Freq: Every day | ORAL | 0 refills | Status: DC | PRN

## 2023-12-25 NOTE — Progress Notes (Signed)
 Nursing Pain Medication Assessment:  Safety precautions to be maintained throughout the outpatient stay will include: orient to surroundings, keep bed in low position, maintain call bell within reach at all times, provide assistance with transfer out of bed and ambulation.  Medication Inspection Compliance: Pill count conducted under aseptic conditions, in front of the patient. Neither the pills nor the bottle was removed from the patient's sight at any time. Once count was completed pills were immediately returned to the patient in their original bottle.  Medication: Oxycodone  IR Pill/Patch Count: 4 of 30 pills/patches remain Pill/Patch Appearance: Markings consistent with prescribed medication Bottle Appearance: Standard pharmacy container. Clearly labeled. Filled Date: 07 / 15 / 2025 Last Medication intake:  Today

## 2023-12-25 NOTE — Progress Notes (Signed)
 Nursing Pain Medication Assessment:  Safety precautions to be maintained throughout the outpatient stay will include: orient to surroundings, keep bed in low position, maintain call bell within reach at all times, provide assistance with transfer out of bed and ambulation.  Medication Inspection Compliance: Pill count conducted under aseptic conditions, in front of the patient. Neither the pills nor the bottle was removed from the patient's sight at any time. Once count was completed pills were immediately returned to the patient in their original bottle.  Medication: Buprenorphine  patch (Butrans ) Pill/Patch Count: 0 of 4 pills/patches remain Pill/Patch Appearance: Markings consistent with prescribed medication Bottle Appearance: Standard pharmacy container. Clearly labeled. Filled Date: 09 / 05 / 2025 Last Medication intake:  12/19/2023

## 2023-12-25 NOTE — Progress Notes (Signed)
 PROVIDER NOTE: Interpretation of information contained herein should be left to medically-trained personnel. Specific patient instructions are provided elsewhere under Patient Instructions section of medical record. This document was created in part using AI and STT-dictation technology, any transcriptional errors that may result from this process are unintentional.  Patient: Leslie Carrillo  Service: E/M   PCP: Sherial Bail, MD  DOB: 04-15-1960  DOS: 12/25/2023  Provider: Emmy MARLA Blanch, NP  MRN: [id number]  Delivery: Face-to-face  Specialty: Interventional Pain Management  Type: Established Patient  Setting: Ambulatory outpatient facility  Specialty designation: 09  Referring Prov.: Sherial Bail, MD  Location: Outpatient office facility       History of present illness (HPI) Ms. ANNALEIGH Carrillo, a 63 y.o. year old female, is here today because of her low back pain radiating to the right hip. Leslie Carrillo primary complain today is Back Pain (Radiating into Right hip )  Pertinent problems: Leslie Carrillo  has Chronic low back pain; DDD (degenerative disc disease), thoracic; Lumbar radiculopathy; Cervical facet joint syndrome; Bilateral primary osteoarthritis of knee; Myofascial pain syndrome; Lumbar spondylosis, and Chronic pain syndrome on their pertinent problem list.  Pain Assessment: Severity of Chronic pain is reported as a 5 /10. Location: Back Lower/Radiating into right hip. Onset: More than a month ago. Quality: Aching, Sharp. Timing: Constant. Modifying factor(s): Medication. Vitals:  height is 5' 1 (1.549 m) and weight is 178 lb (80.7 kg). Her temporal temperature is 97.4 F (36.3 C) (abnormal). Her blood pressure is 145/85 (abnormal) and her pulse is 67. Her respiration is 18 and oxygen saturation is 100%.  BMI: Estimated body mass index is 33.63 kg/m as calculated from the following:   Height as of this encounter: 5' 1 (1.549 m).   Weight as of this encounter: 178 lb (80.7  kg).  Last encounter: 10/01/2023. Last procedure: Visit date not found.  Reason for encounter: medication management. No change in medical history since last visit.  Patient's pain is at baseline.  Patient continues multimodal pain regimen as prescribed.  States that it provides pain relief and improvement in functional status.  The patient continues struggling with low back pain that radiates to right hip, however the current pain medication regimen well manage pain level and provide functional abilities. Pharmacotherapy Assessment   Buprenorphine  (Butrans ) 15 mcg/hr, 1 patch onto the skin once a week Oxycodone  (Oxy-IR) 5 mg capsule daily as needed. MME=7.50 Monitoring: [place] PMP: PDMP reviewed during this encounter.       Pharmacotherapy: No side-effects or adverse reactions reported. Compliance: No problems identified. Effectiveness: Clinically acceptable.  Leslie Carrillo, [place]  12/25/2023 11:19 AM  Sign when Signing Visit Nursing Pain Medication Assessment:  Safety precautions to be maintained throughout the outpatient stay will include: orient to surroundings, keep bed in low position, maintain call bell within reach at all times, provide assistance with transfer out of bed and ambulation.  Medication Inspection Compliance: Pill count conducted under aseptic conditions, in front of the patient. Neither the pills nor the bottle was removed from the patient's sight at any time. Once count was completed pills were immediately returned to the patient in their original bottle.  Medication: Oxycodone  IR Pill/Patch Count: 4 of 30 pills/patches remain Pill/Patch Appearance: Markings consistent with prescribed medication Bottle Appearance: Standard pharmacy container. Clearly labeled. Filled Date: 07 / 15 / 2025 Last Medication intake:  Today  Leslie Carrillo, CMA  12/25/2023 11:18 AM  Sign when Signing Visit Nursing Pain Medication Assessment:  Safety  precautions to be maintained throughout the  outpatient stay will include: orient to surroundings, keep bed in low position, maintain call bell within reach at all times, provide assistance with transfer out of bed and ambulation.  Medication Inspection Compliance: Pill count conducted under aseptic conditions, in front of the patient. Neither the pills nor the bottle was removed from the patient's sight at any time. Once count was completed pills were immediately returned to the patient in their original bottle.  Medication: Buprenorphine  patch (Butrans ) Pill/Patch Count: 0 of 4 pills/patches remain Pill/Patch Appearance: Markings consistent with prescribed medication Bottle Appearance: Standard pharmacy container. Clearly labeled. Filled Date: 09 / 05 / 2025 Last Medication intake:  12/19/2023    UDS:  Summary  Date Value Ref Range Status  01/22/2023 Note  Final    Comment:    ==================================================================== [employer] Select 13 (MW) ==================================================================== Test                             Result       Flag       Units  Drug Present and Declared for Prescription Verification   Oxycodone                       1215         EXPECTED   ng/mg creat   Oxymorphone                    408          EXPECTED   ng/mg creat   Noroxycodone                   1788         EXPECTED   ng/mg creat   Noroxymorphone                 101          EXPECTED   ng/mg creat    Sources of oxycodone  are scheduled prescription medications.    Oxymorphone, noroxycodone, and noroxymorphone are expected    metabolites of oxycodone . Oxymorphone is also available as a    scheduled prescription medication.    Buprenorphine                   14           EXPECTED   ng/mg creat   Norbuprenorphine               52           EXPECTED   ng/mg creat    Source of buprenorphine  is a scheduled prescription medication.    Norbuprenorphine is an expected metabolite of  buprenorphine .  ==================================================================== Test                      Result    Flag   Units      Ref Range   Creatinine              101              mg/dL      >=79 ==================================================================== Declared Medications:  The flagging and interpretation on this report are based on the  following declared medications.  Unexpected results may arise from  inaccuracies in the declared medications.   **Note: The testing scope of this panel includes these medications:   Oxycodone  (Roxicodone )   **Note:  The testing scope of this panel does not include small to  moderate amounts of these reported medications:   Buprenorphine  Patch (BuTrans )   **Note: The testing scope of this panel does not include the  following reported medications:   Acetaminophen  (Tylenol )  Clobetasol  (Temovate )  Hydrochlorothiazide (Hydrodiuril)  Hydroxyzine (Atarax)  Multivitamin  Sertraline (Zoloft) ==================================================================== For clinical consultation, please call [phone]. ====================================================================     No results found for: CBDTHCR No results found for: D8THCCBX No results found for: D9THCCBX  ROS  Constitutional: Denies any fever or chills Gastrointestinal: No reported hemesis, hematochezia, vomiting, or acute GI distress Musculoskeletal: Lower back pain radiating down to the right hip Neurological: No reported episodes of acute onset apraxia, aphasia, dysarthria, agnosia, amnesia, paralysis, loss of coordination, or loss of consciousness  Medication Review  Clobetasol  Propionate, acetaminophen , buPROPion HCl, buprenorphine , furosemide, hydrOXYzine, hydrochlorothiazide, multivitamin, oxycodone , and sertraline  History Review  Allergy: Ms. Virgen is allergic to codeine, pregabalin, hydrocodone -acetaminophen , tramadol , latex,  lovastatin, and simvastatin. Drug: Ms. Teachey  reports no history of drug use. Alcohol:  reports no history of alcohol use. Tobacco:  reports that she has been smoking cigarettes. She has a 10 pack-year smoking history. She has never used smokeless tobacco. Social: Ms. Rothschild  reports that she has been smoking cigarettes. She has a 10 pack-year smoking history. She has never used smokeless tobacco. She reports that she does not drink alcohol and does not use drugs. Medical:  has a past medical history of Anemia, Arthritis, Asthma, Chronic kidney disease, Chronic pain, and History of kidney stones. Surgical: Ms. Sokoloski  has a past surgical history that includes Cervical fusion (2014); carpel tunnel (Right); Cholecystectomy; c sections (N/A, 1985); Colonoscopy; and Shoulder arthroscopy with subacromial decompression, rotator cuff repair and bicep tendon repair (Right, 11/08/2020). Family: family history includes Dementia in her mother; Prostate cancer in her father; Renal Cyst in her father.  Laboratory Chemistry Profile   Renal Lab Results  Component Value Date   BUN 11 11/08/2020   CREATININE 0.97 11/08/2020   GFRAA >60 10/16/2019   GFRNONAA >60 11/08/2020    Hepatic Lab Results  Component Value Date   AST 20 10/16/2019   ALT 15 10/16/2019   ALBUMIN 4.0 10/16/2019   ALKPHOS 75 10/16/2019   LIPASE 29 10/16/2019    Electrolytes Lab Results  Component Value Date   NA 137 11/08/2020   K 3.9 11/08/2020   CL 102 11/08/2020   CALCIUM 9.0 11/08/2020    Bone No results found for: VD25OH, VD125OH2TOT, CI6874NY7, CI7874NY7, 25OHVITD1, 25OHVITD2, 25OHVITD3, TESTOFREE, TESTOSTERONE  Inflammation (CRP: Acute Phase) (ESR: Chronic Phase) No results found for: CRP, ESRSEDRATE, LATICACIDVEN       Note: Above Lab results reviewed.  Recent Imaging Review  US  Venous Img Lower Unilateral Left (DVT) CLINICAL DATA:  Left lower extremity swelling, pain  EXAM: LEFT LOWER  EXTREMITY VENOUS DOPPLER ULTRASOUND  TECHNIQUE: Gray-scale sonography with compression, as well as color and duplex ultrasound, were performed to evaluate the deep venous system(s) from the level of the common femoral vein through the popliteal and proximal calf veins.  COMPARISON:  10/26/2016  FINDINGS: VENOUS  Normal compressibility of the common femoral, superficial femoral, and popliteal veins, as well as the visualized calf veins. Visualized portions of profunda femoral vein and great saphenous vein unremarkable. No filling defects to suggest DVT on grayscale or color Doppler imaging. Doppler waveforms show normal direction of venous flow, normal respiratory plasticity and response to augmentation.  Limited views of the  contralateral common femoral vein are unremarkable.  OTHER  Popliteal fossa cyst measuring 4.2 x 2.6 x 1.2 cm.  Limitations: none  IMPRESSION: No evidence of left lower extremity DVT.  Left Baker's cyst.  Electronically Signed   By: Franky Crease M.D.   On: 06/20/2023 00:11 Note: Reviewed        Physical Exam  Vitals: BP (!) 145/85 (BP Location: Right Arm, Patient Position: Sitting)   Pulse 67   Temp (!) 97.4 F (36.3 C) (Temporal)   Resp 18   Ht 5' 1 (1.549 m)   Wt 178 lb (80.7 kg)   SpO2 100%   BMI 33.63 kg/m  BMI: Estimated body mass index is 33.63 kg/m as calculated from the following:   Height as of this encounter: 5' 1 (1.549 m).   Weight as of this encounter: 178 lb (80.7 kg). Ideal: Ideal body weight: 47.8 kg (105 lb 6.1 oz) Adjusted ideal body weight: 61 kg (134 lb 6.8 oz) General appearance: Well nourished, well developed, and well hydrated. In no apparent acute distress Mental status: Alert, oriented x 3 (person, place, & time)       Respiratory: No evidence of acute respiratory distress Eyes: PERLA  Musculoskeletal: +LBP  Assessment   Diagnosis Status  1. 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  Lumbar radicular pain   3. Lumbar radiculopathy   4. Medication management   5. Chronic pain syndrome    Controlled Controlled Controlled   Updated Problems: No problems updated.  Plan of Care  Problem-specific:  Assessment and Plan  Chronic pain syndrome: Patient's pain is well-controlled with oxycodone  and Butrans  patch, will continue on current medication regimen.  Prescribing drug monitoring (PDMP) reviewed, findings consistent with the use of prescribed medication and no evidence of narcotic misuse or abuse. Routine UDS ordered today.  The patient was advised to continue with the physical activity and walking for better functional mobility.  Schedule follow-up in 90 days for medication management.  Neuroforaminal stenosis of lumbar spine: Medications   buprenorphine  (BUTRANS ) 15 MCG/HR      Sig: Place 1 patch onto the skin once a week. For chronic pain syndrome      Dispense:  4 patch      Refill:  2   oxycodone  (OXY-IR) 5 MG capsule      Sig: Take 1 capsule (5 mg total) by mouth daily as needed.      Dispense:  30 capsule      Refill:  0    Ms. SHERMAINE RIVET has a current medication list which includes the following long-term medication(s): bupropion hcl, furosemide, hydrochlorothiazide, and sertraline.  Pharmacotherapy (Medications Ordered): Meds ordered this encounter  Medications   oxycodone  (OXY-IR) 5 MG capsule    Sig: Take 1 capsule (5 mg total) by mouth daily as needed.    Dispense:  30 capsule    Refill:  0   buprenorphine  (BUTRANS ) 15 MCG/HR    Sig: Place 1 patch onto the skin once a week. For chronic pain syndrome    Dispense:  4 patch    Refill:  2   Orders:  Orders Placed This Encounter  Procedures   [employer] Select 13 (MW), Urine    Volume: 30 ml(s). Minimum 3 ml of urine is needed. Document temperature of fresh sample. Indications: Long term (current) use of opiate analgesic (S20.108)    Release to patient:   Immediate        Return in  about 3 months (around 03/26/2024) for (F2F), (MM), Emmy Blanch NP.    Recent Visits Date Type Provider Dept  10/01/23 Office Visit Triana Coover K, NP [facility]  Showing recent visits within past 90 days and meeting all other requirements Today's Visits Date Type Provider Dept  12/25/23 Office Visit Ahlaya Ende K, NP [facility]  Showing today's visits and meeting all other requirements Future Appointments Date Type Provider Dept  03/17/24 Appointment Mahalie Kanner K, NP [facility]  Showing future appointments within next 90 days and meeting all other requirements  I discussed the assessment and treatment plan with the patient. The patient was provided an opportunity to ask questions and all were answered. The patient agreed with the plan and demonstrated an understanding of the instructions.  Patient advised to call back or seek an in-person evaluation if the symptoms or condition worsens.  Duration of encounter: 30 minutes.  Total time on encounter, as per AMA guidelines included both the face-to-face and non-face-to-face time personally spent by the physician and/or other qualified health care professional(s) on the day of the encounter (includes time in activities that require the physician or other qualified health care professional and does not include time in activities normally performed by clinical staff). Physician's time may include the following activities when performed: Preparing to see the patient (e.g., pre-charting review of records, searching for previously ordered imaging, lab work, and nerve conduction tests) Review of prior analgesic pharmacotherapies. Reviewing PMP Interpreting ordered tests (e.g., lab work, imaging, nerve conduction tests) Performing post-procedure evaluations, including interpretation of diagnostic procedures Obtaining and/or reviewing separately obtained history Performing a medically appropriate examination and/or  evaluation Counseling and educating the patient/family/caregiver Ordering medications, tests, or procedures Referring and communicating with other health care professionals (when not separately reported) Documenting clinical information in the electronic or other health record Independently interpreting results (not separately reported) and communicating results to the patient/ family/caregiver Care coordination (not separately reported)  Note by: Anslee Micheletti K Jocob Dambach, NP (TTS and AI technology used. I apologize for any typographical errors that were not detected and corrected.) Date: 12/25/2023; Time: 12:52 PM

## 2023-12-31 LAB — TOXASSURE SELECT 13 (MW), URINE

## 2024-03-15 NOTE — Progress Notes (Unsigned)
 PROVIDER NOTE: Interpretation of information contained herein should be left to medically-trained personnel. Specific patient instructions are provided elsewhere under Patient Instructions section of medical record. This document was created in part using AI and STT-dictation technology, any transcriptional errors that may result from this process are unintentional.  Patient: Leslie Carrillo  Service: E/M   PCP: Sherial Bail, MD  DOB: 07-Jan-1961  DOS: 03/17/2024  Provider: Emmy MARLA Blanch, NP  MRN: [id number]  Delivery: Face-to-face  Specialty: Interventional Pain Management  Type: Established Patient  Setting: Ambulatory outpatient facility  Specialty designation: 09  Referring Prov.: Sherial Bail, MD  Location: Outpatient office facility       History of present illness (HPI) [name], a 63 y.o. year old female, is here today because of her low back pain. 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  Pain Assessment: Severity of Chronic pain is reported as a 5 /10. Location: Back Lower/Into hips and bilateral legs. Onset: More than a month ago. Quality: Aching, Sharp. Timing: Constant. Modifying factor(s): Pain medication. Vitals:  height is 5' 1 (1.549 m) and weight is 177 lb (80.3 kg). Her temporal temperature is 97 F (36.1 C)  (abnormal). Her blood pressure is 144/83 (abnormal) and her pulse is 69. Her respiration is 18 and oxygen saturation is 100%.  BMI: Estimated body mass index is 33.44 kg/m as calculated from the following:   Height as of this encounter: 5' 1 (1.549 m).   Weight as of this encounter: 177 lb (80.3 kg).  Last encounter: 12/25/2023. Last procedure: Visit date not found.  Reason for encounter: medication management. No change in medical history since last visit.  Patient's pain is at baseline.  Patient continues multimodal pain regimen as prescribed.  States that it provides pain relief and improvement in functional status.   Discussed the use of AI scribe software for clinical note transcription with the patient, who gave verbal consent to proceed.  History of Present Illness   Leslie Carrillo is a 63 year old female who presents with worsening back pain and decreased hand grip strength.  She has experienced three falls since her last visit on October 8th, with two falls onto concrete and one involving a microwave. There is significant worsening of her back pain, particularly in the lumbar region, and her hips are also affected. The back pain is impacting her daily activities, including difficulty walking and getting up in the morning. No recent lumbar x-ray has been performed.  She describes numbness and a 'prickly feeling' in her left hand, which has persisted since her recent falls. She had surgery on her right hand for carpal tunnel syndrome approximately fifteen years ago, but the left hand has not been operated on. There is a significant decrease in grip strength and dexterity in her left hand, leading to frequent dropping of objects. This issue has persisted for about two months since her falls.  She mentions a history of osteoarthritis and autoimmune issues, though she has not  had recent lab work to evaluate these conditions. She notes a family history of blood-related issues, as her father  had similar problems. She has not seen a neurologist or had a recent evaluation for her carpal tunnel syndrome.  She is currently taking Zoloft and Wellbutrin, and she uses oxycodone  as needed for pain management, typically stretching a prescription over three months. There is increased use of oxycodone  following her falls due to heightened pain and activity demands during the holidays. She also uses a Butrans  patch for pain management.  In the review of symptoms, she reports dizziness and a general sense of being 'wobbly'. No recent head injuries or emergency room visits following her falls.     Pharmacotherapy Assessment   Buprenorphine  (Butrans ) 15 mcg/hr, 1 patch onto the skin once a week Oxycodone  (Oxy-IR) 5 mg capsule every 12 as needed for pain. MME=7.50 Monitoring: Leslie Carrillo: PDMP reviewed during this encounter.       Pharmacotherapy: No side-effects or adverse reactions reported. Compliance: No problems identified. Effectiveness: Clinically acceptable.  Leslie Carrillo, [place]  03/17/2024 11:05 AM  Sign when Signing Visit Nursing Pain Medication Assessment:  Safety precautions to be maintained throughout the outpatient stay will include: orient to surroundings, keep bed in low position, maintain call bell within reach at all times, provide assistance with transfer out of bed and ambulation.  Medication Inspection Compliance: Pill count conducted under aseptic conditions, in front of the patient. Neither the pills nor the bottle was removed from the patient's sight at any time. Once count was completed pills were immediately returned to the patient in their original bottle.  Medication: Oxycodone  IR Pill/Patch Count: 0 of 30 pills/patches remain Pill/Patch Appearance: Markings consistent with prescribed medication Bottle Appearance: Standard pharmacy container. Clearly labeled. Filled Date: 10 / 08 / 2025 Last Medication intake:  Today  Leslie Carrillo, Leslie Carrillo  03/17/2024 11:05 AM  Sign  when Signing Visit Nursing Pain Medication Assessment:  Safety precautions to be maintained throughout the outpatient stay will include: orient to surroundings, keep bed in low position, maintain call bell within reach at all times, provide assistance with transfer out of bed and ambulation.  Medication Inspection Compliance: Pill count conducted under aseptic conditions, in front of the patient. Neither the pills nor the bottle was removed from the patient's sight at any time. Once count was completed pills were immediately returned to the patient in their original bottle.  Medication: Buprenorphine  patch (Butrans ) Pill/Patch Count: 0 of 4 pills/patches remain Pill/Patch Appearance: Markings consistent with prescribed medication Bottle Appearance: Standard pharmacy container. Clearly labeled. Filled Date: 47 / 01 / 2025 Last Medication intake:  03/12/24    UDS:  Summary  Date Value Ref Range Status  12/25/2023 FINAL  Final    Comment:    ==================================================================== [employer] Select 13 (MW) ==================================================================== Test                             Result       Flag       Units  Drug Present and Declared for Prescription Verification   Oxycodone                       1429         EXPECTED   ng/mg creat   Oxymorphone                    206  EXPECTED   ng/mg creat   Noroxycodone                   2186         EXPECTED   ng/mg creat   Noroxymorphone                 84           EXPECTED   ng/mg creat    Sources of oxycodone  are scheduled prescription medications.    Oxymorphone, noroxycodone, and noroxymorphone are expected    metabolites of oxycodone . Oxymorphone is also available as a    scheduled prescription medication.    Buprenorphine                   13           EXPECTED   ng/mg creat   Norbuprenorphine               16           EXPECTED   ng/mg creat    Source of buprenorphine  is a  scheduled prescription medication.    Norbuprenorphine is an expected metabolite of buprenorphine .  ==================================================================== Test                      Result    Flag   Units      Ref Range   Creatinine              79               mg/dL      >=79 ==================================================================== Declared Medications:  The flagging and interpretation on this report are based on the  following declared medications.  Unexpected results may arise from  inaccuracies in the declared medications.   **Note: The testing scope of this panel includes these medications:   Oxycodone    **Note: The testing scope of this panel does not include small to  moderate amounts of these reported medications:   Buprenorphine  Patch (BuTrans )   **Note: The testing scope of this panel does not include the  following reported medications:   Acetaminophen  (Tylenol )  Bupropion  Furosemide (Lasix)  Hydrochlorothiazide (Hydrodiuril)  Hydroxyzine (Atarax)  Multivitamin  Sertraline (Zoloft)  Topical ==================================================================== For clinical consultation, please call [phone]. ====================================================================     No results found for: CBDTHCR No results found for: D8THCCBX No results found for: D9THCCBX  ROS  Constitutional: Denies any fever or chills Gastrointestinal: No reported hemesis, hematochezia, vomiting, or acute GI distress Musculoskeletal: Low back pain Neurological: No reported episodes of acute onset apraxia, aphasia, dysarthria, agnosia, amnesia, paralysis, loss of coordination, or loss of consciousness  Medication Review  Clobetasol  Propionate, acetaminophen , buPROPion HCl, buprenorphine , furosemide, hydrOXYzine, hydrochlorothiazide, multivitamin, oxycodone , and sertraline  History Review  Allergy: Leslie Carrillo is allergic to codeine,  pregabalin, hydrocodone -acetaminophen , tramadol , latex, lovastatin, and simvastatin. Drug: Leslie Carrillo  reports no history of drug use. Alcohol:  reports no history of alcohol use. Tobacco:  reports that she has been smoking cigarettes. She has a 10 pack-year smoking history. She has never used smokeless tobacco. Social: Leslie Carrillo  reports that she has been smoking cigarettes. She has a 10 pack-year smoking history. She has never used smokeless tobacco. She reports that she does not drink alcohol and does not use drugs. Medical:  has a past medical history of Anemia, Arthritis, Asthma, Chronic kidney disease, Chronic pain, and History of kidney stones. Surgical: Leslie Carrillo  has a past surgical history that includes Cervical fusion (2014); carpel tunnel (Right); Cholecystectomy; c sections (N/A, 1985); Colonoscopy; and Shoulder arthroscopy with subacromial decompression, rotator cuff repair and bicep tendon repair (Right, 11/08/2020). Family: family history includes Dementia in her mother; Prostate cancer in her father; Renal Cyst in her father.  Laboratory Chemistry Profile   Renal Lab Results  Component Value Date   BUN 11 11/08/2020   CREATININE 0.97 11/08/2020   GFRAA >60 10/16/2019   GFRNONAA >60 11/08/2020    Hepatic Lab Results  Component Value Date   AST 20 10/16/2019   ALT 15 10/16/2019   ALBUMIN 4.0 10/16/2019   ALKPHOS 75 10/16/2019   LIPASE 29 10/16/2019    Electrolytes Lab Results  Component Value Date   NA 137 11/08/2020   K 3.9 11/08/2020   CL 102 11/08/2020   CALCIUM 9.0 11/08/2020    Bone No results found for: VD25OH, VD125OH2TOT, CI6874NY7, CI7874NY7, 25OHVITD1, 25OHVITD2, 25OHVITD3, TESTOFREE, TESTOSTERONE  Inflammation (CRP: Acute Phase) (ESR: Chronic Phase) No results found for: CRP, ESRSEDRATE, LATICACIDVEN       Note: Above Lab results reviewed.  Recent Imaging Review  US  Venous Img Lower Unilateral Left (DVT) CLINICAL DATA:  Left  lower extremity swelling, pain  EXAM: LEFT LOWER EXTREMITY VENOUS DOPPLER ULTRASOUND  TECHNIQUE: Gray-scale sonography with compression, as well as color and duplex ultrasound, were performed to evaluate the deep venous system(s) from the level of the common femoral vein through the popliteal and proximal calf veins.  COMPARISON:  10/26/2016  FINDINGS: VENOUS  Normal compressibility of the common femoral, superficial femoral, and popliteal veins, as well as the visualized calf veins. Visualized portions of profunda femoral vein and great saphenous vein unremarkable. No filling defects to suggest DVT on grayscale or color Doppler imaging. Doppler waveforms show normal direction of venous flow, normal respiratory plasticity and response to augmentation.  Limited views of the contralateral common femoral vein are unremarkable.  OTHER  Popliteal fossa cyst measuring 4.2 x 2.6 x 1.2 cm.  Limitations: none  IMPRESSION: No evidence of left lower extremity DVT.  Left Baker's cyst.  Electronically Signed   By: Franky Crease M.D.   On: 06/20/2023 00:11 Note: Reviewed        Physical Exam  Vitals: BP (!) 144/83 (BP Location: Right Arm, Patient Position: Sitting, Cuff Size: Normal)   Pulse 69   Temp (!) 97 F (36.1 C) (Temporal)   Resp 18   Ht 5' 1 (1.549 m)   Wt 177 lb (80.3 kg)   SpO2 100%   BMI 33.44 kg/m  BMI: Estimated body mass index is 33.44 kg/m as calculated from the following:   Height as of this encounter: 5' 1 (1.549 m).   Weight as of this encounter: 177 lb (80.3 kg). Ideal: Ideal body weight: 47.8 kg (105 lb 6.1 oz) Adjusted ideal body weight: 60.8 kg (134 lb 0.5 oz) General appearance: Well nourished, well developed, and well hydrated. In no apparent acute distress Mental status: Alert, oriented x 3 (person, place, & time)       Respiratory: No evidence of acute respiratory distress Eyes: PERLA  Musculoskeletal: +LBP  Assessment   Diagnosis  Status  1. Lumbar radiculopathy   2. Medication management   3. 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 Lumbar radicular pain   5. Chronic pain syndrome   6. Lumbar spondylosis   7. Lumbar facet arthropathy    Controlled Controlled Controlled   Updated Problems:  No problems updated.  Plan of Care  Problem-specific:  Assessment and Plan    Chronic low back pain with lumbar radiculopathy Severe pain affecting mobility and daily activities, exacerbated by recent falls. Previous lumbar facet block in 2024 provided temporary relief but was intolerable. Current management includes oxycodone  and Butrans  patch. - Prescribed oxycodone  60 tablets for three months, to be taken as needed, with instructions to stretch out usage over 90 days. - Continue Butrans  patch with two refills. - Discuss with primary care provider about potential CT scan or referral to neurology for further evaluation. - Encouraged discussion with primary care provider regarding medication interactions and potential referral to orthopedics for carpal tunnel evaluation.  Chronic pain syndrome Significant impact on quality of life with difficulty in grip strength and mobility. Recent falls exacerbated symptoms, particularly in the left hand. Differential diagnosis includes carpal tunnel syndrome and possible neurological issues. - Discuss with primary care provider about potential referral to neurology for evaluation of grip strength and possible neurological issues. - Discuss with primary care provider about potential referral to orthopedics for evaluation of carpal tunnel syndrome. - Encouraged discussion with primary care provider regarding medication interactions and potential adjustments.   Medication management: Patient's pain is controlled with oxycodone  and Butrans  patch, will continue on current medication regimen.  Prescribing drug monitoring (PDMP) reviewed, findings consistent with the use of  prescribed medication and no evidence of narcotic misuse or abuse.  Urine drug screening (UDS) up to date.  No side effects or adverse reaction reported to medication. The patient was advised to continue with the physical activity and walking for better functional mobility.  Schedule follow-up in 90 days for medication management.       Leslie Carrillo has a current medication list which includes the following long-term medication(s): bupropion hcl, furosemide, hydrochlorothiazide, and sertraline.  Pharmacotherapy (Medications Ordered): Meds ordered this encounter  Medications   buprenorphine  (BUTRANS ) 15 MCG/HR    Sig: Place 1 patch onto the skin once a week. For chronic pain syndrome    Dispense:  4 patch    Refill:  2   oxycodone  (OXY-IR) 5 MG capsule    Sig: Take 1 capsule (5 mg total) by mouth every 12 (twelve) hours as needed.    Dispense:  60 capsule    Refill:  0   Orders:  No orders of the defined types were placed in this encounter.       Return in about 3 months (around 06/15/2024) for (F2F), (MM), Emmy Blanch NP.    Recent Visits Date Type Provider Dept  12/25/23 Office Visit Teegan Guinther K, NP [facility]  Showing recent visits within past 90 days and meeting all other requirements Today's Visits Date Type Provider Dept  03/17/24 Office Visit Davona Kinoshita K, NP [facility]  Showing today's visits and meeting all other requirements Future Appointments Date Type Provider Dept  06/09/24 Appointment Tishia Maestre K, NP [facility]  Showing future appointments within next 90 days and meeting all other requirements  I discussed the assessment and treatment plan with the patient. The patient was provided an opportunity to ask questions and all were answered. The patient agreed with the plan and demonstrated an understanding of the instructions.  Patient advised to call back or seek an in-person evaluation if the symptoms or condition  worsens.  I personally spent a total of 30 minutes in the care of the patient today including preparing to see the patient, getting/reviewing  separately obtained history, performing a medically appropriate exam/evaluation, counseling and educating, placing orders, referring and communicating with other health care professionals, documenting clinical information in the EHR, independently interpreting results, communicating results, and coordinating care.   Note by: Alberto Schoch K Ariell Gunnels, NP (TTS and AI technology used. I apologize for any typographical errors that were not detected and corrected.) Date: 03/17/2024; Time: 12:09 PM

## 2024-03-17 ENCOUNTER — Encounter: Payer: Self-pay | Admitting: Nurse Practitioner

## 2024-03-17 ENCOUNTER — Ambulatory Visit: Attending: Nurse Practitioner | Admitting: Nurse Practitioner

## 2024-03-17 VITALS — BP 144/83 | HR 69 | Temp 97.0°F | Resp 18 | Ht 61.0 in | Wt 177.0 lb

## 2024-03-17 DIAGNOSIS — M48061 Spinal stenosis, lumbar region without neurogenic claudication: Secondary | ICD-10-CM | POA: Insufficient documentation

## 2024-03-17 DIAGNOSIS — Z79899 Other long term (current) drug therapy: Secondary | ICD-10-CM | POA: Insufficient documentation

## 2024-03-17 DIAGNOSIS — G894 Chronic pain syndrome: Secondary | ICD-10-CM | POA: Diagnosis not present

## 2024-03-17 DIAGNOSIS — M5416 Radiculopathy, lumbar region: Secondary | ICD-10-CM | POA: Insufficient documentation

## 2024-03-17 DIAGNOSIS — M47816 Spondylosis without myelopathy or radiculopathy, lumbar region: Secondary | ICD-10-CM | POA: Diagnosis not present

## 2024-03-17 MED ORDER — BUPRENORPHINE 15 MCG/HR TD PTWK: 1.0000 | MEDICATED_PATCH | TRANSDERMAL | 2 refills | Status: AC

## 2024-03-17 MED ORDER — OXYCODONE HCL 5 MG PO CAPS: 5.0000 mg | ORAL_CAPSULE | Freq: Two times a day (BID) | ORAL | 0 refills | Status: AC | PRN

## 2024-03-17 NOTE — Patient Instructions (Signed)

## 2024-03-17 NOTE — Progress Notes (Signed)
 Nursing Pain Medication Assessment:  Safety precautions to be maintained throughout the outpatient stay will include: orient to surroundings, keep bed in low position, maintain call bell within reach at all times, provide assistance with transfer out of bed and ambulation.  Medication Inspection Compliance: Pill count conducted under aseptic conditions, in front of the patient. Neither the pills nor the bottle was removed from the patient's sight at any time. Once count was completed pills were immediately returned to the patient in their original bottle.  Medication: Oxycodone  IR Pill/Patch Count: 0 of 30 pills/patches remain Pill/Patch Appearance: Markings consistent with prescribed medication Bottle Appearance: Standard pharmacy container. Clearly labeled. Filled Date: 10 / 08 / 2025 Last Medication intake:  Today

## 2024-03-17 NOTE — Progress Notes (Signed)
 Nursing Pain Medication Assessment:  Safety precautions to be maintained throughout the outpatient stay will include: orient to surroundings, keep bed in low position, maintain call bell within reach at all times, provide assistance with transfer out of bed and ambulation.  Medication Inspection Compliance: Pill count conducted under aseptic conditions, in front of the patient. Neither the pills nor the bottle was removed from the patient's sight at any time. Once count was completed pills were immediately returned to the patient in their original bottle.  Medication: Buprenorphine  patch (Butrans ) Pill/Patch Count: 0 of 4 pills/patches remain Pill/Patch Appearance: Markings consistent with prescribed medication Bottle Appearance: Standard pharmacy container. Clearly labeled. Filled Date: 2 / 01 / 2025 Last Medication intake:  03/12/24

## 2024-06-09 ENCOUNTER — Encounter: Admitting: Nurse Practitioner
